# Patient Record
Sex: Female | Born: 1950 | Race: White | Hispanic: No | Marital: Married | Smoking: Former smoker
Health system: Southern US, Community
[De-identification: ages and names within clinical notes are randomized; demographics above are authoritative.]

## PROBLEM LIST (undated history)

## (undated) DIAGNOSIS — R42 Dizziness and giddiness: Secondary | ICD-10-CM

## (undated) DIAGNOSIS — Z8489 Family history of other specified conditions: Secondary | ICD-10-CM

## (undated) DIAGNOSIS — I10 Essential (primary) hypertension: Secondary | ICD-10-CM

## (undated) DIAGNOSIS — E119 Type 2 diabetes mellitus without complications: Secondary | ICD-10-CM

## (undated) DIAGNOSIS — J449 Chronic obstructive pulmonary disease, unspecified: Secondary | ICD-10-CM

## (undated) HISTORY — PX: BLEPHAROPLASTY: SUR158

## (undated) HISTORY — PX: ABDOMINAL HYSTERECTOMY: SHX81

## (undated) HISTORY — PX: BACK SURGERY: SHX140

## (undated) HISTORY — PX: CATARACT EXTRACTION W/ INTRAOCULAR LENS  IMPLANT, BILATERAL: SHX1307

## (undated) HISTORY — PX: BREAST SURGERY: SHX581

## (undated) HISTORY — PX: HIP SURGERY: SHX245

---

## 2004-01-31 ENCOUNTER — Ambulatory Visit: Payer: Self-pay | Admitting: Unknown Physician Specialty

## 2004-11-01 ENCOUNTER — Ambulatory Visit: Payer: Self-pay | Admitting: General Practice

## 2005-03-11 ENCOUNTER — Ambulatory Visit: Payer: Self-pay | Admitting: General Practice

## 2005-11-19 ENCOUNTER — Ambulatory Visit: Payer: Self-pay | Admitting: Internal Medicine

## 2006-03-24 ENCOUNTER — Ambulatory Visit: Payer: Self-pay | Admitting: General Practice

## 2006-03-31 ENCOUNTER — Ambulatory Visit: Payer: Self-pay | Admitting: General Practice

## 2007-03-30 ENCOUNTER — Ambulatory Visit: Payer: Self-pay | Admitting: Internal Medicine

## 2008-03-31 ENCOUNTER — Ambulatory Visit: Payer: Self-pay | Admitting: Internal Medicine

## 2008-10-02 ENCOUNTER — Ambulatory Visit: Payer: Self-pay | Admitting: Unknown Physician Specialty

## 2008-11-02 ENCOUNTER — Ambulatory Visit: Payer: Self-pay | Admitting: Unknown Physician Specialty

## 2009-04-02 ENCOUNTER — Ambulatory Visit: Payer: Self-pay | Admitting: Internal Medicine

## 2010-04-04 ENCOUNTER — Ambulatory Visit: Payer: Self-pay | Admitting: Internal Medicine

## 2011-11-14 ENCOUNTER — Ambulatory Visit: Payer: Self-pay | Admitting: Anesthesiology

## 2011-11-18 ENCOUNTER — Ambulatory Visit: Payer: Self-pay

## 2012-03-18 ENCOUNTER — Ambulatory Visit: Payer: Self-pay | Admitting: Internal Medicine

## 2012-03-24 ENCOUNTER — Ambulatory Visit: Payer: Self-pay | Admitting: Ophthalmology

## 2012-04-21 ENCOUNTER — Ambulatory Visit: Payer: Self-pay | Admitting: Ophthalmology

## 2013-03-29 ENCOUNTER — Ambulatory Visit: Payer: Self-pay | Admitting: Internal Medicine

## 2014-04-04 ENCOUNTER — Ambulatory Visit: Payer: Self-pay | Admitting: Internal Medicine

## 2016-07-01 ENCOUNTER — Telehealth: Payer: Self-pay

## 2016-07-01 ENCOUNTER — Other Ambulatory Visit: Payer: Self-pay

## 2016-07-01 DIAGNOSIS — Z86010 Personal history of colonic polyps: Secondary | ICD-10-CM

## 2016-07-01 NOTE — Telephone Encounter (Signed)
Gastroenterology Pre-Procedure Review  Request Date: 07/14/16 Requesting Physician: Dr. Allen Norris  PATIENT REVIEW QUESTIONS: The patient responded to the following health history questions as indicated:    1. Are you having any GI issues? no 2. Do you have a personal history of Polyps? yes (removed) 3. Do you have a family history of Colon Cancer or Polyps? no 4. Diabetes Mellitus? yes (Type II) 5. Joint replacements in the past 12 months?no 6. Major health problems in the past 3 months?no 7. Any artificial heart valves, MVP, or defibrillator?no    MEDICATIONS & ALLERGIES:    Patient reports the following regarding taking any anticoagulation/antiplatelet therapy:   Plavix, Coumadin, Eliquis, Xarelto, Lovenox, Pradaxa, Brilinta, or Effient? no Aspirin? no  Patient confirms/reports the following medications:  Current Outpatient Prescriptions  Medication Sig Dispense Refill  . alendronate (FOSAMAX) 70 MG tablet TAKE 1 TABLET (70 MG TOTAL) BY MOUTH EVERY 7 (SEVEN) DAYS.    Marland Kitchen ALPRAZolam (XANAX) 0.5 MG tablet Take by mouth.    . fluticasone (FLONASE) 50 MCG/ACT nasal spray Place into the nose.    Marland Kitchen Fluticasone-Salmeterol (ADVAIR DISKUS) 250-50 MCG/DOSE AEPB Inhale into the lungs.    . Fluticasone-Salmeterol 113-14 MCG/ACT AEPB Inhale into the lungs.    . Ipratropium-Albuterol (COMBIVENT RESPIMAT) 20-100 MCG/ACT AERS respimat INHALE 1 INHALATION INTO THE LUNGS 4 (FOUR) TIMES DAILY AS NEEDED FOR WHEEZING.    Marland Kitchen ipratropium-albuterol (DUONEB) 0.5-2.5 (3) MG/3ML SOLN Inhale into the lungs.    . metFORMIN (GLUCOPHAGE) 1000 MG tablet TAKE 1 TABLET BY MOUTH TWICE A DAY    . tiotropium (SPIRIVA) 18 MCG inhalation capsule Place into inhaler and inhale.    . valsartan-hydrochlorothiazide (DIOVAN-HCT) 160-12.5 MG tablet TAKE 1 TABLET BY MOUTH ONCE A DAY     No current facility-administered medications for this visit.     Patient confirms/reports the following allergies:  Allergies  Allergen Reactions   . Sulfamethoxazole-Trimethoprim Other (See Comments)    Sulfa causes tingling  . Morphine     Other reaction(s): Other (See Comments) Morphine causes mind altering.  . Oxycodone-Acetaminophen     Other reaction(s): Other (See Comments) Percocet causes mind altering.  . Sertraline Other (See Comments)    Sertraline causes worsening depression.    No orders of the defined types were placed in this encounter.   AUTHORIZATION INFORMATION Primary Insurance: 1D#: Group #:  Secondary Insurance: 1D#: Group #:  SCHEDULE INFORMATION: Date: 07/14/16 Time: Location: [place]

## 2016-07-08 ENCOUNTER — Encounter: Payer: Self-pay | Admitting: *Deleted

## 2016-07-10 NOTE — Discharge Instructions (Signed)

## 2016-07-14 ENCOUNTER — Ambulatory Visit: Payer: Medicare Other | Admitting: Anesthesiology

## 2016-07-14 ENCOUNTER — Ambulatory Visit
Admission: RE | Admit: 2016-07-14 | Discharge: 2016-07-14 | Disposition: A | Discharge status: Home / Self Care | Payer: Medicare Other | Source: Ambulatory Visit | Attending: Gastroenterology | Admitting: Gastroenterology

## 2016-07-14 ENCOUNTER — Encounter
Admission: RE | Disposition: A | Discharge status: Home / Self Care | Payer: Self-pay | Source: Ambulatory Visit | Attending: Gastroenterology

## 2016-07-14 DIAGNOSIS — Z7984 Long term (current) use of oral hypoglycemic drugs: Secondary | ICD-10-CM | POA: Insufficient documentation

## 2016-07-14 DIAGNOSIS — Z860100 Personal history of colon polyps, unspecified: Secondary | ICD-10-CM

## 2016-07-14 DIAGNOSIS — Z79899 Other long term (current) drug therapy: Secondary | ICD-10-CM | POA: Diagnosis not present

## 2016-07-14 DIAGNOSIS — Z1211 Encounter for screening for malignant neoplasm of colon: Secondary | ICD-10-CM | POA: Diagnosis not present

## 2016-07-14 DIAGNOSIS — D127 Benign neoplasm of rectosigmoid junction: Secondary | ICD-10-CM

## 2016-07-14 DIAGNOSIS — K5730 Diverticulosis of large intestine without perforation or abscess without bleeding: Secondary | ICD-10-CM | POA: Insufficient documentation

## 2016-07-14 DIAGNOSIS — J449 Chronic obstructive pulmonary disease, unspecified: Secondary | ICD-10-CM | POA: Insufficient documentation

## 2016-07-14 DIAGNOSIS — E119 Type 2 diabetes mellitus without complications: Secondary | ICD-10-CM | POA: Diagnosis not present

## 2016-07-14 DIAGNOSIS — Z87891 Personal history of nicotine dependence: Secondary | ICD-10-CM | POA: Diagnosis not present

## 2016-07-14 DIAGNOSIS — I10 Essential (primary) hypertension: Secondary | ICD-10-CM | POA: Insufficient documentation

## 2016-07-14 DIAGNOSIS — Z9981 Dependence on supplemental oxygen: Secondary | ICD-10-CM | POA: Diagnosis not present

## 2016-07-14 HISTORY — DX: Essential (primary) hypertension: I10

## 2016-07-14 HISTORY — PX: POLYPECTOMY: SHX5525

## 2016-07-14 HISTORY — DX: Chronic obstructive pulmonary disease, unspecified: J44.9

## 2016-07-14 HISTORY — DX: Family history of other specified conditions: Z84.89

## 2016-07-14 HISTORY — PX: COLONOSCOPY WITH PROPOFOL: SHX5780

## 2016-07-14 HISTORY — DX: Type 2 diabetes mellitus without complications: E11.9

## 2016-07-14 HISTORY — DX: Dizziness and giddiness: R42

## 2016-07-14 LAB — GLUCOSE, CAPILLARY
Glucose-Capillary: 135 mg/dL — ABNORMAL HIGH (ref 65–99)
Glucose-Capillary: 132 mg/dL — ABNORMAL HIGH (ref 65–99)

## 2016-07-14 SURGERY — COLONOSCOPY WITH PROPOFOL
Anesthesia: Monitor Anesthesia Care | Wound class: Contaminated

## 2016-07-14 MED ORDER — ONDANSETRON HCL 4 MG/2ML IJ SOLN: 4.0000 mg | Freq: Once | INTRAMUSCULAR | Status: DC | PRN

## 2016-07-14 MED ORDER — PROPOFOL 10 MG/ML IV BOLUS
INTRAVENOUS | Status: DC | PRN
  Administered 2016-07-14: 10 mg via INTRAVENOUS
  Administered 2016-07-14 (×5): 20 mg via INTRAVENOUS
  Administered 2016-07-14: 10 mg via INTRAVENOUS
  Administered 2016-07-14: 40 mg via INTRAVENOUS
  Administered 2016-07-14 (×2): 20 mg via INTRAVENOUS

## 2016-07-14 MED ORDER — LACTATED RINGERS IV SOLN
INTRAVENOUS | Status: DC | PRN
  Administered 2016-07-14: 10:00:00 via INTRAVENOUS

## 2016-07-14 MED ORDER — LIDOCAINE HCL (CARDIAC) 20 MG/ML IV SOLN
INTRAVENOUS | Status: DC | PRN
  Administered 2016-07-14: 40 mg via INTRAVENOUS

## 2016-07-14 MED ORDER — STERILE WATER FOR IRRIGATION IR SOLN
Status: DC | PRN
  Administered 2016-07-14: 10:00:00

## 2016-07-14 SURGICAL SUPPLY — 23 items

## 2016-07-14 NOTE — Op Note (Signed)
[facility] Gastroenterology Patient Name: Belinda Jenkins Procedure Date: 07/14/2016 10:00 AM MRN: [id number] Account #: [id number] Date of Birth: 12-Sep-1950 Admit Type: Outpatient Age: 66 Room: [facility] OR ROOM 01 Gender: Female Note Status: Finalized Procedure:            Colonoscopy Indications:          High risk colon cancer surveillance: Personal history                        of colonic polyps Providers:            Lucilla Lame MD, MD Referring MD:         Halina Maidens, MD (Referring MD) Medicines:            Propofol per Anesthesia Complications:        No immediate complications. Procedure:            Pre-Anesthesia Assessment:                       - Prior to the procedure, a History and Physical was                        performed, and patient medications and allergies were                        reviewed. The patient's tolerance of previous                        anesthesia was also reviewed. The risks and benefits of                        the procedure and the sedation options and risks were                        discussed with the patient. All questions were                        answered, and informed consent was obtained. Prior                        Anticoagulants: The patient has taken no previous                        anticoagulant or antiplatelet agents. ASA Grade                        Assessment: II - A patient with mild systemic disease.                        After reviewing the risks and benefits, the patient was                        deemed in satisfactory condition to undergo the                        procedure.                       After obtaining informed consent, the colonoscope was  passed under direct vision. Throughout the procedure,                        the patient's blood pressure, pulse, and oxygen                        saturations were monitored continuously. The Olympus CF   H180AL Colonoscope (S#: [id number]) was introduced through                        the anus and advanced to the the cecum, identified by                        appendiceal orifice and ileocecal valve. The                        colonoscopy was performed without difficulty. The                        patient tolerated the procedure well. The quality of                        the bowel preparation was excellent. Findings:      The perianal and digital rectal examinations were normal.      Four sessile polyps were found in the recto-sigmoid colon. The polyps       were 2 to 3 mm in size. These polyps were removed with a cold biopsy       forceps. Resection and retrieval were complete.      Multiple small-mouthed diverticula were found in the sigmoid colon. Impression:           - Four 2 to 3 mm polyps at the recto-sigmoid colon,                        removed with a cold biopsy forceps. Resected and                        retrieved.                       - Diverticulosis in the sigmoid colon. Recommendation:       - Discharge patient to home.                       - Resume previous diet.                       - Continue present medications.                       - Await pathology results.                       - Repeat colonoscopy in 5 years for surveillance. Procedure Code(s):    --- Professional ---                       [phone], Colonoscopy, flexible; with biopsy, single or                        multiple Diagnosis Code(s):    --- Professional ---  Z86.010, Personal history of colonic polyps                       D12.7, Benign neoplasm of rectosigmoid junction CPT copyright 2016 American Medical Association. All rights reserved. The codes documented in this report are preliminary and upon coder review may  be revised to meet current compliance requirements. Lucilla Lame MD, MD 07/14/2016 10:30:07 AM This report has been signed electronically. Number of Addenda: 0 Note  Initiated On: 07/14/2016 10:00 AM Scope Withdrawal Time: 0 hours 6 minutes 22 seconds  Total Procedure Duration: 0 hours 13 minutes 56 seconds       [facility]

## 2016-07-14 NOTE — H&P (Signed)
Lucilla Lame, MD [address] [place], [place] [address] Phone:[phone] Fax : [phone]  Primary Care Physician:  No PCP Per Patient Primary Gastroenterologist:  Dr. Allen Norris  Pre-Procedure History & Physical: HPI:  Belinda Jenkins is a 66 y.o. female is here for an colonoscopy.   Past Medical History:  Diagnosis Date  . COPD (chronic obstructive pulmonary disease) ([place])   . Diabetes mellitus without complication ([place])   . Family history of adverse reaction to anesthesia    sister - slow to wake after 1 procedure  . Hypertension   . Vertigo     Past Surgical History:  Procedure Laterality Date  . ABDOMINAL HYSTERECTOMY    . BACK SURGERY     L5  . BLEPHAROPLASTY Bilateral   . BREAST SURGERY     lumpectomy(x1), milk duct(x1)  . CATARACT EXTRACTION W/ INTRAOCULAR LENS  IMPLANT, BILATERAL    . HIP SURGERY Left     Prior to Admission medications   Medication Sig Start Date End Date Taking? Authorizing Provider  alendronate (FOSAMAX) 70 MG tablet TAKE 1 TABLET (70 MG TOTAL) BY MOUTH EVERY 7 (SEVEN) DAYS. 05/21/15  Yes Historical Provider, MD  ALPRAZolam Duanne Moron) 0.5 MG tablet Take by mouth. 05/29/16  Yes Historical Provider, MD  fluticasone (FLONASE) 50 MCG/ACT nasal spray Place into the nose. 03/28/16 03/28/17 Yes Historical Provider, MD  Fluticasone-Salmeterol (ADVAIR DISKUS) 250-50 MCG/DOSE AEPB Inhale into the lungs. 12/06/15  Yes Historical Provider, MD  Ipratropium-Albuterol (COMBIVENT RESPIMAT) 20-100 MCG/ACT AERS respimat INHALE 1 INHALATION INTO THE LUNGS 4 (FOUR) TIMES DAILY AS NEEDED FOR WHEEZING. 11/29/15  Yes Historical Provider, MD  ipratropium-albuterol (DUONEB) 0.5-2.5 (3) MG/3ML SOLN Inhale into the lungs. 09/25/15  Yes Historical Provider, MD  metFORMIN (GLUCOPHAGE) 1000 MG tablet TAKE 1 TABLET BY MOUTH TWICE A DAY 09/19/15  Yes Historical Provider, MD  OXYGEN Inhale 2 L into the lungs continuous.   Yes Historical Provider, MD  tiotropium (SPIRIVA)  18 MCG inhalation capsule Place into inhaler and inhale. 12/06/15  Yes Historical Provider, MD  valsartan-hydrochlorothiazide (DIOVAN-HCT) 160-12.5 MG tablet TAKE 1 TABLET BY MOUTH ONCE A DAY 10/17/15  Yes Historical Provider, MD    Allergies as of 07/01/2016 - never reviewed  Allergen Reaction Noted  . Sulfamethoxazole-trimethoprim Other (See Comments)   . Morphine  08/23/2013  . Oxycodone-acetaminophen  08/23/2013  . Sertraline Other (See Comments) 08/23/2013    History reviewed. No pertinent family history.  Social History   Social History  . Marital status: Married    Spouse name: N/A  . Number of children: N/A  . Years of education: N/A   Occupational History  . Not on file.   Social History Main Topics  . Smoking status: Former Smoker    Quit date: 04/15/2003  . Smokeless tobacco: Never Used  . Alcohol use 0.6 oz/week    1 Shots of liquor per week  . Drug use: Unknown  . Sexual activity: Not on file   Other Topics Concern  . Not on file   Social History Narrative  . No narrative on file    Review of Systems: See HPI, otherwise negative ROS  Physical Exam: BP 118/79   Pulse 89   Temp 98.1 F (36.7 C) (Temporal)   Resp 16   Ht 5\' 1"  (1.549 m)   Wt 153 lb (69.4 kg)   SpO2 100%   BMI 28.91 kg/m  General:   Alert,  pleasant and cooperative in NAD Head:  Normocephalic and  atraumatic. Neck:  Supple; no masses or thyromegaly. Lungs:  Diffuse rhonichi    Heart:  Regular rate and rhythm. Abdomen:  Soft, nontender and nondistended. Normal bowel sounds, without guarding, and without rebound.   Neurologic:  Alert and  oriented x4;  grossly normal neurologically.  Impression/Plan: Belinda Jenkins is here for an colonoscopy to be performed for histroy of polyps  Risks, benefits, limitations, and alternatives regarding  endoscopy have been reviewed with the patient.  Questions have been answered.  All parties agreeable.   Lucilla Lame, MD  07/14/2016, 9:34 AM

## 2016-07-14 NOTE — Anesthesia Preprocedure Evaluation (Addendum)
Anesthesia Evaluation  Patient identified by MRN, date of birth, ID band Patient awake    Reviewed: Allergy & Precautions, H&P , NPO status , Patient's Chart, lab work & pertinent test results, reviewed documented beta blocker date and time   Airway Mallampati: II  TM Distance: >3 FB     Dental no notable dental hx.    Pulmonary COPD,  oxygen dependent, former smoker,    breath sounds clear to auscultation       Cardiovascular hypertension, (-) angina Rhythm:regular     Neuro/Psych    GI/Hepatic negative GI ROS,   Endo/Other  diabetes, Type 2, Oral Hypoglycemic Agents  Renal/GU      Musculoskeletal   Abdominal   Peds  Hematology negative hematology ROS (+)   Anesthesia Other Findings Nasal cannula O2 in place  Reproductive/Obstetrics                           Anesthesia Physical Anesthesia Plan  ASA: III  Anesthesia Plan: MAC   Post-op Pain Management:    Induction:   Airway Management Planned:   Additional Equipment:   Intra-op Plan:   Post-operative Plan:   Informed Consent: I have reviewed the patients History and Physical, chart, labs and discussed the procedure including the risks, benefits and alternatives for the proposed anesthesia with the patient or authorized representative who has indicated his/her understanding and acceptance.     Plan Discussed with: CRNA  Anesthesia Plan Comments:       Anesthesia Quick Evaluation

## 2016-07-14 NOTE — Anesthesia Postprocedure Evaluation (Signed)
Anesthesia Post Note  Patient: Belinda Jenkins  Procedure(s) Performed: Procedure(s) (LRB): COLONOSCOPY WITH PROPOFOL (N/A) POLYPECTOMY  Patient location during evaluation: PACU Anesthesia Type: MAC Level of consciousness: awake and alert Pain management: pain level controlled Vital Signs Assessment: post-procedure vital signs reviewed and stable Respiratory status: spontaneous breathing, nonlabored ventilation, respiratory function stable and patient connected to nasal cannula oxygen Cardiovascular status: stable and blood pressure returned to baseline Anesthetic complications: no    Veda Canning

## 2016-07-14 NOTE — Anesthesia Procedure Notes (Signed)
Procedure Name: MAC Date/Time: 07/14/2016 10:09 AM Performed by: Janna Arch Pre-anesthesia Checklist: Patient identified, Emergency Drugs available, Suction available and Patient being monitored Patient Re-evaluated:Patient Re-evaluated prior to inductionOxygen Delivery Method: Nasal cannula

## 2016-07-14 NOTE — Transfer of Care (Signed)
Immediate Anesthesia Transfer of Care Note  Patient: Belinda Jenkins  Procedure(s) Performed: Procedure(s) with comments: COLONOSCOPY WITH PROPOFOL (N/A) - Diabetic - oral meds requests arrival around 8 AM POLYPECTOMY  Patient Location: PACU  Anesthesia Type: MAC  Level of Consciousness: awake, alert  and patient cooperative  Airway and Oxygen Therapy: Patient Spontanous Breathing and Patient connected to supplemental oxygen  Post-op Assessment: Post-op Vital signs reviewed, Patient's Cardiovascular Status Stable, Respiratory Function Stable, Patent Airway and No signs of Nausea or vomiting  Post-op Vital Signs: Reviewed and stable  Complications: No apparent anesthesia complications

## 2016-07-15 ENCOUNTER — Encounter: Payer: Self-pay | Admitting: Gastroenterology

## 2016-07-17 ENCOUNTER — Encounter: Payer: Self-pay | Admitting: Gastroenterology

## 2018-02-08 ENCOUNTER — Inpatient Hospital Stay: Payer: Self-pay

## 2018-02-08 ENCOUNTER — Emergency Department: Payer: Medicare Other

## 2018-02-08 ENCOUNTER — Inpatient Hospital Stay
Admission: EM | Admit: 2018-02-08 | Discharge: 2018-02-12 | DRG: 208 | Disposition: A | Discharge status: Home Health | Payer: Medicare Other | Attending: Internal Medicine | Admitting: Internal Medicine

## 2018-02-08 ENCOUNTER — Inpatient Hospital Stay
Admit: 2018-02-08 | Discharge: 2018-02-08 | Disposition: A | Discharge status: Home / Self Care | Payer: Medicare Other | Attending: Internal Medicine | Admitting: Internal Medicine

## 2018-02-08 ENCOUNTER — Inpatient Hospital Stay: Payer: Medicare Other

## 2018-02-08 ENCOUNTER — Encounter: Payer: Self-pay | Admitting: Radiology

## 2018-02-08 DIAGNOSIS — E875 Hyperkalemia: Secondary | ICD-10-CM | POA: Diagnosis present

## 2018-02-08 DIAGNOSIS — Z9842 Cataract extraction status, left eye: Secondary | ICD-10-CM

## 2018-02-08 DIAGNOSIS — E1165 Type 2 diabetes mellitus with hyperglycemia: Secondary | ICD-10-CM | POA: Diagnosis not present

## 2018-02-08 DIAGNOSIS — R140 Abdominal distension (gaseous): Secondary | ICD-10-CM

## 2018-02-08 DIAGNOSIS — J9690 Respiratory failure, unspecified, unspecified whether with hypoxia or hypercapnia: Secondary | ICD-10-CM

## 2018-02-08 DIAGNOSIS — J9602 Acute respiratory failure with hypercapnia: Secondary | ICD-10-CM

## 2018-02-08 DIAGNOSIS — J441 Chronic obstructive pulmonary disease with (acute) exacerbation: Secondary | ICD-10-CM

## 2018-02-08 DIAGNOSIS — I959 Hypotension, unspecified: Secondary | ICD-10-CM

## 2018-02-08 DIAGNOSIS — Z7984 Long term (current) use of oral hypoglycemic drugs: Secondary | ICD-10-CM

## 2018-02-08 DIAGNOSIS — J440 Chronic obstructive pulmonary disease with acute lower respiratory infection: Secondary | ICD-10-CM | POA: Diagnosis present

## 2018-02-08 DIAGNOSIS — J9601 Acute respiratory failure with hypoxia: Secondary | ICD-10-CM | POA: Diagnosis not present

## 2018-02-08 DIAGNOSIS — Z90710 Acquired absence of both cervix and uterus: Secondary | ICD-10-CM

## 2018-02-08 DIAGNOSIS — Z79899 Other long term (current) drug therapy: Secondary | ICD-10-CM | POA: Diagnosis not present

## 2018-02-08 DIAGNOSIS — Z885 Allergy status to narcotic agent status: Secondary | ICD-10-CM | POA: Diagnosis not present

## 2018-02-08 DIAGNOSIS — Z9981 Dependence on supplemental oxygen: Secondary | ICD-10-CM

## 2018-02-08 DIAGNOSIS — Z9841 Cataract extraction status, right eye: Secondary | ICD-10-CM | POA: Diagnosis not present

## 2018-02-08 DIAGNOSIS — J9811 Atelectasis: Secondary | ICD-10-CM | POA: Diagnosis present

## 2018-02-08 DIAGNOSIS — Z7951 Long term (current) use of inhaled steroids: Secondary | ICD-10-CM

## 2018-02-08 DIAGNOSIS — J9621 Acute and chronic respiratory failure with hypoxia: Principal | ICD-10-CM | POA: Diagnosis present

## 2018-02-08 DIAGNOSIS — I10 Essential (primary) hypertension: Secondary | ICD-10-CM | POA: Diagnosis present

## 2018-02-08 DIAGNOSIS — Z961 Presence of intraocular lens: Secondary | ICD-10-CM | POA: Diagnosis present

## 2018-02-08 DIAGNOSIS — Z87891 Personal history of nicotine dependence: Secondary | ICD-10-CM

## 2018-02-08 DIAGNOSIS — Z882 Allergy status to sulfonamides status: Secondary | ICD-10-CM | POA: Diagnosis not present

## 2018-02-08 DIAGNOSIS — J449 Chronic obstructive pulmonary disease, unspecified: Secondary | ICD-10-CM

## 2018-02-08 DIAGNOSIS — Z23 Encounter for immunization: Secondary | ICD-10-CM

## 2018-02-08 DIAGNOSIS — Z9911 Dependence on respirator [ventilator] status: Secondary | ICD-10-CM

## 2018-02-08 DIAGNOSIS — Z888 Allergy status to other drugs, medicaments and biological substances status: Secondary | ICD-10-CM

## 2018-02-08 DIAGNOSIS — J9622 Acute and chronic respiratory failure with hypercapnia: Secondary | ICD-10-CM | POA: Diagnosis not present

## 2018-02-08 DIAGNOSIS — F419 Anxiety disorder, unspecified: Secondary | ICD-10-CM | POA: Diagnosis present

## 2018-02-08 DIAGNOSIS — D649 Anemia, unspecified: Secondary | ICD-10-CM | POA: Diagnosis present

## 2018-02-08 DIAGNOSIS — R4182 Altered mental status, unspecified: Secondary | ICD-10-CM | POA: Diagnosis present

## 2018-02-08 DIAGNOSIS — J189 Pneumonia, unspecified organism: Secondary | ICD-10-CM

## 2018-02-08 LAB — BASIC METABOLIC PANEL
Anion gap: 7 (ref 5–15)
BUN: 19 mg/dL (ref 8–23)
CHLORIDE: 99 mmol/L (ref 98–111)
CO2: 35 mmol/L — ABNORMAL HIGH (ref 22–32)
CREATININE: 0.65 mg/dL (ref 0.44–1.00)
Calcium: 8.7 mg/dL — ABNORMAL LOW (ref 8.9–10.3)
GFR calc Af Amer: >60 mL/min (ref >60)
GFR calc non Af Amer: >60 mL/min (ref >60)
Glucose, Bld: 218 mg/dL — ABNORMAL HIGH (ref 70–99)
POTASSIUM: 4.8 mmol/L (ref 3.5–5.1)
SODIUM: 141 mmol/L (ref 135–145)

## 2018-02-08 LAB — BLOOD GAS, ARTERIAL
Acid-Base Excess: 1.9 mmol/L (ref 0.0–2.0)
Acid-Base Excess: 8.9 mmol/L — ABNORMAL HIGH (ref 0.0–2.0)
BICARBONATE: 34.5 mmol/L — AB (ref 20.0–28.0)
Bicarbonate: 32.1 mmol/L — ABNORMAL HIGH (ref 20.0–28.0)
FIO2: 0.35
FIO2: 0.4
MECHVT: 450 mL
MECHVT: 500 mL
O2 Saturation: 97 %
O2 Saturation: 99.5 %
PEEP: 5 cmH2O
PEEP: 5 cmH2O
PH ART: 7.43 (ref 7.350–7.450)
PO2 ART: 161 mmHg — AB (ref 83.0–108.0)
Patient temperature: 37
Patient temperature: 37
RATE: 16 {breaths}/min
RATE: 10 resp/min
pCO2 arterial: 86 mmHg (ref 32.0–48.0)
pCO2 arterial: 52 mmHg — ABNORMAL HIGH (ref 32.0–48.0)
pH, Arterial: 7.18 — CL (ref 7.350–7.450)
pO2, Arterial: 110 mmHg — ABNORMAL HIGH (ref 83.0–108.0)

## 2018-02-08 LAB — CBC WITH DIFFERENTIAL/PLATELET
Abs Immature Granulocytes: 0.02 K/uL (ref 0.00–0.07)
Basophils Absolute: 0 K/uL (ref 0.0–0.1)
Basophils Relative: 1 %
Eosinophils Absolute: 0 K/uL (ref 0.0–0.5)
Eosinophils Relative: 0 %
HCT: 42.8 % (ref 36.0–46.0)
Hemoglobin: 12 g/dL (ref 12.0–15.0)
Immature Granulocytes: 0 %
Lymphocytes Relative: 22 %
Lymphs Abs: 1.3 K/uL (ref 0.7–4.0)
MCH: 27.1 pg (ref 26.0–34.0)
MCHC: 28 g/dL — ABNORMAL LOW (ref 30.0–36.0)
MCV: 96.6 fL (ref 80.0–100.0)
Monocytes Absolute: 0.5 K/uL (ref 0.1–1.0)
Monocytes Relative: 9 %
Neutro Abs: 4 K/uL (ref 1.7–7.7)
Neutrophils Relative %: 68 %
Platelets: 328 K/uL (ref 150–400)
RBC: 4.43 MIL/uL (ref 3.87–5.11)
RDW: 13.1 % (ref 11.5–15.5)
WBC: 5.9 K/uL (ref 4.0–10.5)
nRBC: 0 % (ref 0.0–0.2)

## 2018-02-08 LAB — BASIC METABOLIC PANEL WITH GFR
Anion gap: 13 (ref 5–15)
BUN: 20 mg/dL (ref 8–23)
CO2: 36 mmol/L — ABNORMAL HIGH (ref 22–32)
Calcium: 9.8 mg/dL (ref 8.9–10.3)
Chloride: 90 mmol/L — ABNORMAL LOW (ref 98–111)
Creatinine, Ser: 0.69 mg/dL (ref 0.44–1.00)
GFR calc Af Amer: >60 mL/min
GFR calc non Af Amer: >60 mL/min
Glucose, Bld: 373 mg/dL — ABNORMAL HIGH (ref 70–99)
Potassium: 5.4 mmol/L — ABNORMAL HIGH (ref 3.5–5.1)
Sodium: 139 mmol/L (ref 135–145)

## 2018-02-08 LAB — TROPONIN I
Troponin I: 0.03 ng/mL (ref <0.03)
Troponin I: 0.03 ng/mL (ref <0.03)

## 2018-02-08 LAB — BRAIN NATRIURETIC PEPTIDE
B NATRIURETIC PEPTIDE 5: 45 pg/mL (ref 0.0–100.0)
B Natriuretic Peptide: 118 pg/mL — ABNORMAL HIGH (ref 0.0–100.0)

## 2018-02-08 LAB — GLUCOSE, CAPILLARY
GLUCOSE-CAPILLARY: 307 mg/dL — AB (ref 70–99)
GLUCOSE-CAPILLARY: 217 mg/dL — AB (ref 70–99)
Glucose-Capillary: 88 mg/dL (ref 70–99)
Glucose-Capillary: 131 mg/dL — ABNORMAL HIGH (ref 70–99)
Glucose-Capillary: 69 mg/dL — ABNORMAL LOW (ref 70–99)
Glucose-Capillary: 118 mg/dL — ABNORMAL HIGH (ref 70–99)

## 2018-02-08 LAB — MRSA PCR SCREENING: MRSA by PCR: NEGATIVE

## 2018-02-08 LAB — PHOSPHORUS: PHOSPHORUS: 4.5 mg/dL (ref 2.5–4.6)

## 2018-02-08 LAB — PROCALCITONIN: Procalcitonin: <0.1 ng/mL

## 2018-02-08 LAB — FIBRIN DERIVATIVES D-DIMER (ARMC ONLY): Fibrin derivatives D-dimer (ARMC): 515.03 ng{FEU}/mL — ABNORMAL HIGH (ref 0.00–499.00)

## 2018-02-08 LAB — SAMPLE TO BLOOD BANK

## 2018-02-08 LAB — MAGNESIUM: MAGNESIUM: 1.6 mg/dL — AB (ref 1.7–2.4)

## 2018-02-08 LAB — TRIGLYCERIDES: TRIGLYCERIDES: 102 mg/dL (ref <150)

## 2018-02-08 MED ORDER — SODIUM CHLORIDE 0.9 % IV SOLN
1.0000 g | Freq: Once | INTRAVENOUS | Status: AC
  Administered 2018-02-08: 1 g via INTRAVENOUS
  Filled 2018-02-08: qty 1

## 2018-02-08 MED ORDER — IPRATROPIUM-ALBUTEROL 0.5-2.5 (3) MG/3ML IN SOLN: 3.0000 mL | Freq: Four times a day (QID) | RESPIRATORY_TRACT | Status: DC

## 2018-02-08 MED ORDER — DEXTROSE-NACL 5-0.9 % IV SOLN
INTRAVENOUS | Status: DC
  Administered 2018-02-08: 20:00:00 via INTRAVENOUS

## 2018-02-08 MED ORDER — CHLORHEXIDINE GLUCONATE 0.12% ORAL RINSE (MEDLINE KIT)
15.0000 mL | Freq: Two times a day (BID) | OROMUCOSAL | Status: DC
  Administered 2018-02-08 – 2018-02-09 (×2): 15 mL via OROMUCOSAL

## 2018-02-08 MED ORDER — PROPOFOL 1000 MG/100ML IV EMUL
INTRAVENOUS | Status: AC
  Administered 2018-02-08: 5 ug/kg/min via INTRAVENOUS
  Filled 2018-02-08: qty 100

## 2018-02-08 MED ORDER — SODIUM CHLORIDE 0.9 % IV SOLN
1.0000 g | INTRAVENOUS | Status: DC
  Filled 2018-02-08: qty 10

## 2018-02-08 MED ORDER — LORAZEPAM 2 MG/ML IJ SOLN
1.0000 mg | Freq: Once | INTRAMUSCULAR | Status: AC
  Administered 2018-02-08: 1 mg via INTRAVENOUS
  Filled 2018-02-08: qty 1

## 2018-02-08 MED ORDER — ONDANSETRON HCL 4 MG PO TABS: 4.0000 mg | ORAL_TABLET | Freq: Four times a day (QID) | ORAL | Status: DC | PRN

## 2018-02-08 MED ORDER — DEXTROSE 50 % IV SOLN
25.0000 mL | Freq: Once | INTRAVENOUS | Status: AC
  Administered 2018-02-08: 25 mL via INTRAVENOUS

## 2018-02-08 MED ORDER — FENTANYL CITRATE (PF) 100 MCG/2ML IJ SOLN
50.0000 ug | INTRAMUSCULAR | Status: DC | PRN
  Administered 2018-02-08 – 2018-02-09 (×2): 50 ug via INTRAVENOUS

## 2018-02-08 MED ORDER — LEVALBUTEROL HCL 1.25 MG/0.5ML IN NEBU: 1.2500 mg | INHALATION_SOLUTION | Freq: Four times a day (QID) | RESPIRATORY_TRACT | Status: DC

## 2018-02-08 MED ORDER — SODIUM CHLORIDE 0.9 % IV BOLUS: 1000.0000 mL | INTRAVENOUS | Status: AC

## 2018-02-08 MED ORDER — HYDRALAZINE HCL 20 MG/ML IJ SOLN: 10.0000 mg | Freq: Four times a day (QID) | INTRAMUSCULAR | Status: DC | PRN

## 2018-02-08 MED ORDER — SUCCINYLCHOLINE CHLORIDE 20 MG/ML IJ SOLN
INTRAMUSCULAR | Status: AC | PRN
  Administered 2018-02-08: 100 mg via INTRAVENOUS

## 2018-02-08 MED ORDER — LABETALOL HCL 5 MG/ML IV SOLN: 10.0000 mg | Freq: Four times a day (QID) | INTRAVENOUS | Status: DC | PRN

## 2018-02-08 MED ORDER — PROPOFOL 1000 MG/100ML IV EMUL
0.0000 ug/kg/min | INTRAVENOUS | Status: DC
  Administered 2018-02-08: 40 ug/kg/min via INTRAVENOUS
  Administered 2018-02-08: 20 ug/kg/min via INTRAVENOUS
  Administered 2018-02-09: 25 ug/kg/min via INTRAVENOUS
  Filled 2018-02-08 (×2): qty 100

## 2018-02-08 MED ORDER — ACETAMINOPHEN 650 MG RE SUPP: 650.0000 mg | Freq: Four times a day (QID) | RECTAL | Status: DC | PRN

## 2018-02-08 MED ORDER — DEXTROSE 50 % IV SOLN
INTRAVENOUS | Status: AC
  Administered 2018-02-08: 25 mL via INTRAVENOUS
  Filled 2018-02-08: qty 50

## 2018-02-08 MED ORDER — SODIUM CHLORIDE 0.9 % IV SOLN
INTRAVENOUS | Status: DC
  Administered 2018-02-08: 15:00:00 via INTRAVENOUS

## 2018-02-08 MED ORDER — METHYLPREDNISOLONE SODIUM SUCC 40 MG IJ SOLR
40.0000 mg | Freq: Three times a day (TID) | INTRAMUSCULAR | Status: DC
  Administered 2018-02-08 – 2018-02-09 (×3): 40 mg via INTRAVENOUS
  Filled 2018-02-08 (×3): qty 1

## 2018-02-08 MED ORDER — IPRATROPIUM-ALBUTEROL 0.5-2.5 (3) MG/3ML IN SOLN
3.0000 mL | RESPIRATORY_TRACT | Status: DC
  Administered 2018-02-08: 3 mL via RESPIRATORY_TRACT

## 2018-02-08 MED ORDER — SODIUM CHLORIDE 0.9 % IV BOLUS
1000.0000 mL | Freq: Once | INTRAVENOUS | Status: AC
  Administered 2018-02-08: 1000 mL via INTRAVENOUS

## 2018-02-08 MED ORDER — IPRATROPIUM BROMIDE 0.02 % IN SOLN
0.5000 mg | Freq: Four times a day (QID) | RESPIRATORY_TRACT | Status: DC
  Administered 2018-02-08 – 2018-02-10 (×7): 0.5 mg via RESPIRATORY_TRACT
  Filled 2018-02-08 (×8): qty 2.5

## 2018-02-08 MED ORDER — SODIUM POLYSTYRENE SULFONATE 15 GM/60ML PO SUSP
30.0000 g | Freq: Once | ORAL | Status: DC
  Filled 2018-02-08: qty 120

## 2018-02-08 MED ORDER — IOHEXOL 350 MG/ML SOLN
75.0000 mL | Freq: Once | INTRAVENOUS | Status: AC | PRN
  Administered 2018-02-08: 75 mL via INTRAVENOUS

## 2018-02-08 MED ORDER — DEXTROSE 50 % IV SOLN
25.0000 mL | Freq: Once | INTRAVENOUS | Status: DC
  Filled 2018-02-08: qty 50

## 2018-02-08 MED ORDER — SODIUM CHLORIDE 0.9 % IV SOLN
1.0000 g | INTRAVENOUS | Status: DC
  Administered 2018-02-09: 1 g via INTRAVENOUS
  Filled 2018-02-08 (×2): qty 10

## 2018-02-08 MED ORDER — SODIUM CHLORIDE 0.9 % IV SOLN
0.0000 ug/min | INTRAVENOUS | Status: DC
  Filled 2018-02-08: qty 4

## 2018-02-08 MED ORDER — LEVALBUTEROL HCL 1.25 MG/0.5ML IN NEBU
1.2500 mg | INHALATION_SOLUTION | Freq: Four times a day (QID) | RESPIRATORY_TRACT | Status: DC
  Administered 2018-02-08 – 2018-02-10 (×7): 1.25 mg via RESPIRATORY_TRACT
  Filled 2018-02-08 (×8): qty 0.5

## 2018-02-08 MED ORDER — ENOXAPARIN SODIUM 40 MG/0.4ML ~~LOC~~ SOLN
40.0000 mg | SUBCUTANEOUS | Status: DC
  Administered 2018-02-08: 40 mg via SUBCUTANEOUS
  Filled 2018-02-08: qty 0.4

## 2018-02-08 MED ORDER — BUDESONIDE 0.25 MG/2ML IN SUSP
0.2500 mg | Freq: Four times a day (QID) | RESPIRATORY_TRACT | Status: DC
  Administered 2018-02-08: 0.25 mg via RESPIRATORY_TRACT

## 2018-02-08 MED ORDER — LORAZEPAM 2 MG/ML IJ SOLN
1.0000 mg | Freq: Once | INTRAMUSCULAR | Status: AC
  Administered 2018-02-08: 1 mg via INTRAVENOUS

## 2018-02-08 MED ORDER — ACETAMINOPHEN 325 MG PO TABS
650.0000 mg | ORAL_TABLET | Freq: Four times a day (QID) | ORAL | Status: DC | PRN
  Administered 2018-02-08: 650 mg
  Filled 2018-02-08: qty 2

## 2018-02-08 MED ORDER — INSULIN ASPART 100 UNIT/ML IV SOLN
10.0000 [IU] | Freq: Once | INTRAVENOUS | Status: DC
  Filled 2018-02-08: qty 0.1

## 2018-02-08 MED ORDER — BUDESONIDE 0.5 MG/2ML IN SUSP
0.5000 mg | Freq: Once | RESPIRATORY_TRACT | Status: AC
  Administered 2018-02-08: 0.5 mg via RESPIRATORY_TRACT
  Filled 2018-02-08: qty 2

## 2018-02-08 MED ORDER — FENTANYL 2500MCG IN NS 250ML (10MCG/ML) PREMIX INFUSION
0.0000 ug/h | INTRAVENOUS | Status: DC
  Administered 2018-02-08: 13:00:00 via INTRAVENOUS
  Administered 2018-02-08: 150 ug/h via INTRAVENOUS

## 2018-02-08 MED ORDER — BUDESONIDE 0.25 MG/2ML IN SUSP
0.2500 mg | Freq: Four times a day (QID) | RESPIRATORY_TRACT | Status: DC
  Administered 2018-02-08 – 2018-02-10 (×7): 0.25 mg via RESPIRATORY_TRACT
  Filled 2018-02-08 (×8): qty 2

## 2018-02-08 MED ORDER — ORAL CARE MOUTH RINSE
15.0000 mL | OROMUCOSAL | Status: DC
  Administered 2018-02-08 – 2018-02-09 (×10): 15 mL via OROMUCOSAL

## 2018-02-08 MED ORDER — INSULIN GLARGINE 100 UNIT/ML ~~LOC~~ SOLN
10.0000 [IU] | Freq: Every day | SUBCUTANEOUS | Status: DC
  Administered 2018-02-08 – 2018-02-10 (×3): 10 [IU] via SUBCUTANEOUS
  Filled 2018-02-08 (×3): qty 0.1

## 2018-02-08 MED ORDER — PROPOFOL 1000 MG/100ML IV EMUL
5.0000 ug/kg/min | Freq: Once | INTRAVENOUS | Status: AC
  Administered 2018-02-08: 5 ug/kg/min via INTRAVENOUS

## 2018-02-08 MED ORDER — SENNOSIDES-DOCUSATE SODIUM 8.6-50 MG PO TABS: 1.0000 | ORAL_TABLET | Freq: Every evening | ORAL | Status: DC | PRN

## 2018-02-08 MED ORDER — MIDAZOLAM HCL 5 MG/5ML IJ SOLN: 2.0000 mg | Freq: Once | INTRAMUSCULAR | Status: DC

## 2018-02-08 MED ORDER — ACETAMINOPHEN 325 MG PO TABS: 650.0000 mg | ORAL_TABLET | Freq: Four times a day (QID) | ORAL | Status: DC | PRN

## 2018-02-08 MED ORDER — FENTANYL CITRATE (PF) 100 MCG/2ML IJ SOLN: 50.0000 ug | INTRAMUSCULAR | Status: DC | PRN

## 2018-02-08 MED ORDER — ONDANSETRON HCL 4 MG/2ML IJ SOLN: 4.0000 mg | Freq: Four times a day (QID) | INTRAMUSCULAR | Status: DC | PRN

## 2018-02-08 MED ORDER — INSULIN ASPART 100 UNIT/ML ~~LOC~~ SOLN: 0.0000 [IU] | Freq: Every day | SUBCUTANEOUS | Status: DC

## 2018-02-08 MED ORDER — HYDRALAZINE HCL 20 MG/ML IJ SOLN
10.0000 mg | INTRAMUSCULAR | Status: DC | PRN
  Filled 2018-02-08: qty 1

## 2018-02-08 MED ORDER — MIDAZOLAM HCL 5 MG/5ML IJ SOLN
INTRAMUSCULAR | Status: AC
  Filled 2018-02-08: qty 5

## 2018-02-08 MED ORDER — LEVALBUTEROL HCL 0.63 MG/3ML IN NEBU: 0.6300 mg | INHALATION_SOLUTION | RESPIRATORY_TRACT | Status: DC | PRN

## 2018-02-08 MED ORDER — FENTANYL 2500MCG IN NS 250ML (10MCG/ML) PREMIX INFUSION
INTRAVENOUS | Status: AC
  Filled 2018-02-08: qty 250

## 2018-02-08 MED ORDER — ALBUTEROL SULFATE (2.5 MG/3ML) 0.083% IN NEBU
2.5000 mg | INHALATION_SOLUTION | RESPIRATORY_TRACT | Status: DC | PRN
  Administered 2018-02-08: 2.5 mg via RESPIRATORY_TRACT
  Filled 2018-02-08: qty 3

## 2018-02-08 MED ORDER — INSULIN ASPART 100 UNIT/ML ~~LOC~~ SOLN
0.0000 [IU] | SUBCUTANEOUS | Status: DC
  Administered 2018-02-08: 5 [IU] via SUBCUTANEOUS
  Administered 2018-02-09 (×4): 2 [IU] via SUBCUTANEOUS
  Filled 2018-02-08 (×5): qty 1

## 2018-02-08 MED ORDER — INSULIN ASPART 100 UNIT/ML ~~LOC~~ SOLN: 0.0000 [IU] | Freq: Three times a day (TID) | SUBCUTANEOUS | Status: DC

## 2018-02-08 MED ORDER — ETOMIDATE 2 MG/ML IV SOLN
INTRAVENOUS | Status: AC | PRN
  Administered 2018-02-08: 20 mg via INTRAVENOUS

## 2018-02-08 MED FILL — Medication: Qty: 1 | Status: AC

## 2018-02-08 NOTE — Progress Notes (Signed)
Inpatient Diabetes Program Recommendations  AACE/ADA: New Consensus Statement on Inpatient Glycemic Control (2015)  Target Ranges:  Prepandial:   less than 140 mg/dL      Peak postprandial:   less than 180 mg/dL (1-2 hours)      Critically ill patients:  140 - 180 mg/dL   Results for ILA, LANDOWSKI (MRN [id number]) as of 02/08/2018 14:05  Ref. Range 02/08/2018 09:50 02/08/2018 12:41  Glucose-Capillary Latest Ref Range: 70 - 99 mg/dL 307 (H) 217 (H)    Admit with Resp Failure  History: DM, COPD  Home DM Meds: Metformin 1000 mg BID  Current Orders: Lantus 10 units Daily      Novolog Moderate Correction Scale/ SSI (0-15 units) Q4 hours     Intubated and being admitted to the ICU.   Coded around 12pm today.  Now has orders for Lantus and Novolog.    --Will follow patient during hospitalization--  Wyn Quaker RN, MSN, CDE Diabetes Coordinator Inpatient Glycemic Control Team Team Pager: [phone] (8a-5p)

## 2018-02-08 NOTE — ED Provider Notes (Signed)
Patient has been improving to the point where she was requiring sedation with propofol.  Suddenly she lost pulses but continued to have an organized rhythm, appear to be in PEA.  We gave 0.5 mg of IV epinephrine with return of circulation.  She briefly had chest compressions during this time.   Earleen Newport, MD 02/08/18 [phone]

## 2018-02-08 NOTE — ED Notes (Signed)
Patient's husband states patient had not been feeling well lately, about 1 month or more.  Denies cough.  Husband states today, patient could not get out of bed this morning.  Husband states patient passed out once they got in the car.

## 2018-02-08 NOTE — Code Documentation (Signed)
12:01pm: BP 55/31, weak and thready pulses, MD Williams notified and present at bedside, resp paged. Propofol stopped, per MD. Pressure bag with normal saline started.  12:02: No pulse present, 0.5 epi given.  12:03: No femoral pulse present, Compressions started. Pressure 92/70 12:04: Compressions stopped. Femoral pulses present.  12:08: Pt pulling at tube and restless. Mittens placed. MD aware.  12:15: Dr. Alva Garnet present at bedside.

## 2018-02-08 NOTE — Progress Notes (Signed)
Verified with [facility] placement of right arm PICC line. Documented in IV flowsheet ECG confirmation in SVC.   Belinda Jenkins called back from [facility] to verify placement.

## 2018-02-08 NOTE — ED Provider Notes (Signed)
[place]  Department of Emergency Medicine   Code Blue CONSULT NOTE  Chief Complaint: Cardiac arrest/unresponsive   Level V Caveat: Unresponsive  History of present illness: Presented to the patient's bedside after she was taken out of her private vehicle unresponsive and not breathing   ROS: Unable to obtain, Level V caveat.  Husband states she has been dealing with a respiratory illness and he thinks she may have pneumonia.  She has been having shortness of breath the last month.  Scheduled Meds: Continuous Infusions: PRN Meds:.etomidate, succinylcholine Past Medical History:  Diagnosis Date  . COPD (chronic obstructive pulmonary disease) ([place])   . Diabetes mellitus without complication ([place])   . Family history of adverse reaction to anesthesia    sister - slow to wake after 1 procedure  . Hypertension   . Vertigo    Past Surgical History:  Procedure Laterality Date  . ABDOMINAL HYSTERECTOMY    . BACK SURGERY     L5  . BLEPHAROPLASTY Bilateral   . BREAST SURGERY     lumpectomy(x1), milk duct(x1)  . CATARACT EXTRACTION W/ INTRAOCULAR LENS  IMPLANT, BILATERAL    . COLONOSCOPY WITH PROPOFOL N/A 07/14/2016   Procedure: COLONOSCOPY WITH PROPOFOL;  Surgeon: Lucilla Lame, MD;  Location: [place];  Service: Endoscopy;  Laterality: N/A;  Diabetic - oral meds requests arrival around 8 AM  . HIP SURGERY Left   . POLYPECTOMY  07/14/2016   Procedure: POLYPECTOMY;  Surgeon: Lucilla Lame, MD;  Location: [place];  Service: Endoscopy;;   Social History   Socioeconomic History  . Marital status: Married    Spouse name: Not on file  . Number of children: Not on file  . Years of education: Not on file  . Highest education level: Not on file  Occupational History  . Not on file  Social Needs  . Financial resource strain: Not on file  . Food insecurity:    Worry: Not on file    Inability: Not on file  . Transportation needs:    Medical: Not on  file    Non-medical: Not on file  Tobacco Use  . Smoking status: Former Smoker    Last attempt to quit: 04/15/2003    Years since quitting: 14.8  . Smokeless tobacco: Never Used  Substance and Sexual Activity  . Alcohol use: Yes    Alcohol/week: 1.0 standard drinks    Types: 1 Shots of liquor per week  . Drug use: Not on file  . Sexual activity: Not on file  Lifestyle  . Physical activity:    Days per week: Not on file    Minutes per session: Not on file  . Stress: Not on file  Relationships  . Social connections:    Talks on phone: Not on file    Gets together: Not on file    Attends religious service: Not on file    Active member of club or organization: Not on file    Attends meetings of clubs or organizations: Not on file    Relationship status: Not on file  . Intimate partner violence:    Fear of current or ex partner: Not on file    Emotionally abused: Not on file    Physically abused: Not on file    Forced sexual activity: Not on file  Other Topics Concern  . Not on file  Social History Narrative  . Not on file   Allergies  Allergen Reactions  . Sulfamethoxazole-Trimethoprim  Other (See Comments)    Sulfa causes tingling  . Morphine     Other reaction(s): Other (See Comments) Morphine causes mind altering.  . Oxycodone-Acetaminophen     Other reaction(s): Other (See Comments) Percocet causes mind altering.  . Sertraline Other (See Comments)    Sertraline causes worsening depression.  . Adhesive [Tape] Rash    Some bandaids cause rash    Last set of Vital Signs (not current) Vitals:   02/08/18 0944 02/08/18 0945  BP:  119/81  Pulse: 100   Resp: 14 16  SpO2: 100%       Physical Exam Gen: unresponsive Cardiovascular: Adequate peripheral pulses, bradycardia Resp: apneic. Breath sounds diminished but equal with bagging Abd: nondistended  Neuro: GCS 3, unresponsive to pain  HEENT: No blood in posterior pharynx, gag reflex absent  Neck: No crepitus   Musculoskeletal: No deformity  Skin: warm, pallor is noted  Labs Reviewed  CBC WITH DIFFERENTIAL/PLATELET - Abnormal; Notable for the following components:      Result Value   MCHC 28.0 (*)    All other components within normal limits  BASIC METABOLIC PANEL - Abnormal; Notable for the following components:   Potassium 5.4 (*)    Chloride 90 (*)    CO2 36 (*)    Glucose, Bld 373 (*)    All other components within normal limits  BRAIN NATRIURETIC PEPTIDE - Abnormal; Notable for the following components:   B Natriuretic Peptide 118.0 (*)    All other components within normal limits  TROPONIN I - Abnormal; Notable for the following components:   Troponin I 0.03 (*)    All other components within normal limits  FIBRIN DERIVATIVES D-DIMER ([facility] ONLY) - Abnormal; Notable for the following components:   Fibrin derivatives D-dimer ([facility]) 515.03 (*)    All other components within normal limits  BLOOD GAS, ARTERIAL - Abnormal; Notable for the following components:   pH, Arterial 7.18 (*)    pCO2 arterial 86 (*)    pO2, Arterial 110 (*)    Bicarbonate 32.1 (*)    All other components within normal limits  GLUCOSE, CAPILLARY - Abnormal; Notable for the following components:   Glucose-Capillary 307 (*)    All other components within normal limits  SAMPLE TO BLOOD BANK     Procedures  INTUBATION Performed by: Laurence Aly Required items: required blood products, implants, devices, and special equipment available Patient identity confirmed: provided demographic data and hospital-assigned identification number Time out: Immediately prior to procedure a "time out" was called to verify the correct patient, procedure, equipment, support staff and site/side marked as required. Indications: Respiratory arrest Intubation method: Rapid sequence, glide scope Preoxygenation: 100% BVM Sedatives: 20 mg etomidate Paralytic: 100 mg succinylcholine Tube Size: 8.0 cuffed Post-procedure  assessment: chest rise and ETCO2 monitor Breath sounds: equal and absent over the epigastrium Tube secured by Respiratory Therapy Patient tolerated the procedure well with no immediate complications.  Imaging: Chest x-ray and KUB are unremarkable, ET tube and OG tube in good position  CRITICAL CARE Performed by: Laurence Aly Total critical care time: 30 Critical care time was exclusive of separately billable procedures and treating other patients. Critical care was necessary to treat or prevent imminent or life-threatening deterioration. Critical care was time spent personally by me on the following activities: development of treatment plan with patient and/or surrogate as well as nursing, discussions with consultants, evaluation of patient's response to treatment, examination of patient, obtaining history from patient or surrogate,  ordering and performing treatments and interventions, ordering and review of laboratory studies, ordering and review of radiographic studies, pulse oximetry and re-evaluation of patient's condition.   Medical Decision making  Respiratory arrest, COPD, pneumonia, PE, pneumothorax, arrhythmia, MI  Assessment and Plan  Acute hypercarbic respiratory failure  Unclear etiology for her respiratory arrest.  She appears stabilized after intubation.  Tube was passed to the cords without significant difficulty, I placed an OG tube as well.  This is likely acute hypercarbic respiratory failure from COPD.  She did require intubation and nearly arrested on arrival.  She currently appears to be improving, has required some sedation.  I do not think she will need to be intubated for very long, can likely be weaned to BiPAP soon.  I will discuss with the hospitalist for admission.   Earleen Newport, MD 02/08/18 1105

## 2018-02-08 NOTE — ED Notes (Signed)
Patient presents to the ED via POC pale with pulse and agonal respirations.  Patient taken to ED room 17 and nurses began giving assisted respirations with bag mask.

## 2018-02-08 NOTE — Consult Note (Signed)
PULMONARY / CRITICAL CARE MEDICINE   NAME:  Belinda Jenkins, MRN:  [id number], DOB:  22-Mar-1951, LOS: 0 ADMISSION DATE:  02/08/2018, CONSULTATION DATE:  10/28 REFERRING MD:  Juluis Mire, CHIEF COMPLAINT:  Acute resp failure  BRIEF HISTORY:    63 F former smoker with severe COPD @ baseline (FEV1 0.48 L in 2016), O2 dependent for 10 years brought to [facility] ED with altered mental status, hypercarbic on ABG, intubated for agonal respirations. In ED, suffered hypotension and brief pulselessness requiring 2 minutes chest compression.  Received 0.5 mg epinephrine. At baseline, struggles with ADLs but cognition fully intact.  Followed by [facility] pulmonary  HISTORY OF PRESENT ILLNESS   As above.  Level 5 caveat  SIGNIFICANT PAST MEDICAL HISTORY    Past Medical History:  Diagnosis Date  . COPD (chronic obstructive pulmonary disease) ([place])   . Diabetes mellitus without complication ([place])   . Family history of adverse reaction to anesthesia    sister - slow to wake after 1 procedure  . Hypertension   . Vertigo      SIGNIFICANT EVENTS:  10/28 admission as above    STUDIES:   10/28 CT chest: Mild-moderate emphysema, predominantly in upper lobes.  No pulmonary embolism.  Minimal area of atelectasis in RML 10/28 Echocardiogram:   CULTURES:  Respiratory 10/28 >>  Blood 10/28 >>   ANTIBIOTICS:  Ceftriaxone 10/28 >>   LINES/TUBES:  PICC 10/28 (ordered) >>   CONSULTANTS:   SUBJECTIVE:    CONSTITUTIONAL: BP (!) 52/27 (BP Location: Left Arm)   Pulse (!) 101   Temp (!) 96.4 F (35.8 C) (Bladder)   Resp (!) 22   Ht 5\' 1"  (1.549 m)   Wt 64.1 kg   SpO2 97%   BMI 26.70 kg/m   No intake/output data recorded.     Vent Mode: PRVC FiO2 (%):  [35 %-40 %] 40 % Set Rate:  [10 bmp-20 bmp] 10 bmp Vt Set:  [450 mL-500 mL] 500 mL PEEP:  [5 cmH20] 5 cmH20 Plateau Pressure:  [25 cmH20] 25 cmH20  PHYSICAL EXAM: General: Intubated, sedated Neuro: CNs intact, moves all extremities, DTRs  symmetric HEENT: NCAT, sclerae white Cardiovascular: Distant at bedtime, regular, no M noted Lungs: Very distant BS, very prolonged expiratory phase, no wheezes noted Abdomen: Small, firm reducible mass in RUQ, abdomen mildly distended and tympanitic, diminished BS Extremities: Cool, decreased PD pulses, no edema Skin: No lesions noted  PAST MEDICAL HISTORY :   She  has a past medical history of COPD (chronic obstructive pulmonary disease) ([place]), Diabetes mellitus without complication ([place]), Family history of adverse reaction to anesthesia, Hypertension, and Vertigo.  PAST SURGICAL HISTORY:  She  has a past surgical history that includes Abdominal hysterectomy; Hip surgery (Left); Back surgery; Cataract extraction w/ intraocular lens  implant, bilateral; Blepharoplasty (Bilateral); Breast surgery; Colonoscopy with propofol (N/A, 07/14/2016); and polypectomy (07/14/2016).  Allergies  Allergen Reactions  . Sulfamethoxazole-Trimethoprim Other (See Comments)    Sulfa causes tingling  . Morphine     Other reaction(s): Other (See Comments) Morphine causes mind altering.  . Oxycodone-Acetaminophen     Other reaction(s): Other (See Comments) Percocet causes mind altering.  . Sertraline Other (See Comments)    Sertraline causes worsening depression.  . Adhesive [Tape] Rash    Some bandaids cause rash    No current facility-administered medications on file prior to encounter.    Current Outpatient Medications on File Prior to Encounter  Medication Sig  . alendronate (FOSAMAX) 70 MG tablet  Take 70 mg by mouth once a week.   . ALPRAZolam (XANAX) 0.5 MG tablet Take 0.5 mg by mouth 2 (two) times daily.   . Cholecalciferol (VITAMIN D3) 2000 units capsule Take 2,000 Units by mouth daily.  . Fluticasone-Salmeterol (ADVAIR DISKUS) 250-50 MCG/DOSE AEPB Inhale 1 puff into the lungs every 12 (twelve) hours.   . Ipratropium-Albuterol (COMBIVENT RESPIMAT) 20-100 MCG/ACT AERS respimat Inhale 1 puff into  the lungs 4 (four) times daily as needed for wheezing.   Marland Kitchen ipratropium-albuterol (DUONEB) 0.5-2.5 (3) MG/3ML SOLN Inhale 3 mLs into the lungs 4 (four) times daily as needed (for wheezing).   . metFORMIN (GLUCOPHAGE) 1000 MG tablet Take 1,000 mg by mouth 2 (two) times daily.   . valsartan (DIOVAN) 160 MG tablet Take 160 mg by mouth daily.  . vitamin B-12 (CYANOCOBALAMIN) 1000 MCG tablet Take 1,000 mcg by mouth daily.    FAMILY HISTORY:   Her family history is not on file.  SOCIAL HISTORY:  She  reports that she quit smoking about 14 years ago. She has never used smokeless tobacco. She reports that she drinks about 1.0 standard drinks of alcohol per week.  REVIEW OF SYSTEMS:    Level 5 caveat  LABS  Glucose Recent Labs  Lab 02/08/18 0950 02/08/18 1241  GLUCAP 307* 217*    BMET Recent Labs  Lab 02/08/18 0942 02/08/18 1427  NA 139 141  K 5.4* 4.8  CL 90* 99  CO2 36* 35*  BUN 20 19  CREATININE 0.69 0.65  GLUCOSE 373* 218*    Liver Enzymes No results for input(s): AST, ALT, ALKPHOS, BILITOT, ALBUMIN in the last 168 hours.  Electrolytes Recent Labs  Lab 02/08/18 0942 02/08/18 1427  CALCIUM 9.8 8.7*  MG  --  1.6*  PHOS  --  4.5    CBC Recent Labs  Lab 02/08/18 0942  WBC 5.9  HGB 12.0  HCT 42.8  PLT 328    ABG Recent Labs  Lab 02/08/18 0950  PHART 7.18*  PCO2ART 86*  PO2ART 110*    Coag's No results for input(s): APTT, INR in the last 168 hours.  Sepsis Markers Recent Labs  Lab 02/08/18 1349  PROCALCITON <0.10    Cardiac Enzymes Recent Labs  Lab 02/08/18 0942 02/08/18 1427  TROPONINI 0.03* 0.03*     ASSESSMENT AND PLAN   1) very severe oxygen dependent COPD 2) acute/chronic hypercarbic/hypoxemic respiratory failure 3) acute COPD exacerbation with severe airflow obstruction 4) hypotension due to severe gas trapping and increased intrathoracic pressure reducing venous return to RV 5) type 2 diabetes with hyperglycemia 6) doubt  pneumonia 7) limited venous access  Vent settings established  Permissive hypercarbia until airflow obstruction improves Vent bundle implemented Daily SBT as indicated Systemic steroids Nebulized steroids and bronchodilators  Budesonide + levalbuterol + ipratropium Empiric antibiotics for COPD exacerbation Volume resuscitation PICC ordered - might require vasopressors Lantus + SSI ordered DVT px: Enoxaparin Monitor CBC intermittently Transfuse per usual guidelines  Monitor temp, WBC count Micro and abx as above  PAD protocol - RASS goal -2, -3  Propofol, fentanyl infusions  I spoke with the patient's husband, daughter and son in detail.  We discussed the patient's critical illness.  I indicated that she is very difficult to support on mechanical ventilation due to severe obstructive lung disease.  We discussed advanced directives.  Her husband indicates that she would not wish to live a prolonged existence on mechanical ventilator support.  We agreed to provide support for  up to 7 to 10 days with daily reassessment of appropriateness of aggressive support.  We agreed that she is not a candidate for tracheostomy tube placement and prolonged mechanical ventilation.  We agreed that once she is "as good as we can get her", we will extubate with the understanding that reintubation and further mechanical ventilation would not be in her best interest.  If she does survive this hospitalization, I would like to see her in follow-up as an outpatient to help improve her COPD management.  CCM time: 60 mins The above time includes time spent in consultation with patient and/or family members and reviewing care plan on multidisciplinary rounds  Merton Border, MD PCCM service Mobile [phone] Pager [phone]

## 2018-02-08 NOTE — H&P (Signed)
[facility] [place] NAME: Belinda Jenkins    MR#:  [id number]  DATE OF BIRTH:  1950/08/15  DATE OF ADMISSION:  02/08/2018  PRIMARY CARE PHYSICIAN: Adin Hector, MD   REQUESTING/REFERRING PHYSICIAN:  Dr Jimmye Norman  CHIEF COMPLAINT:   Respiratory failure HISTORY OF PRESENT ILLNESS:  Belinda Jenkins  is a 67 y.o. female with a known history of chronic hypoxic respiratory failure on 2 to 3 L of oxygen due to COPD and diabetes who presents to the emergency room with her family due to respiratory distress. According to the ER MD and ER nurse patient's family members found her this morning very confused and nonverbal which is very unlike the patient.  They were concerned so they brought her to the ER.  On the way to the emergency room she was unresponsive and when she arrived to the ER she had agonal breathing.  She was intubated in the emergency room.  Head CT was negative for acute stroke.  CT chest was negative for PE however does show consolidation right middle lobe. PAST MEDICAL HISTORY:   Past Medical History:  Diagnosis Date  . COPD (chronic obstructive pulmonary disease) ([place])   . Diabetes mellitus without complication ([place])   . Family history of adverse reaction to anesthesia    sister - slow to wake after 1 procedure  . Hypertension   . Vertigo     PAST SURGICAL HISTORY:   Past Surgical History:  Procedure Laterality Date  . ABDOMINAL HYSTERECTOMY    . BACK SURGERY     L5  . BLEPHAROPLASTY Bilateral   . BREAST SURGERY     lumpectomy(x1), milk duct(x1)  . CATARACT EXTRACTION W/ INTRAOCULAR LENS  IMPLANT, BILATERAL    . COLONOSCOPY WITH PROPOFOL N/A 07/14/2016   Procedure: COLONOSCOPY WITH PROPOFOL;  Surgeon: Lucilla Lame, MD;  Location: [facility];  Service: Endoscopy;  Laterality: N/A;  Diabetic - oral meds requests arrival around 8 AM  . HIP SURGERY Left   . POLYPECTOMY  07/14/2016   Procedure: POLYPECTOMY;  Surgeon:  Lucilla Lame, MD;  Location: [facility];  Service: Endoscopy;;    SOCIAL HISTORY:   Social History   Tobacco Use  . Smoking status: Former Smoker    Last attempt to quit: 04/15/2003    Years since quitting: 14.8  . Smokeless tobacco: Never Used  Substance Use Topics  . Alcohol use: Yes    Alcohol/week: 1.0 standard drinks    Types: 1 Shots of liquor per week    FAMILY HISTORY:  Unknown as patient is intubated  DRUG ALLERGIES:   Allergies  Allergen Reactions  . Sulfamethoxazole-Trimethoprim Other (See Comments)    Sulfa causes tingling  . Morphine     Other reaction(s): Other (See Comments) Morphine causes mind altering.  . Oxycodone-Acetaminophen     Other reaction(s): Other (See Comments) Percocet causes mind altering.  . Sertraline Other (See Comments)    Sertraline causes worsening depression.  . Adhesive [Tape] Rash    Some bandaids cause rash    REVIEW OF SYSTEMS:   Review of Systems  Unable to perform ROS: Intubated    MEDICATIONS AT HOME:   Prior to Admission medications   Medication Sig Start Date End Date Taking? Authorizing Provider  alendronate (FOSAMAX) 70 MG tablet Take 70 mg by mouth once a week.    Yes [provider]  ALPRAZolam Duanne Moron) 0.5 MG tablet Take 0.5 mg by mouth 2 (  two) times daily.    Yes [provider]  Cholecalciferol (VITAMIN D3) 2000 units capsule Take 2,000 Units by mouth daily.   Yes [provider]  Fluticasone-Salmeterol (ADVAIR DISKUS) 250-50 MCG/DOSE AEPB Inhale 1 puff into the lungs every 12 (twelve) hours.    Yes [provider]  Ipratropium-Albuterol (COMBIVENT RESPIMAT) 20-100 MCG/ACT AERS respimat Inhale 1 puff into the lungs 4 (four) times daily as needed for wheezing.    Yes [provider]  ipratropium-albuterol (DUONEB) 0.5-2.5 (3) MG/3ML SOLN Inhale 3 mLs into the lungs 4 (four) times daily as needed (for wheezing).    Yes [provider]  metFORMIN  (GLUCOPHAGE) 1000 MG tablet Take 1,000 mg by mouth 2 (two) times daily.    Yes [provider]  valsartan (DIOVAN) 160 MG tablet Take 160 mg by mouth daily.   Yes [provider]  vitamin B-12 (CYANOCOBALAMIN) 1000 MCG tablet Take 1,000 mcg by mouth daily.   Yes [provider]      VITAL SIGNS:  Blood pressure 99/70, pulse 96, temperature (!) 95 F (35 C), resp. rate 18, weight 65.3 kg, SpO2 96 %.  PHYSICAL EXAMINATION:   Physical Exam  Constitutional: No distress.  HENT:  Head: Normocephalic.  Eyes: No scleral icterus.  Neck: Neck supple. No JVD present. No tracheal deviation present. No thyromegaly present.  Cardiovascular: Normal rate, regular rhythm and normal heart sounds. Exam reveals no gallop and no friction rub.  No murmur heard. Pulmonary/Chest: Effort normal. No respiratory distress. She has wheezes. She has no rales. She exhibits no tenderness.  Abdominal: Soft. Bowel sounds are normal. She exhibits no distension and no mass. There is no tenderness. There is no rebound and no guarding.  Musculoskeletal: She exhibits no edema, tenderness or deformity.  Neurological: She is alert.  She is alert and awake on the ventilator  Skin: Skin is warm. No rash noted. No erythema.  Psychiatric:  Her eyes are open on the ventilator  Following some commands      LABORATORY PANEL:   CBC Recent Labs  Lab 02/08/18 0942  WBC 5.9  HGB 12.0  HCT 42.8  PLT 328   ------------------------------------------------------------------------------------------------------------------  Chemistries  Recent Labs  Lab 02/08/18 0942  NA 139  K 5.4*  CL 90*  CO2 36*  GLUCOSE 373*  BUN 20  CREATININE 0.69  CALCIUM 9.8   ------------------------------------------------------------------------------------------------------------------  Cardiac Enzymes Recent Labs  Lab 02/08/18 0942  TROPONINI 0.03*    ------------------------------------------------------------------------------------------------------------------  RADIOLOGY:  Dg Abdomen 1 View  Result Date: 02/08/2018 CLINICAL DATA:  Orogastric tube placement EXAM: ABDOMEN - 1 VIEW COMPARISON:  10/02/2008 abdominal CT FINDINGS: Orogastric tube with tip at the distal stomach. Artifact from EKG leads and defibrillator pads. Partial coverage of the abdomen showing moderate stool volume. IMPRESSION: Orogastric tube tip at the distal stomach. Electronically Signed   By: Monte Fantasia M.D.   On: 02/08/2018 10:18   Ct Head Wo Contrast  Result Date: 02/08/2018 CLINICAL DATA:  Altered level of consciousness EXAM: CT HEAD WITHOUT CONTRAST TECHNIQUE: Contiguous axial images were obtained from the base of the skull through the vertex without intravenous contrast. COMPARISON:  None. FINDINGS: Brain: There is mild cerebral atrophy and chronic small vessel disease throughout the deep white matter. Ventriculomegaly appears to be upper portion 2 volume loss raising the possibility of hydrocephalus. No acute infarction or hemorrhage. Vascular: No hyperdense vessel or unexpected calcification. Skull: No acute calvarial abnormality. Sinuses/Orbits: Visualized paranasal sinuses and mastoids clear. Orbital  soft tissues unremarkable. Other: None IMPRESSION: Mild cerebral atrophy and chronic small vessel disease. Ventriculomegaly appears out of proportion raising the possibility of hydrocephalus. Electronically Signed   By: Rolm Baptise M.D.   On: 02/08/2018 10:57   Ct Angio Chest Pe W And/or Wo Contrast  Result Date: 02/08/2018 CLINICAL DATA:  Ill x1 month, syncope, positive D-dimer, COPD EXAM: CT ANGIOGRAPHY CHEST WITH CONTRAST TECHNIQUE: Multidetector CT imaging of the chest was performed using the standard protocol during bolus administration of intravenous contrast. Multiplanar CT image reconstructions and MIPs were obtained to evaluate the vascular anatomy.  CONTRAST:  71mL OMNIPAQUE IOHEXOL 350 MG/ML SOLN COMPARISON:  None. FINDINGS: Cardiovascular: Heart size normal. No pericardial effusion. Borderline dilated central pulmonary arteries. Satisfactory opacification of pulmonary arteries noted, and there is no evidence of pulmonary emboli. Scattered coronary calcifications. Adequate contrast opacification of the thoracic aorta with no evidence of dissection, aneurysm, or stenosis. There is classic 3-vessel brachiocephalic arch anatomy without proximal stenosis. Scattered calcified plaque in the arch and descending thoracic segment as well as in the visualized proximal abdominal aorta. Mediastinum/Nodes: No hilar or mediastinal adenopathy. Endotracheal tube tip 1.6 cm above the carina. Lungs/Pleura: No pleural effusion. No pneumothorax. Emphysematous changes most evident in the lung apices. Subsegmental atelectasis/consolidation medially in the lingula and right middle lobe. Lungs otherwise clear. Upper Abdomen: Nasogastric tube terminates in the decompressed stomach. No acute findings. Musculoskeletal: No chest wall abnormality. No acute or significant osseous findings. Review of the MIP images confirms the above findings. IMPRESSION: 1. Negative for acute PE or thoracic aortic dissection. 2. Coronary and Aortic Atherosclerosis (ICD10-170.0). 3. Emphysema (ICD10-J43.9) with subsegmental atelectasis/consolidation in the lingula and medial right middle lobe. Electronically Signed   By: Lucrezia Europe M.D.   On: 02/08/2018 11:03   Dg Chest Port 1 View  Result Date: 02/08/2018 CLINICAL DATA:  Post intubation EXAM: PORTABLE CHEST 1 VIEW COMPARISON:  11/01/2004 FINDINGS: Endotracheal tube is 5 cm above the carina. NG tube enters the stomach. There is hyperinflation of the lungs compatible with COPD. Heart is normal size. No confluent opacities or effusions. IMPRESSION: Endotracheal tube 5 cm above the carina. COPD.  No active disease. Electronically Signed   By: Rolm Baptise  M.D.   On: 02/08/2018 10:17    EKG:  Sinus tachycardia Biatrial enlargement RBBB and LPFB  IMPRESSION AND PLAN:   67 year old female with chronic hypoxic respiratory failure due to COPD and diabetes who presents to the emergency room with agonal breathing.  1.  Acute on chronic hypoxic/hypercarbic respiratory failure in the setting of COPD exacerbation and pneumonia with consolidation right middle lobe Case discussed with intensivist Patient currently intubated and will continue current vent settings ABG ordered Solu-Medrol 40 IV every 8 hours with duo nebs  Cefepime for pneumonia Further management as per intensivist Continue sedation with propofol  2.  Hyperkalemia: We will treat with insulin and dextrose  3.  Diabetes: Monitor blood sugars closely May need insulin drip per ICU protocol Diabetes nurse consultation 4.  Hypertension: Hydralazine and labetalol as needed      All the records are reviewed and case discussed with ED provider. Management plans discussed with nursing  CODE STATUS: FULL  CRITICAL CARE TOTAL TIME TAKING CARE OF THIS PATIENT: 60 minutes.    Waldon Sheerin M.D on 02/08/2018 at 11:36 AM  Between 7am to 6pm - Pager - [phone]  After 6pm go to [email or web address] - password [email or web address] [facility]  Office  [phone]  CC: Primary  care physician; Adin Hector, MD   \

## 2018-02-08 NOTE — ED Notes (Signed)
Patient's son states he was at house helping patient get in the car to come to the hospital.  States patient was non-verbal but awake and seemed confused.  He stated, "it was like when she has her blood sugar episodes."  Son also states that patient asked for a refill of her inhaler but he had refilled it very recently.  States, "maybe she was overdoing it with that."

## 2018-02-08 NOTE — ED Notes (Signed)
First Nurse Note: Patient arrived via POV, called to outside by buzzer, found patient sitting in WC, unresponsive, carotid pulse present, pale, agonal respirations, skin cool and dry.  Charge Nurse called for bed, patient taken directly to Rm 17.

## 2018-02-08 NOTE — Progress Notes (Signed)
ETT pulled 2-3 cm as per MD order. ETT now secured at 21 cm at the lips.

## 2018-02-09 ENCOUNTER — Inpatient Hospital Stay: Payer: Medicare Other

## 2018-02-09 DIAGNOSIS — J9621 Acute and chronic respiratory failure with hypoxia: Principal | ICD-10-CM

## 2018-02-09 LAB — CBC
HCT: 31.3 % — ABNORMAL LOW (ref 36.0–46.0)
Hemoglobin: 9.4 g/dL — ABNORMAL LOW (ref 12.0–15.0)
MCH: 27.6 pg (ref 26.0–34.0)
MCHC: 30 g/dL (ref 30.0–36.0)
MCV: 91.8 fL (ref 80.0–100.0)
Platelets: 236 10*3/uL (ref 150–400)
RBC: 3.41 MIL/uL — AB (ref 3.87–5.11)
RDW: 13.3 % (ref 11.5–15.5)
WBC: 5.7 10*3/uL (ref 4.0–10.5)
nRBC: 0 % (ref 0.0–0.2)

## 2018-02-09 LAB — GLUCOSE, CAPILLARY
GLUCOSE-CAPILLARY: 143 mg/dL — AB (ref 70–99)
GLUCOSE-CAPILLARY: 101 mg/dL — AB (ref 70–99)
GLUCOSE-CAPILLARY: 137 mg/dL — AB (ref 70–99)
Glucose-Capillary: 121 mg/dL — ABNORMAL HIGH (ref 70–99)
Glucose-Capillary: 138 mg/dL — ABNORMAL HIGH (ref 70–99)
Glucose-Capillary: 139 mg/dL — ABNORMAL HIGH (ref 70–99)

## 2018-02-09 LAB — BASIC METABOLIC PANEL
ANION GAP: 10 (ref 5–15)
BUN: 19 mg/dL (ref 8–23)
CALCIUM: 7.4 mg/dL — AB (ref 8.9–10.3)
CO2: 30 mmol/L (ref 22–32)
Chloride: 103 mmol/L (ref 98–111)
Creatinine, Ser: 0.54 mg/dL (ref 0.44–1.00)
GFR calc Af Amer: >60 mL/min (ref >60)
GFR calc non Af Amer: >60 mL/min (ref >60)
GLUCOSE: 161 mg/dL — AB (ref 70–99)
POTASSIUM: 3.9 mmol/L (ref 3.5–5.1)
Sodium: 143 mmol/L (ref 135–145)

## 2018-02-09 LAB — PROCALCITONIN: Procalcitonin: <0.1 ng/mL

## 2018-02-09 LAB — MAGNESIUM: MAGNESIUM: 1.5 mg/dL — AB (ref 1.7–2.4)

## 2018-02-09 LAB — ECHOCARDIOGRAM COMPLETE
Height: 61 in
Weight: 2261.04 oz

## 2018-02-09 LAB — ALBUMIN: ALBUMIN: 3.3 g/dL — AB (ref 3.5–5.0)

## 2018-02-09 MED ORDER — PREDNISONE 20 MG PO TABS
40.0000 mg | ORAL_TABLET | Freq: Every day | ORAL | Status: AC
  Administered 2018-02-09 – 2018-02-12 (×4): 40 mg via ORAL
  Filled 2018-02-09 (×2): qty 2
  Filled 2018-02-09 (×2): qty 4

## 2018-02-09 MED ORDER — ALPRAZOLAM 0.5 MG PO TABS
0.5000 mg | ORAL_TABLET | Freq: Two times a day (BID) | ORAL | Status: DC
  Administered 2018-02-09 – 2018-02-12 (×7): 0.5 mg via ORAL
  Filled 2018-02-09 (×7): qty 1

## 2018-02-09 MED ORDER — ENOXAPARIN SODIUM 40 MG/0.4ML ~~LOC~~ SOLN
40.0000 mg | SUBCUTANEOUS | Status: DC
  Administered 2018-02-09 – 2018-02-11 (×3): 40 mg via SUBCUTANEOUS
  Filled 2018-02-09 (×3): qty 0.4

## 2018-02-09 MED ORDER — MAGNESIUM SULFATE 2 GM/50ML IV SOLN
2.0000 g | Freq: Once | INTRAVENOUS | Status: AC
  Administered 2018-02-09: 2 g via INTRAVENOUS
  Filled 2018-02-09: qty 50

## 2018-02-09 MED ORDER — SODIUM CHLORIDE 0.9 % IV BOLUS
500.0000 mL | Freq: Once | INTRAVENOUS | Status: AC
  Administered 2018-02-09: 500 mL via INTRAVENOUS

## 2018-02-09 NOTE — Progress Notes (Signed)
Pt extubated at 2091, with no complications. Now on 3 liters [place] o2 sats 95%. Pt tolerating ice chips well. Will continue to monitor.

## 2018-02-09 NOTE — Consult Note (Signed)
Pharmacy Electrolytes CONSULT NOTE  Belinda Jenkins is a 67 YO female who was admitted on 02/08/2018 for respiratory failure. Patient's significant history includes chronic hypoxic respiratory failure on 2 to 3 L of oxygen due to COPD and diabetes. Pharmacy has been consulted for electrolytes replacement.    Assessment/Plan: Electrolyte goals: K+ ~4 and Mg ~2.  Sodium and potassium were WNL this morning. Magnesium was 1.5 and patient received magnesium IV 2 g x 1 dose.   Recheck with AM labs tomorrow.  Pharmacy will continue to follow and adjust per consult.   Ocie Doyne, PharmD Candidate  02/09/2018,1:26 PM

## 2018-02-09 NOTE — Progress Notes (Signed)
[facility] [place] NAME: Belinda Jenkins    MR#:  [id number]  DATE OF BIRTH:  12-Dec-1950  SUBJECTIVE:  CHIEF COMPLAINT:  No chief complaint on file. hungry, wants to eat, all family at bedside REVIEW OF SYSTEMS:  Review of Systems  Constitutional: Negative for chills, fever and weight loss.  HENT: Negative for nosebleeds and sore throat.   Eyes: Negative for blurred vision.  Respiratory: Positive for shortness of breath. Negative for cough and wheezing.   Cardiovascular: Negative for chest pain, orthopnea, leg swelling and PND.  Gastrointestinal: Negative for abdominal pain, constipation, diarrhea, heartburn, nausea and vomiting.  Genitourinary: Negative for dysuria and urgency.  Musculoskeletal: Negative for back pain.  Skin: Negative for rash.  Neurological: Negative for dizziness, speech change, focal weakness and headaches.  Endo/Heme/Allergies: Does not bruise/bleed easily.  Psychiatric/Behavioral: Negative for depression.    DRUG ALLERGIES:   Allergies  Allergen Reactions  . Sulfamethoxazole-Trimethoprim Other (See Comments)    Sulfa causes tingling  . Morphine     Other reaction(s): Other (See Comments) Morphine causes mind altering.  . Oxycodone-Acetaminophen     Other reaction(s): Other (See Comments) Percocet causes mind altering.  . Sertraline Other (See Comments)    Sertraline causes worsening depression.  . Adhesive [Tape] Rash    Some bandaids cause rash   VITALS:  Blood pressure (!) 140/91, pulse (!) 104, temperature 98.8 F (37.1 C), resp. rate 20, height 5\' 1"  (1.549 m), weight 67.7 kg, SpO2 99 %. PHYSICAL EXAMINATION:  Physical Exam  Constitutional: She is oriented to person, place, and time.  HENT:  Head: Normocephalic and atraumatic.  Eyes: Pupils are equal, round, and reactive to light. Conjunctivae and EOM are normal.  Neck: Normal range of motion. Neck supple. No tracheal deviation present. No  thyromegaly present.  Cardiovascular: Normal rate, regular rhythm and normal heart sounds.  Pulmonary/Chest: Effort normal and breath sounds normal. No respiratory distress. She has no wheezes. She exhibits no tenderness.  Abdominal: Soft. Bowel sounds are normal. She exhibits no distension. There is no tenderness.  Musculoskeletal: Normal range of motion.  Neurological: She is alert and oriented to person, place, and time. No cranial nerve deficit.  Skin: Skin is warm and dry. No rash noted.   LABORATORY PANEL:  Female CBC Recent Labs  Lab 02/09/18 0353  WBC 5.7  HGB 9.4*  HCT 31.3*  PLT 236   ------------------------------------------------------------------------------------------------------------------ Chemistries  Recent Labs  Lab 02/09/18 0353 02/09/18 0446  NA 143  --   K 3.9  --   CL 103  --   CO2 30  --   GLUCOSE 161*  --   BUN 19  --   CREATININE 0.54  --   CALCIUM 7.4*  --   MG  --  1.5*   RADIOLOGY:  Dg Chest Port 1 View  Result Date: 02/09/2018 CLINICAL DATA:  68 year old female with respiratory failure. EXAM: PORTABLE CHEST 1 VIEW COMPARISON:  Chest CT dated 02/08/2018 FINDINGS: Right-sided PICC with tip at the cavoatrial junction, endotracheal tube with tip above the carina, and enteric tube with tip beyond the inferior margin of the image noted. The lungs are clear. There is no pleural effusion or pneumothorax. The cardiac silhouette is within normal limits. No acute osseous pathology. IMPRESSION: No acute cardiopulmonary process. Electronically Signed   By: Anner Crete M.D.   On: 02/09/2018 05:49   ASSESSMENT AND PLAN:  67 year old female with chronic hypoxic respiratory failure due  to COPD and diabetes who presents to the emergency room with agonal breathing.  1.  Acute on chronic hypoxic/hypercarbic respiratory failure in the setting of COPD exacerbation and pneumonia with consolidation right middle lobe - extubated this am - continue Solu-Medrol  40 IV every 8 hours with duo nebs  - Cefepime for pneumonia  2.  Hyperkalemia: - Resolved with insulin and dextrose  3.  Diabetes: controlled per ICU protocol  4.  Hypertension: Hydralazine and labetalol as needed     All the records are reviewed and case discussed with Care Management/Social Worker. Management plans discussed with the patient, family and they are in agreement.  CODE STATUS: Full Code  TOTAL TIME TAKING CARE OF THIS PATIENT: 35 minutes.   More than 50% of the time was spent in counseling/coordination of care: YES  POSSIBLE D/C IN 1-2 DAYS, DEPENDING ON CLINICAL CONDITION.   Max Sane M.D on 02/09/2018 at 4:03 PM  Between 7am to 6pm - Pager - [phone]  After 6pm go to [email or web address] - password [email or web address]  [facility] [place] [facility]  Office  [phone]  CC: Primary care physician; Adin Hector, MD  Note: This dictation was prepared with Dragon dictation along with smaller phrase technology. Any transcriptional errors that result from this process are unintentional.

## 2018-02-09 NOTE — Progress Notes (Signed)
Name: Belinda Jenkins MRN: [id number] DOB: 21-Aug-1950     CONSULTATION DATE: 02/08/2018  Subjective & objectives: Remains on vent, awake light sedation and sedated with propofol and fentanyl  PAST MEDICAL HISTORY :   has a past medical history of COPD (chronic obstructive pulmonary disease) ([place]), Diabetes mellitus without complication ([place]), Family history of adverse reaction to anesthesia, Hypertension, and Vertigo.  has a past surgical history that includes Abdominal hysterectomy; Hip surgery (Left); Back surgery; Cataract extraction w/ intraocular lens  implant, bilateral; Blepharoplasty (Bilateral); Breast surgery; Colonoscopy with propofol (N/A, 07/14/2016); and polypectomy (07/14/2016). Prior to Admission medications   Medication Sig Start Date End Date Taking? Authorizing Provider  alendronate (FOSAMAX) 70 MG tablet Take 70 mg by mouth once a week.    Yes [provider]  ALPRAZolam Duanne Moron) 0.5 MG tablet Take 0.5 mg by mouth 2 (two) times daily.    Yes [provider]  Cholecalciferol (VITAMIN D3) 2000 units capsule Take 2,000 Units by mouth daily.   Yes [provider]  Fluticasone-Salmeterol (ADVAIR DISKUS) 250-50 MCG/DOSE AEPB Inhale 1 puff into the lungs every 12 (twelve) hours.    Yes [provider]  Ipratropium-Albuterol (COMBIVENT RESPIMAT) 20-100 MCG/ACT AERS respimat Inhale 1 puff into the lungs 4 (four) times daily as needed for wheezing.    Yes [provider]  ipratropium-albuterol (DUONEB) 0.5-2.5 (3) MG/3ML SOLN Inhale 3 mLs into the lungs 4 (four) times daily as needed (for wheezing).    Yes [provider]  metFORMIN (GLUCOPHAGE) 1000 MG tablet Take 1,000 mg by mouth 2 (two) times daily.    Yes [provider]  valsartan (DIOVAN) 160 MG tablet Take 160 mg by mouth daily.   Yes [provider]  vitamin B-12 (CYANOCOBALAMIN) 1000 MCG tablet Take 1,000 mcg by mouth daily.   Yes [provider]   Allergies  Allergen Reactions  . Sulfamethoxazole-Trimethoprim Other (See Comments)    Sulfa causes tingling  . Morphine     Other reaction(s): Other (See Comments) Morphine causes mind altering.  . Oxycodone-Acetaminophen     Other reaction(s): Other (See Comments) Percocet causes mind altering.  . Sertraline Other (See Comments)    Sertraline causes worsening depression.  . Adhesive [Tape] Rash    Some bandaids cause rash    FAMILY HISTORY:  family history is not on file. SOCIAL HISTORY:  reports that she quit smoking about 14 years ago. She has never used smokeless tobacco. She reports that she drinks about 1.0 standard drinks of alcohol per week.  REVIEW OF SYSTEMS:   Unable to obtain due to critical illness   VITAL SIGNS: Temp:  [94.9 F (34.9 C)-101.3 F (38.5 C)] 98.8 F (37.1 C) (10/29 0900) Pulse Rate:  [66-140] 111 (10/29 0900) Resp:  [9-24] 16 (10/29 0900) BP: (30-241)/(17-199) 158/83 (10/29 0900) SpO2:  [79 %-100 %] 99 % (10/29 0751) FiO2 (%):  [35 %-40 %] 40 % (10/29 0751) Weight:  [64.1 kg-67.7 kg] 67.7 kg (10/29 0400)  Physical Examination:  Light sedation awake GNR following commands On vent, no distress, bilateral equal air entry with no adventitious sounds S1 & S2 are audible with no murmur Benign abdominal exam with normal peristalsis  No edema   ASSESSMENT / PLAN:  Acute on chronic respiratory failure using home O2 2 L/min.  Tolerating CPAP pressure support. -Weaning parameter   Acute exacerbation of COPD -Optimize bronchodilators + inhaled steroids + tapering systemic steroids  Atelectasis and pneumonia.  Bibasilar airspace disease  right greater than left.  Respiratory culture GPC and GNR.  Procalcitonin less than 0.1 no leukocytosis.  4% MRSA PCR negative -Continue with empiric Rocephin and consider de-escalation of antimicrobial was final result of respiratory culture. -Incentive spirometer after extubation and  respiratory care  Diabetes mellitus  -Glycemic control  Hypertension with medication (improved) -Monitor hemodynamics  Anxiety -Optimize Xanax  Anemia with hemoglobin drop more than 2.5 g of the last 24-hour more likely dilutional with no signs of obvious bleeding -Monitor H&H  Full code  DVT & GI prophylaxis.  Continue with supportive care  Patient and the family were updated at the bedside and they agreed to the plan of care  Critical care time 40 minutes

## 2018-02-09 NOTE — Progress Notes (Signed)
Pt was suctioned prior to extubation for a small amount of clear secretions. Per Dr. Artist [place] order, she was extubated to a 3 L nasal cannula. She is voicing and has no stridor.

## 2018-02-10 ENCOUNTER — Inpatient Hospital Stay: Payer: Medicare Other

## 2018-02-10 DIAGNOSIS — J9601 Acute respiratory failure with hypoxia: Secondary | ICD-10-CM

## 2018-02-10 LAB — CBC WITH DIFFERENTIAL/PLATELET
Abs Immature Granulocytes: 0.04 10*3/uL (ref 0.00–0.07)
BASOS PCT: 0 %
Basophils Absolute: 0 10*3/uL (ref 0.0–0.1)
Eosinophils Absolute: 0 10*3/uL (ref 0.0–0.5)
Eosinophils Relative: 0 %
HCT: 31.4 % — ABNORMAL LOW (ref 36.0–46.0)
Hemoglobin: 9.2 g/dL — ABNORMAL LOW (ref 12.0–15.0)
IMMATURE GRANULOCYTES: 1 %
Lymphocytes Relative: 10 %
Lymphs Abs: 0.7 10*3/uL (ref 0.7–4.0)
MCH: 27.1 pg (ref 26.0–34.0)
MCHC: 29.3 g/dL — ABNORMAL LOW (ref 30.0–36.0)
MCV: 92.4 fL (ref 80.0–100.0)
Monocytes Absolute: 0.7 10*3/uL (ref 0.1–1.0)
Monocytes Relative: 9 %
NEUTROS ABS: 5.8 10*3/uL (ref 1.7–7.7)
NEUTROS PCT: 80 %
PLATELETS: 244 10*3/uL (ref 150–400)
RBC: 3.4 MIL/uL — AB (ref 3.87–5.11)
RDW: 13.3 % (ref 11.5–15.5)
WBC: 7.2 10*3/uL (ref 4.0–10.5)
nRBC: 0 % (ref 0.0–0.2)

## 2018-02-10 LAB — PROCALCITONIN

## 2018-02-10 LAB — GLUCOSE, CAPILLARY
GLUCOSE-CAPILLARY: 79 mg/dL (ref 70–99)
Glucose-Capillary: 97 mg/dL (ref 70–99)
Glucose-Capillary: 249 mg/dL — ABNORMAL HIGH (ref 70–99)
Glucose-Capillary: 228 mg/dL — ABNORMAL HIGH (ref 70–99)
Glucose-Capillary: 146 mg/dL — ABNORMAL HIGH (ref 70–99)

## 2018-02-10 LAB — BASIC METABOLIC PANEL
Anion gap: 6 (ref 5–15)
BUN: 16 mg/dL (ref 8–23)
CALCIUM: 7.9 mg/dL — AB (ref 8.9–10.3)
CO2: 35 mmol/L — ABNORMAL HIGH (ref 22–32)
Chloride: 99 mmol/L (ref 98–111)
Creatinine, Ser: 0.53 mg/dL (ref 0.44–1.00)
GFR calc Af Amer: >60 mL/min (ref >60)
Glucose, Bld: 91 mg/dL (ref 70–99)
Potassium: 3.7 mmol/L (ref 3.5–5.1)
SODIUM: 140 mmol/L (ref 135–145)

## 2018-02-10 MED ORDER — ALBUTEROL SULFATE (2.5 MG/3ML) 0.083% IN NEBU: 2.5000 mg | INHALATION_SOLUTION | RESPIRATORY_TRACT | Status: DC | PRN

## 2018-02-10 MED ORDER — ACETAMINOPHEN 325 MG PO TABS: 650.0000 mg | ORAL_TABLET | Freq: Four times a day (QID) | ORAL | Status: DC | PRN

## 2018-02-10 MED ORDER — POTASSIUM CHLORIDE CRYS ER 20 MEQ PO TBCR
40.0000 meq | EXTENDED_RELEASE_TABLET | Freq: Once | ORAL | Status: AC
  Administered 2018-02-10: 40 meq via ORAL
  Filled 2018-02-10: qty 2

## 2018-02-10 MED ORDER — SENNOSIDES-DOCUSATE SODIUM 8.6-50 MG PO TABS
2.0000 | ORAL_TABLET | Freq: Two times a day (BID) | ORAL | Status: DC
  Administered 2018-02-10 – 2018-02-12 (×5): 2 via ORAL
  Filled 2018-02-10 (×5): qty 2

## 2018-02-10 MED ORDER — HYDRALAZINE HCL 20 MG/ML IJ SOLN
10.0000 mg | INTRAMUSCULAR | Status: DC | PRN
  Administered 2018-02-10: 19:00:00 20 mg via INTRAVENOUS
  Filled 2018-02-10: qty 1

## 2018-02-10 MED ORDER — INSULIN ASPART 100 UNIT/ML ~~LOC~~ SOLN
0.0000 [IU] | Freq: Three times a day (TID) | SUBCUTANEOUS | Status: DC
  Administered 2018-02-10 (×2): 5 [IU] via SUBCUTANEOUS
  Administered 2018-02-11: 08:00:00 3 [IU] via SUBCUTANEOUS
  Administered 2018-02-11: 18:00:00 5 [IU] via SUBCUTANEOUS
  Administered 2018-02-11: 13:00:00 3 [IU] via SUBCUTANEOUS
  Filled 2018-02-10 (×5): qty 1

## 2018-02-10 MED ORDER — DOXYCYCLINE HYCLATE 100 MG PO TABS
100.0000 mg | ORAL_TABLET | Freq: Two times a day (BID) | ORAL | Status: DC
  Administered 2018-02-10: 100 mg via ORAL
  Filled 2018-02-10: qty 1

## 2018-02-10 MED ORDER — INSULIN ASPART 100 UNIT/ML ~~LOC~~ SOLN: 0.0000 [IU] | Freq: Every day | SUBCUTANEOUS | Status: DC

## 2018-02-10 MED ORDER — SODIUM CHLORIDE 0.9% FLUSH: 10.0000 mL | INTRAVENOUS | Status: DC | PRN

## 2018-02-10 MED ORDER — ACETAMINOPHEN 650 MG RE SUPP: 650.0000 mg | Freq: Four times a day (QID) | RECTAL | Status: DC | PRN

## 2018-02-10 MED ORDER — BUDESONIDE 0.5 MG/2ML IN SUSP
0.5000 mg | Freq: Four times a day (QID) | RESPIRATORY_TRACT | Status: DC
  Administered 2018-02-10 – 2018-02-11 (×4): 0.5 mg via RESPIRATORY_TRACT
  Filled 2018-02-10 (×5): qty 2

## 2018-02-10 MED ORDER — SODIUM CHLORIDE 0.9 % IV SOLN
1.0000 g | INTRAVENOUS | Status: DC
  Administered 2018-02-10: 1 g via INTRAVENOUS
  Filled 2018-02-10: qty 10
  Filled 2018-02-10: qty 1

## 2018-02-10 MED ORDER — LABETALOL HCL 5 MG/ML IV SOLN
10.0000 mg | INTRAVENOUS | Status: DC | PRN
  Administered 2018-02-10 – 2018-02-11 (×2): 10 mg via INTRAVENOUS
  Filled 2018-02-10 (×3): qty 4

## 2018-02-10 MED ORDER — IPRATROPIUM-ALBUTEROL 0.5-2.5 (3) MG/3ML IN SOLN
3.0000 mL | Freq: Four times a day (QID) | RESPIRATORY_TRACT | Status: DC
  Administered 2018-02-10 – 2018-02-12 (×7): 3 mL via RESPIRATORY_TRACT
  Filled 2018-02-10 (×7): qty 3

## 2018-02-10 MED ORDER — INFLUENZA VAC SPLIT HIGH-DOSE 0.5 ML IM SUSY
0.5000 mL | PREFILLED_SYRINGE | INTRAMUSCULAR | Status: AC
  Administered 2018-02-12: 09:00:00 0.5 mL via INTRAMUSCULAR
  Filled 2018-02-10 (×2): qty 0.5

## 2018-02-10 MED ORDER — SODIUM CHLORIDE 0.9% FLUSH
10.0000 mL | Freq: Two times a day (BID) | INTRAVENOUS | Status: DC
  Administered 2018-02-10 – 2018-02-12 (×4): 10 mL

## 2018-02-10 MED ORDER — IRBESARTAN 150 MG PO TABS
150.0000 mg | ORAL_TABLET | Freq: Every day | ORAL | Status: DC
  Administered 2018-02-10 – 2018-02-12 (×2): 150 mg via ORAL
  Filled 2018-02-10 (×3): qty 1

## 2018-02-10 MED ORDER — SODIUM CHLORIDE 0.9 % IV SOLN
1.0000 g | INTRAVENOUS | Status: DC
  Filled 2018-02-10: qty 10

## 2018-02-10 NOTE — Care Management Note (Signed)
Case Management Note  Patient Details  Name: Belinda Jenkins MRN: [id number] Date of Birth: Aug 10, 1950  Subjective/Objective:      RNCM consult for medication assistance.  Patient will be needing additional nebulizer medications when discharged home and they can be pricy.   Patient is sitting up in bed surrounded by family, 2 sisters and a niece from [place].  Patient is from home, lives with her husband.  She is on chronic O2 3 L, [employer] is her oxygen supplier.  The patient reports she has been on home O2 for 10 years.  The patient has a nebulizer at home she is currently taking Duo Nebs at home.  Patent states she has Medicare Part D prescription drug plan with [employer].  Patient uses [facility] in [place] as her pharmacy.   Patient reports that she has trouble affording her prescription drugs even with the Medicare Part D with [employer].  RNCM will find resources for the patient and family that may be able to assist with the cost of medications.              Action/Plan: RNCM to follow up.    Expected Discharge Date:                  Expected Discharge Plan:     In-House Referral:     Discharge planning Services  CM Consult  Post Acute Care Choice:    Choice offered to:     DME Arranged:    DME Agency:     HH Arranged:    HH Agency:     Status of Service:  In process, will continue to follow  If discussed at Long Length of Stay Meetings, dates discussed:    Additional Comments:  Shelbie Hutching, RN 02/10/2018, 2:01 PM

## 2018-02-10 NOTE — Progress Notes (Signed)
Name: Belinda Jenkins MRN: [id number] DOB: 04-05-1951     CONSULTATION DATE: 02/08/2018  Objective & objectives: Extubated on 02/09/2018 has been tolerating nasal cannula.  PAST MEDICAL HISTORY :   has a past medical history of COPD (chronic obstructive pulmonary disease) ([place]), Diabetes mellitus without complication ([place]), Family history of adverse reaction to anesthesia, Hypertension, and Vertigo.  has a past surgical history that includes Abdominal hysterectomy; Hip surgery (Left); Back surgery; Cataract extraction w/ intraocular lens  implant, bilateral; Blepharoplasty (Bilateral); Breast surgery; Colonoscopy with propofol (N/A, 07/14/2016); and polypectomy (07/14/2016). Prior to Admission medications   Medication Sig Start Date End Date Taking? Authorizing Provider  alendronate (FOSAMAX) 70 MG tablet Take 70 mg by mouth once a week.    Yes [provider]  ALPRAZolam Duanne Moron) 0.5 MG tablet Take 0.5 mg by mouth 2 (two) times daily.    Yes [provider]  Cholecalciferol (VITAMIN D3) 2000 units capsule Take 2,000 Units by mouth daily.   Yes [provider]  Fluticasone-Salmeterol (ADVAIR DISKUS) 250-50 MCG/DOSE AEPB Inhale 1 puff into the lungs every 12 (twelve) hours.    Yes [provider]  Ipratropium-Albuterol (COMBIVENT RESPIMAT) 20-100 MCG/ACT AERS respimat Inhale 1 puff into the lungs 4 (four) times daily as needed for wheezing.    Yes [provider]  ipratropium-albuterol (DUONEB) 0.5-2.5 (3) MG/3ML SOLN Inhale 3 mLs into the lungs 4 (four) times daily as needed (for wheezing).    Yes [provider]  metFORMIN (GLUCOPHAGE) 1000 MG tablet Take 1,000 mg by mouth 2 (two) times daily.    Yes [provider]  valsartan (DIOVAN) 160 MG tablet Take 160 mg by mouth daily.   Yes [provider]  vitamin B-12 (CYANOCOBALAMIN) 1000 MCG tablet Take 1,000 mcg by mouth daily.   Yes [provider]   Allergies    Allergen Reactions  . Sulfamethoxazole-Trimethoprim Other (See Comments)    Sulfa causes tingling  . Morphine     Other reaction(s): Other (See Comments) Morphine causes mind altering.  . Oxycodone-Acetaminophen     Other reaction(s): Other (See Comments) Percocet causes mind altering.  . Sertraline Other (See Comments)    Sertraline causes worsening depression.  . Adhesive [Tape] Rash    Some bandaids cause rash    FAMILY HISTORY:  family history is not on file. SOCIAL HISTORY:  reports that she quit smoking about 14 years ago. She has never used smokeless tobacco. She reports that she drinks about 1.0 standard drinks of alcohol per week.  REVIEW OF SYSTEMS:   Unable to obtain due to critical illness   VITAL SIGNS: Temp:  [98.1 F (36.7 C)-99.1 F (37.3 C)] 98.4 F (36.9 C) (10/30 0800) Pulse Rate:  [62-115] 115 (10/30 1200) Resp:  [15-29] 23 (10/30 1200) BP: (120-178)/(60-92) 178/88 (10/30 1200) SpO2:  [90 %-100 %] 90 % (10/30 1200) Weight:  [68.4 kg] 68.4 kg (10/30 0500)  Physical Examination:  Awake and oriented with no focal neurological deficits On the cannula, able to talk in full sentences, no distress, bilateral equal air entry with no adventitious sounds S1 & S2 are audible with no murmur Benign abdominal exam with normal peristalsis  No edema   ASSESSMENT / PLAN:  Acute on chronic respiratory failure using home O2 2 L/min.   Extubated on 02/09/2018 and tolerating nasal cannula  Acute exacerbation of COPD -Optimize bronchodilators + inhaled steroids + tapering systemic steroids  Atelectasis and pneumonia. Improved bibasilar airspace disease right greater than left.  Respiratory culture GNR.  Procalcitonin less than 0.1 no leukocytosis. MRSA PCR negative -Continue with empiric Rocephin and consider de-escalation of antimicrobial with final result of respiratory culture. -Incentive spirometer after extubation and respiratory care  Diabetes  mellitus  -Glycemic control  Hypertension with medication (improved).  Echocardiogram 02/08/2018 LVEF of 45% with diffuse hypokinesis -Monitor hemodynamics  Anxiety -Optimize Xanax  Anemia -Keep hemoglobin more than 7 g/dL  Full code  DVT & GI prophylaxis.  Continue with supportive care  Patient and the family were updated at the bedside and they agreed to the plan of care  Critical care time 35  minutes

## 2018-02-10 NOTE — Consult Note (Addendum)
Pharmacy Electrolytes CONSULT NOTE  Belinda Jenkins is a 67 YO female who was admitted on 02/08/2018 for respiratory failure. Patient's significant history includes chronic hypoxic respiratory failure on 2 to 3 L of oxygen due to COPD and diabetes. Pharmacy has been consulted for electrolytes replacement.    Assessment/Plan: Electrolyte goals: K+ ~4 and Mg ~2.   Ordered KCl 40 mEq PO x 1.  Recheck with AM labs tomorrow.  Pharmacy will continue to follow and adjust per consult.    [name], PharmD Pharmacy Resident  02/10/2018 2:22 PM

## 2018-02-10 NOTE — Progress Notes (Signed)
[facility] at [place] NAME: Belinda Jenkins    MR#:  [id number]  DATE OF BIRTH:  1950-07-20  SUBJECTIVE:  CHIEF COMPLAINT:  No chief complaint on file. no complaints. Feeling better, tachycardic and BP up REVIEW OF SYSTEMS:  Review of Systems  Constitutional: Negative for chills, fever and weight loss.  HENT: Negative for nosebleeds and sore throat.   Eyes: Negative for blurred vision.  Respiratory: Positive for shortness of breath. Negative for cough and wheezing.   Cardiovascular: Negative for chest pain, orthopnea, leg swelling and PND.  Gastrointestinal: Negative for abdominal pain, constipation, diarrhea, heartburn, nausea and vomiting.  Genitourinary: Negative for dysuria and urgency.  Musculoskeletal: Negative for back pain.  Skin: Negative for rash.  Neurological: Negative for dizziness, speech change, focal weakness and headaches.  Endo/Heme/Allergies: Does not bruise/bleed easily.  Psychiatric/Behavioral: Negative for depression.   DRUG ALLERGIES:   Allergies  Allergen Reactions  . Sulfamethoxazole-Trimethoprim Other (See Comments)    Sulfa causes tingling  . Morphine     Other reaction(s): Other (See Comments) Morphine causes mind altering.  . Oxycodone-Acetaminophen     Other reaction(s): Other (See Comments) Percocet causes mind altering.  . Sertraline Other (See Comments)    Sertraline causes worsening depression.  . Adhesive [Tape] Rash    Some bandaids cause rash   VITALS:  Blood pressure (!) 178/88, pulse (!) 115, temperature 98.4 F (36.9 C), temperature source Bladder, resp. rate (!) 23, height 5\' 1"  (1.549 m), weight 68.4 kg, SpO2 90 %. PHYSICAL EXAMINATION:  Physical Exam  Constitutional: She is oriented to person, place, and time.  HENT:  Head: Normocephalic and atraumatic.  Eyes: Pupils are equal, round, and reactive to light. Conjunctivae and EOM are normal.  Neck: Normal range of motion. Neck  supple. No tracheal deviation present. No thyromegaly present.  Cardiovascular: Normal rate, regular rhythm and normal heart sounds.  Pulmonary/Chest: Effort normal and breath sounds normal. No respiratory distress. She has no wheezes. She exhibits no tenderness.  Abdominal: Soft. Bowel sounds are normal. She exhibits no distension. There is no tenderness.  Musculoskeletal: Normal range of motion.  Neurological: She is alert and oriented to person, place, and time. No cranial nerve deficit.  Skin: Skin is warm and dry. No rash noted.   LABORATORY PANEL:  Female CBC Recent Labs  Lab 02/10/18 0507  WBC 7.2  HGB 9.2*  HCT 31.4*  PLT 244   ------------------------------------------------------------------------------------------------------------------ Chemistries  Recent Labs  Lab 02/09/18 0446 02/10/18 0507  NA  --  140  K  --  3.7  CL  --  99  CO2  --  35*  GLUCOSE  --  91  BUN  --  16  CREATININE  --  0.53  CALCIUM  --  7.9*  MG 1.5*  --    RADIOLOGY:  Dg Chest Port 1 View  Result Date: 02/10/2018 CLINICAL DATA:  Follow-up atelectasis. EXAM: PORTABLE CHEST 1 VIEW COMPARISON:  Chest radiograph performed 02/09/2018 FINDINGS: The lungs are well-aerated and clear. There is no evidence of focal opacification, pleural effusion or pneumothorax. Previously noted lingular atelectasis is not well characterized on radiograph. The cardiomediastinal silhouette is borderline normal in size. No acute osseous abnormalities are seen. A right PICC is noted ending about the mid SVC. IMPRESSION: No acute cardiopulmonary process seen. Electronically Signed   By: Garald Balding M.D.   On: 02/10/2018 09:28   ASSESSMENT AND PLAN:  67 year old female with chronic hypoxic respiratory  failure due to COPD and diabetes who presents to the emergency room with agonal breathing.  1.  Acute on chronic hypoxic/hypercarbic respiratory failure in the setting of COPD exacerbation and pneumonia with  consolidation right middle lobe - extubated Y'day and remains on [place] Since then - continue Solu-Medrol 40 with duo nebs  - Cefepime for pneumonia  2.  Hyperkalemia: - Resolved with insulin and dextrose  3.  Diabetes: controlled per ICU protocol  4.  Hypertension: Hydralazine and labetalol as needed     All the records are reviewed and case discussed with Care Management/Social Worker. Management plans discussed with the patient, nursing and they are in agreement.  CODE STATUS: Full Code  TOTAL TIME TAKING CARE OF THIS PATIENT: 15 minutes.   More than 50% of the time was spent in counseling/coordination of care: YES  POSSIBLE D/C IN 1-2 DAYS, DEPENDING ON CLINICAL CONDITION.   Max Sane M.D on 02/10/2018 at 1:38 PM  Between 7am to 6pm - Pager - [phone]  After 6pm go to [email or web address] - password [email or web address] [facility]  [facility] [place] [facility]  Office  [phone]  CC: Primary care physician; Adin Hector, MD  Note: This dictation was prepared with Dragon dictation along with smaller phrase technology. Any transcriptional errors that result from this process are unintentional.

## 2018-02-10 NOTE — Progress Notes (Addendum)
Pharmacy Antibiotic Note  Belinda Jenkins is a 67 y.o. female admitted on 02/08/2018 with COPD exacerbation. Patient was admitted to ICU on 10/28 after cardiac arrest.  Plan: Continue Ceftriaxone 1 g IV q24h.  Height: 5\' 1"  (154.9 cm) Weight: 150 lb 12.7 oz (68.4 kg) IBW/kg (Calculated) : 47.8  Temp (24hrs), Avg:98.6 F (37 C), Min:98.1 F (36.7 C), Max:99.1 F (37.3 C)  Recent Labs  Lab 02/08/18 0942 02/08/18 1427 02/09/18 0353 02/10/18 0507  WBC 5.9  --  5.7 7.2  CREATININE 0.69 0.65 0.54 0.53    Estimated Creatinine Clearance: 59.5 mL/min (by C-G formula based on SCr of 0.53 mg/dL).    Allergies  Allergen Reactions  . Sulfamethoxazole-Trimethoprim Other (See Comments)    Sulfa causes tingling  . Morphine     Other reaction(s): Other (See Comments) Morphine causes mind altering.  . Oxycodone-Acetaminophen     Other reaction(s): Other (See Comments) Percocet causes mind altering.  . Sertraline Other (See Comments)    Sertraline causes worsening depression.  . Adhesive [Tape] Rash    Some bandaids cause rash    Antimicrobials this admission: 10/28 Ceftriaxone >>   Dose adjustments this admission: N/A  Microbiology results: 10/28 BCx: NG x 2 days 10/28 Resp Cx Abundant WBCs, GPCs, Few GNRs  10/28 MRSA PCR: (-)  Thank you for allowing pharmacy to be a part of this patient's care.  [name], PharmD Pharmacy Resident  02/10/2018 12:13 PM

## 2018-02-11 LAB — BASIC METABOLIC PANEL
Anion gap: 7 (ref 5–15)
BUN: 18 mg/dL (ref 8–23)
CO2: 37 mmol/L — ABNORMAL HIGH (ref 22–32)
CREATININE: 0.5 mg/dL (ref 0.44–1.00)
Calcium: 8.5 mg/dL — ABNORMAL LOW (ref 8.9–10.3)
Chloride: 96 mmol/L — ABNORMAL LOW (ref 98–111)
GFR calc Af Amer: >60 mL/min (ref >60)
Glucose, Bld: 144 mg/dL — ABNORMAL HIGH (ref 70–99)
POTASSIUM: 3.8 mmol/L (ref 3.5–5.1)
SODIUM: 140 mmol/L (ref 135–145)

## 2018-02-11 LAB — CULTURE, RESPIRATORY W GRAM STAIN

## 2018-02-11 LAB — GLUCOSE, CAPILLARY
GLUCOSE-CAPILLARY: 234 mg/dL — AB (ref 70–99)
Glucose-Capillary: 188 mg/dL — ABNORMAL HIGH (ref 70–99)
Glucose-Capillary: 196 mg/dL — ABNORMAL HIGH (ref 70–99)
Glucose-Capillary: 141 mg/dL — ABNORMAL HIGH (ref 70–99)

## 2018-02-11 LAB — MAGNESIUM: MAGNESIUM: 1.8 mg/dL (ref 1.7–2.4)

## 2018-02-11 LAB — CULTURE, RESPIRATORY

## 2018-02-11 MED ORDER — BUDESONIDE 0.5 MG/2ML IN SUSP
0.5000 mg | Freq: Two times a day (BID) | RESPIRATORY_TRACT | Status: DC
  Administered 2018-02-12: 0.5 mg via RESPIRATORY_TRACT
  Filled 2018-02-11: qty 2

## 2018-02-11 MED ORDER — LEVOFLOXACIN 750 MG PO TABS: 750.0000 mg | ORAL_TABLET | Freq: Every day | ORAL | 0 refills | Status: DC

## 2018-02-11 MED ORDER — LEVOFLOXACIN 750 MG PO TABS
750.0000 mg | ORAL_TABLET | Freq: Every day | ORAL | Status: DC
  Administered 2018-02-11 – 2018-02-12 (×2): 750 mg via ORAL
  Filled 2018-02-11 (×2): qty 1

## 2018-02-11 MED ORDER — PREDNISONE 10 MG (21) PO TBPK: ORAL_TABLET | ORAL | 0 refills | Status: DC

## 2018-02-11 NOTE — Clinical Social Work Note (Signed)
CSW consulted for SNF placement. Per Dr. Manuella Ghazi family has decided to take patient home. She will discharge home today with home health. CSW signed off. Please re consult if further needs arise.   [place], [place]

## 2018-02-11 NOTE — Discharge Instructions (Signed)

## 2018-02-11 NOTE — Care Management Important Message (Signed)
Important Message  Patient Details  Name: Belinda Jenkins MRN: [id number] Date of Birth: [birth year]   Medicare Important Message Given:  Yes    [name] 02/11/2018, 10:34 AM

## 2018-02-11 NOTE — Care Management (Signed)
Discharge cancelled for today. Working on obtaining a trilogy per [place]. Shelbie Ammons RN MSN CCM Care Management [phone]

## 2018-02-11 NOTE — Progress Notes (Signed)
[facility]  Address: [address], [place], [place] [address]  If patient discharges after hours, please call [phone].   Florene Glen 02/11/2018, 12:38 PM

## 2018-02-11 NOTE — Progress Notes (Signed)
Appears fatigued, weak.  No overt respiratory distress Reports profound weakness and dyspnea with minimal exertion  Vitals:   02/11/18 0211 02/11/18 0406 02/11/18 1124 02/11/18 1306  BP:  136/81 (!) 180/94 (!) 159/89  Pulse:  91 97 (!) 38  Resp:  17  20  Temp:  98.3 F (36.8 C)  98.3 F (36.8 C)  TempSrc:  Oral  Oral  SpO2: 96% 97%  97%  Weight:  69.3 kg    Height:      3 LPM  No apparent distress at rest HEENT WNL No JVD, no LAN Hyperresonant to percussion Breath sounds markedly diminished throughout No wheezes or other adventitious sounds Heart sounds distant, no murmur, regular Abdomen soft, NABS Extremities without clubbing/cyanosis/edema No focal neurologic deficits.  Diffusely weak  BMP Latest Ref Rng & Units 02/11/2018 02/10/2018 02/09/2018  Glucose 70 - 99 mg/dL 144(H) 91 161(H)  BUN 8 - 23 mg/dL 18 16 19   Creatinine 0.44 - 1.00 mg/dL 0.50 0.53 0.54  Sodium 135 - 145 mmol/L 140 140 143  Potassium 3.5 - 5.1 mmol/L 3.8 3.7 3.9  Chloride 98 - 111 mmol/L 96(L) 99 103  CO2 22 - 32 mmol/L 37(H) 35(H) 30  Calcium 8.9 - 10.3 mg/dL 8.5(L) 7.9(L) 7.4(L)   CBC Latest Ref Rng & Units 02/10/2018 02/09/2018 02/08/2018  WBC 4.0 - 10.5 K/uL 7.2 5.7 5.9  Hemoglobin 12.0 - 15.0 g/dL 9.2(L) 9.4(L) 12.0  Hematocrit 36.0 - 46.0 % 31.4(L) 31.3(L) 42.8  Platelets 150 - 400 K/uL 244 236 328   CXR: 10/30 no acute findings  IMP:  Very severe COPD Chronic hypoxemic respiratory failure Resolving acute/chronic hypoxemic/hypercarbic respiratory failure Resolving COPD exacerbation Post extubation weakness/deconditioning Evidence of pulmonary hypertension on echocardiogram, not quantified   DISCUSSION: Given the rapid improvement in airflow obstruction from the time of admission to the following day, I suspect that she has a significant asthmatic component and, if so, she is likely to benefit from inhaled corticosteroids.  When generated laboratory flow is too low, dry powder inhaler  medications are not adequately delivered to distal airways and become ineffective.  Therefore, I believe that she would be benefited from nebulized steroids (budesonide)   PLAN/REC: Continue supplemental oxygen.  Maintain SPO2 >90%  Continue nebulized budesonide.  I have changed dose schedule to 0.5 mg twice daily  This should be continued after discharge  Continue nebulized albuterol/ipratropium scheduled 4 times per day and every 3 hours as needed  This is a change from her current prescription of as needed only  At discharge, would not resume Advair (since we are changing to nebulized steroids)  I will arrange for follow-up with me in the office in 2 to 3 weeks  From my perspective, she may be discharged when she feels that she has sufficient strength to go home.  After discharge, she will need outpatient physical therapy in some form for another  PCCM will sign off. Please call if we can be of further assistance  Merton Border, MD PCCM service Mobile [phone] Pager [phone] 02/11/2018 2:10 PM

## 2018-02-11 NOTE — Progress Notes (Signed)
[facility] [place] NAME: Belinda Jenkins    MR#:  [id number]  DATE OF BIRTH:  1950-07-17  SUBJECTIVE:  CHIEF COMPLAINT:  No chief complaint on file. at baseline O2, SOB improving, weak REVIEW OF SYSTEMS:  Review of Systems  Constitutional: Negative for chills, fever and weight loss.  HENT: Negative for nosebleeds and sore throat.   Eyes: Negative for blurred vision.  Respiratory: Positive for shortness of breath. Negative for cough and wheezing.   Cardiovascular: Negative for chest pain, orthopnea, leg swelling and PND.  Gastrointestinal: Negative for abdominal pain, constipation, diarrhea, heartburn, nausea and vomiting.  Genitourinary: Negative for dysuria and urgency.  Musculoskeletal: Negative for back pain.  Skin: Negative for rash.  Neurological: Negative for dizziness, speech change, focal weakness and headaches.  Endo/Heme/Allergies: Does not bruise/bleed easily.  Psychiatric/Behavioral: Negative for depression.    DRUG ALLERGIES:   Allergies  Allergen Reactions  . Sulfamethoxazole-Trimethoprim Other (See Comments)    Sulfa causes tingling  . Morphine     Other reaction(s): Other (See Comments) Morphine causes mind altering.  . Oxycodone-Acetaminophen     Other reaction(s): Other (See Comments) Percocet causes mind altering.  . Sertraline Other (See Comments)    Sertraline causes worsening depression.  . Adhesive [Tape] Rash    Some bandaids cause rash   VITALS:  Blood pressure (!) 159/89, pulse (!) 38, temperature 98.3 F (36.8 C), temperature source Oral, resp. rate 20, height 5' 1.5" (1.562 m), weight 69.3 kg, SpO2 97 %. PHYSICAL EXAMINATION:  Physical Exam  Constitutional: She is oriented to person, place, and time.  HENT:  Head: Normocephalic and atraumatic.  Eyes: Pupils are equal, round, and reactive to light. Conjunctivae and EOM are normal.  Neck: Normal range of motion. Neck supple. No tracheal  deviation present. No thyromegaly present.  Cardiovascular: Normal rate, regular rhythm and normal heart sounds.  Pulmonary/Chest: Effort normal and breath sounds normal. No respiratory distress. She has no wheezes. She exhibits no tenderness.  Abdominal: Soft. Bowel sounds are normal. She exhibits no distension. There is no tenderness.  Musculoskeletal: Normal range of motion.  Neurological: She is alert and oriented to person, place, and time. No cranial nerve deficit.  Skin: Skin is warm and dry. No rash noted.   LABORATORY PANEL:  Female CBC Recent Labs  Lab 02/10/18 0507  WBC 7.2  HGB 9.2*  HCT 31.4*  PLT 244   ------------------------------------------------------------------------------------------------------------------ Chemistries  Recent Labs  Lab 02/11/18 0543  NA 140  K 3.8  CL 96*  CO2 37*  GLUCOSE 144*  BUN 18  CREATININE 0.50  CALCIUM 8.5*  MG 1.8   RADIOLOGY:  No results found. ASSESSMENT AND PLAN:  67 year old female with chronic hypoxic respiratory failure due to COPD and diabetes who presents to the emergency room with agonal breathing.  1.  Acute on chronic hypoxic/hypercarbic respiratory failure in the setting of COPD exacerbation and pneumonia with consolidation right middle lobe - continue her 3 liters O2 which she uses at home chronically - continue budesonide per PCCM - Continue nebulized albuterol/ipratropium scheduled 4 times per day and every 3 hours as needed. This is a change from her current prescription of as needed only - PCCM recommends not to resume Advair (since we are changing to nebulized steroids) - outpt Pulmo f/up  2.  Hyperkalemia: - Resolved with insulin and dextrose  3.  Diabetes: controlled per ICU protocol  4.  Hypertension: Hydralazine and labetalol as needed  Patient very clearly expresses her wish that she would like to go HOME and not STR/SNF. I've requested HH,PT, RN, Trilogy & COPD protocol.  I had long  talk with nursing, family and PCCM - I support patient's decision to be at home with support. Family and patient in agreement    All the records are reviewed and case discussed with Care Management/Social Worker. Management plans discussed with the patient, family and they are in agreement.  CODE STATUS: Full Code  TOTAL TIME TAKING CARE OF THIS PATIENT: 35 minutes.   More than 50% of the time was spent in counseling/coordination of care: YES  POSSIBLE D/C IN 1 DAYS, DEPENDING ON CLINICAL CONDITION.   Max Sane M.D on 02/11/2018 at 4:33 PM  Between 7am to 6pm - Pager - [phone]  After 6pm go to [email or web address] - password [email or web address] [facility]  [facility] [place] [facility]  Office  [phone]  CC: Primary care physician; Adin Hector, MD  Note: This dictation was prepared with Dragon dictation along with smaller phrase technology. Any transcriptional errors that result from this process are unintentional.

## 2018-02-11 NOTE — Evaluation (Signed)
Physical Therapy Evaluation Patient Details Name: Belinda Jenkins MRN: [id number] DOB: 11-26-1950 Today's Date: 02/11/2018   History of Present Illness  Pt is a 67 y.o. female presenting to hospital 02/08/18 with respiratory distress and unresponsive upon arrival to ED; pt with brief pulsenessness with about 2 minutes chest compressions in ED; pt intubated 10/28 and extubated 10/29.  Pt admitted with acute on chronic respiratory failure, acute exacerbation COPD, and PNA.  PMH includes COPD (2-3 L home O2), DM, htn, vertigo, L5 back surgery, and L hip surgery.  Clinical Impression  Prior to hospital admission, pt was modified independent with functional mobility (occasional use of SPC; 2-3 L home O2 chronic).  Pt lives with her husband in 1 level home with 3 steps to enter (no railing).  Currently pt is SBA semi-supine to/from sit; CGA to min assist with transfers; and CGA walking a few feet bed to/from [facility] with youth sized RW.  L lean noted in standing initially with RW use requiring vc's and tactile cues to correct.  Increased effort from pt required to perform above activities.  Pt reporting being fatigued and appearing visibly fatigued after session's activities.  Pt reporting mild to moderate dizziness during session's activities (BP 177/85 sitting edge of bed end of session--nurse notified).  SOB noted with activities (O2 sats 89% or greater on 3 L O2 via nasal cannula with session's activities and 94% end of session resting in bed).  Overall pt demonstrating generalized weakness and decreased activity tolerance from baseline.  Pt would benefit from skilled PT to address noted impairments and functional limitations (see below for any additional details).  Upon hospital discharge, recommend pt discharge to STR (CM and SW notified).    Follow Up Recommendations SNF    Equipment Recommendations  Rolling walker with 5" wheels;Wheelchair (measurements PT);Wheelchair cushion (measurements  PT)(youth sized RW)    Recommendations for Other Services OT consult     Precautions / Restrictions Precautions Precautions: Fall Restrictions Weight Bearing Restrictions: No      Mobility  Bed Mobility Overal bed mobility: Needs Assistance Bed Mobility: Supine to Sit;Sit to Supine     Supine to sit: Supervision;HOB elevated Sit to supine: Supervision;HOB elevated   General bed mobility comments: Increased effort and time for pt to perform on own; 2 assist to boost pt up in bed end of session  Transfers Overall transfer level: Needs assistance Equipment used: Rolling walker (2 wheeled) Transfers: Sit to/from Stand Sit to Stand: Min guard;Min assist         General transfer comment: min assist 1st trial standing from bed; CGA 2nd trial standing from bed; CGA standing from [facility]; vc's for UE placement and technique required  Ambulation/Gait Ambulation/Gait assistance: Min guard Gait Distance (Feet): (3 feet x2 (bed to/from [facility])) Assistive device: Rolling walker (2 wheeled)   Gait velocity: decreased   General Gait Details: decreased B step length/foot clearance/heelstrike; increased effort to perform  Stairs            Wheelchair Mobility    Modified Rankin (Stroke Patients Only)       Balance Overall balance assessment: Needs assistance Sitting-balance support: Feet supported;Single extremity supported Sitting balance-Leahy Scale: Poor Sitting balance - Comments: pt requiring single UE support for static sitting balance   Standing balance support: Bilateral upper extremity supported Standing balance-Leahy Scale: Poor Standing balance comment: pt requiring B UE support on RW for static standing balance (L lean noted initially upon standing requiring vc's and tactile cues to correct)  Pertinent Vitals/Pain Pain Assessment: No/denies pain  HR 100-110 bpm during session.    Home Living Family/patient expects to  be discharged to:: Private residence Living Arrangements: Spouse/significant other Available Help at Discharge: Family Type of Home: House Home Access: Stairs to enter Entrance Stairs-Rails: None Entrance Stairs-Number of Steps: 3 Home Layout: One level Home Equipment: Environmental consultant - 2 wheels;Cane - single point      Prior Function Level of Independence: Independent with assistive device(s)         Comments: Occasional use of SPC when not feeling well.     Hand Dominance        Extremity/Trunk Assessment   Upper Extremity Assessment Upper Extremity Assessment: Generalized weakness    Lower Extremity Assessment Lower Extremity Assessment: Generalized weakness    Cervical / Trunk Assessment Cervical / Trunk Assessment: Normal  Communication   Communication: No difficulties  Cognition Arousal/Alertness: Awake/alert Behavior During Therapy: WFL for tasks assessed/performed Overall Cognitive Status: (A&O x4)                                 General Comments: Increased time to respond and problem solve intermittently      General Comments   Nursing cleared pt for participation in physical therapy.  Pt agreeable to PT session.    Exercises  Transfer training with RW use   Assessment/Plan    PT Assessment Patient needs continued PT services  PT Problem List Decreased strength;Decreased activity tolerance;Decreased balance;Decreased mobility;Decreased knowledge of use of DME;Decreased knowledge of precautions;Cardiopulmonary status limiting activity       PT Treatment Interventions DME instruction;Gait training;Stair training;Functional mobility training;Therapeutic activities;Therapeutic exercise;Balance training;Patient/family education    PT Goals (Current goals can be found in the Care Plan section)  Acute Rehab PT Goals Patient Stated Goal: to improve strength and mobility PT Goal Formulation: With patient Time For Goal Achievement:  02/25/18 Potential to Achieve Goals: Good    Frequency Min 2X/week   Barriers to discharge Decreased caregiver support      Co-evaluation               AM-PAC PT "6 Clicks" Daily Activity  Outcome Measure Difficulty turning over in bed (including adjusting bedclothes, sheets and blankets)?: A Little Difficulty moving from lying on back to sitting on the side of the bed? : A Lot Difficulty sitting down on and standing up from a chair with arms (e.g., wheelchair, bedside commode, etc,.)?: Unable Help needed moving to and from a bed to chair (including a wheelchair)?: A Little Help needed walking in hospital room?: A Little Help needed climbing 3-5 steps with a railing? : A Lot 6 Click Score: 14    End of Session Equipment Utilized During Treatment: Gait belt;Oxygen(3 L O2 via nasal cannula) Activity Tolerance: Patient limited by fatigue Patient left: in bed;with call bell/phone within reach;with bed alarm set;with family/visitor present Nurse Communication: Mobility status;Precautions PT Visit Diagnosis: Other abnormalities of gait and mobility (R26.89);Muscle weakness (generalized) (M62.81);Difficulty in walking, not elsewhere classified (R26.2)    Time: 0973-5329 PT Time Calculation (min) (ACUTE ONLY): 27 min   Charges:   PT Evaluation $PT Eval Low Complexity: 1 Low PT Treatments $Therapeutic Activity: 8-22 mins      Leitha Bleak, PT 02/11/18, 10:26 AM [phone]

## 2018-02-11 NOTE — Care Management Note (Signed)
Case Management Note  Patient Details  Name: Belinda Jenkins MRN: [id number] Date of Birth: 09-Apr-1951  Subjective/Objective:      RNCM following up with patient about medication assistance.  List of resources given to patient, she can investigate to see if she qualifies for additional assistance.  She has Medicare and a prescription drug plan.     Resources given:    - Partnership for Prescription Assistance [phone]  -[employer] 579-704-8884 www.needymeds.org  -Social Security Administration  [employer].tn   With the [employer] can apply online for extra help with [employer] drug Costs  -RX Assist 803-800-1575 www.http://www.robinson.net/             Action/Plan: RNCM consult completed.  Signed off Doran Clay RN BSN [phone]  Expected Discharge Date:  02/11/18               Expected Discharge Plan:     In-House Referral:     Discharge planning Services  CM Consult  Post Acute Care Choice:    Choice offered to:     DME Arranged:    DME Agency:     HH Arranged:    HH Agency:     Status of Service:  In process, will continue to follow  If discussed at Long Length of Stay Meetings, dates discussed:    Additional Comments:  Shelbie Hutching, RN 02/11/2018, 9:27 AM

## 2018-02-11 NOTE — Care Management (Signed)
Discharge to home today per Dr. Manuella Ghazi. Will need home health services for COPD protocol and home nebulizer.  Discussed home health agencies, [place]. Floydene Flock, [facility] representative updated,  Shelbie Ammons RN MSN CCM Care Management [phone]

## 2018-02-12 LAB — GLUCOSE, CAPILLARY: Glucose-Capillary: 103 mg/dL — ABNORMAL HIGH (ref 70–99)

## 2018-02-12 MED ORDER — IPRATROPIUM-ALBUTEROL 0.5-2.5 (3) MG/3ML IN SOLN
3.0000 mL | Freq: Three times a day (TID) | RESPIRATORY_TRACT | Status: DC
  Administered 2018-02-12: 3 mL via RESPIRATORY_TRACT
  Filled 2018-02-12: qty 3

## 2018-02-12 MED ORDER — IPRATROPIUM-ALBUTEROL 0.5-2.5 (3) MG/3ML IN SOLN: 3.0000 mL | Freq: Four times a day (QID) | RESPIRATORY_TRACT | 1 refills | Status: DC

## 2018-02-12 MED ORDER — LEVOFLOXACIN 750 MG PO TABS: 750.0000 mg | ORAL_TABLET | Freq: Every day | ORAL | 0 refills | Status: AC

## 2018-02-12 MED ORDER — BUDESONIDE 0.5 MG/2ML IN SUSP: 0.5000 mg | Freq: Two times a day (BID) | RESPIRATORY_TRACT | 12 refills | Status: DC

## 2018-02-12 MED ORDER — PREDNISONE 10 MG (21) PO TBPK: ORAL_TABLET | ORAL | 0 refills | Status: DC

## 2018-02-12 NOTE — Progress Notes (Signed)
PT Cancellation Note  Patient Details Name: Belinda Jenkins MRN: [id number] DOB: 06-20-1950   Cancelled Treatment:    Reason Eval/Treat Not Completed: Other (comment).  Pt currently not in room.  Pt appears to have been discharged.  Leitha Bleak, PT 02/12/18, 11:15 AM [phone]

## 2018-02-12 NOTE — Progress Notes (Signed)
Received Md order to discharge patient to home, reviewed home meds discharge instructions  follow up appoints and prescriptions with patient and she verbalized understanding.  Patient cleared by care management to leave now

## 2018-02-12 NOTE — Plan of Care (Signed)
  Problem: Education: Goal: Knowledge of General Education information will improve Description: Including pain rating scale, medication(s)/side effects and non-pharmacologic comfort measures Outcome: Progressing   Problem: Elimination: Goal: Will not experience complications related to bowel motility Outcome: Progressing   

## 2018-02-12 NOTE — Care Management Note (Addendum)
Case Management Note  Patient Details  Name: Marquesa Rath MRN: [id number] Date of Birth: 1951-02-01  Subjective/Objective:  Patient to be discharged per MD order. Orders in place for home health services. Previous RNCM had began workup with [facility]. Family and patient still in agreement to this plan. COPD protocol and Trilogy form signed by MD. Referral confirmed with Corene Cornea from [facility]. DME nebulizer and hopeful trilogy. No further RNCM needs.Family to transport.    Update 10:50- Patient and family now completely decline all services. They will not allow anyone in the home. Family states they have "assembled a care team to be there with her all day". Patient in agreement to this plan and if she ever needs services she will only do outpatient. Completely denies that she would use NIV. MD and [facility] aware of update plan.  Family to provide discharge transport.                    Action/Plan:   Expected Discharge Date:  02/12/18               Expected Discharge Plan:  [place]  In-House Referral:     Discharge planning Services  CM Consult  Post Acute Care Choice:  Home Health, Durable Medical Equipment Choice offered to:  Patient, Adult Children  DME Arranged:  [facility] DME Agency:  [place] Arranged:  RN, PT, Nurse's Aide, NIV, Social Work, Research scientist (medical) Agency:  [place]  Status of Service:  Completed, signed off  If discussed at [employer], dates discussed:    Additional Comments:  Latanya Maudlin, RN 02/12/2018, 9:50 AM

## 2018-02-13 LAB — CULTURE, BLOOD (ROUTINE X 2)
CULTURE: NO GROWTH
Culture: NO GROWTH

## 2018-02-15 ENCOUNTER — Telehealth: Payer: Self-pay

## 2018-02-15 NOTE — Telephone Encounter (Signed)
LM for patient to schedule Hospital F/U apt.

## 2018-02-16 ENCOUNTER — Telehealth: Payer: Self-pay

## 2018-02-16 NOTE — Discharge Summary (Addendum)
[facility] NAME: Belinda Jenkins    MR#:  [id number]  DATE OF BIRTH:  09-May-1950  DATE OF ADMISSION:  02/08/2018   ADMITTING PHYSICIAN: Bettey Costa, MD  DATE OF DISCHARGE: 02/12/2018  1:10 PM  PRIMARY CARE PHYSICIAN: Tama High III, MD   ADMISSION DIAGNOSIS:  Respiratory failure ([place]) [J96.90] PNA (pneumonia) [J18.9] Acute respiratory failure with hypoxia and hypercapnia ([place]) [J96.01, J96.02] DISCHARGE DIAGNOSIS:  Active Problems:   Respiratory failure ([place])  SECONDARY DIAGNOSIS:   Past Medical History:  Diagnosis Date  . COPD (chronic obstructive pulmonary disease) ([place])   . Diabetes mellitus without complication ([place])   . Family history of adverse reaction to anesthesia    sister - slow to wake after 1 procedure  . Hypertension   . Vertigo    HOSPITAL COURSE:  67 year old female with chronic hypoxic respiratory failure due to COPD and diabetes admitted for SOB  1. Acute on chronic hypoxic/hypercarbic respiratory failure in the setting of COPD exacerbation and pneumonia with consolidation right middle lobe - At her baseline O2 and improved with nebs. Steroids.  2. Hyperkalemia: - Resolved with insulin and dextrose  3. Diabetes: controlled   4. Hypertension: stable  Patient very clearly expresses her wish that she would like to go HOME and not STR/SNF. I've requested HH,PT, RN, Trilogy & COPD protocol.  I had long talk with nursing, family and PCCM - I support patient's decision to be at home with support as she has decision making capacity. Family and patient in agreement although there seem to be some family dynamics and they may not allow HH to visit. DISCHARGE CONDITIONS:  fair CONSULTS OBTAINED:   DRUG ALLERGIES:   Allergies  Allergen Reactions  . Sulfamethoxazole-Trimethoprim Other (See Comments)    Sulfa causes tingling  . Morphine     Other reaction(s): Other (See  Comments) Morphine causes mind altering.  . Oxycodone-Acetaminophen     Other reaction(s): Other (See Comments) Percocet causes mind altering.  . Sertraline Other (See Comments)    Sertraline causes worsening depression.  . Adhesive [Tape] Rash    Some bandaids cause rash   DISCHARGE MEDICATIONS:   Allergies as of 02/12/2018      Reactions   Sulfamethoxazole-trimethoprim Other (See Comments)   Sulfa causes tingling   Morphine    Other reaction(s): Other (See Comments) Morphine causes mind altering.   Oxycodone-acetaminophen    Other reaction(s): Other (See Comments) Percocet causes mind altering.   Sertraline Other (See Comments)   Sertraline causes worsening depression.   Adhesive [tape] Rash   Some bandaids cause rash      Medication List    STOP taking these medications   ADVAIR DISKUS 250-50 MCG/DOSE Aepb Generic drug:  Fluticasone-Salmeterol     TAKE these medications   alendronate 70 MG tablet Commonly known as:  FOSAMAX Take 70 mg by mouth once a week.   ALPRAZolam 0.5 MG tablet Commonly known as:  XANAX Take 0.5 mg by mouth 2 (two) times daily.   budesonide 0.5 MG/2ML nebulizer solution Commonly known as:  PULMICORT Take 2 mLs (0.5 mg total) by nebulization 2 (two) times daily.   COMBIVENT RESPIMAT 20-100 MCG/ACT Aers respimat Generic drug:  Ipratropium-Albuterol Inhale 1 puff into the lungs 4 (four) times daily as needed for wheezing. What changed:  Another medication with the same name was changed. Make sure you understand how and when to take each.   ipratropium-albuterol 0.5-2.5 (3)  MG/3ML Soln Commonly known as:  DUONEB Inhale 3 mLs into the lungs 4 (four) times daily. AND Q 3 hrs prn (wheezing/sob) What changed:    when to take this  reasons to take this  additional instructions   levofloxacin 750 MG tablet Commonly known as:  LEVAQUIN Take 1 tablet (750 mg total) by mouth daily for 4 days.   metFORMIN 1000 MG tablet Commonly known as:   GLUCOPHAGE Take 1,000 mg by mouth 2 (two) times daily.   predniSONE 10 MG (21) Tbpk tablet Commonly known as:  STERAPRED UNI-PAK 21 TAB Start 60 mg once daily and taper 10 mg daily until done   valsartan 160 MG tablet Commonly known as:  DIOVAN Take 160 mg by mouth daily.   vitamin B-12 1000 MCG tablet Commonly known as:  CYANOCOBALAMIN Take 1,000 mcg by mouth daily.   Vitamin D3 2000 units capsule Take 2,000 Units by mouth daily.        DISCHARGE INSTRUCTIONS:   DIET:  Regular diet DISCHARGE CONDITION:  Good ACTIVITY:  Activity as tolerated OXYGEN:  Home Oxygen: Yes.    Oxygen Delivery: 3 liters via [place] DISCHARGE LOCATION:  home with HH, PT, RN - COPD protocol & [facility]  If you experience worsening of your admission symptoms, develop shortness of breath, life threatening emergency, suicidal or homicidal thoughts you must seek medical attention immediately by calling 911 or calling your MD immediately  if symptoms less severe.  You Must read complete instructions/literature along with all the possible adverse reactions/side effects for all the Medicines you take and that have been prescribed to you. Take any new Medicines after you have completely understood and accpet all the possible adverse reactions/side effects.   Please note  You were cared for by a hospitalist during your hospital stay. If you have any questions about your discharge medications or the care you received while you were in the hospital after you are discharged, you can call the unit and asked to speak with the hospitalist on call if the hospitalist that took care of you is not available. Once you are discharged, your primary care physician will handle any further medical issues. Please note that NO REFILLS for any discharge medications will be authorized once you are discharged, as it is imperative that you return to your primary care physician (or establish a relationship with a primary care physician  if you do not have one) for your aftercare needs so that they can reassess your need for medications and monitor your lab values.    On the day of Discharge:  VITAL SIGNS:  Blood pressure (!) 143/87, pulse (!) 101, temperature 98.4 F (36.9 C), temperature source Oral, resp. rate 18, height 5' 1.5" (1.562 m), weight 66.3 kg, SpO2 97 %. PHYSICAL EXAMINATION:  GENERAL:  67 y.o.-year-old patient lying in the bed with no acute distress.  EYES: Pupils equal, round, reactive to light and accommodation. No scleral icterus. Extraocular muscles intact.  HEENT: Head atraumatic, normocephalic. Oropharynx and nasopharynx clear.  NECK:  Supple, no jugular venous distention. No thyroid enlargement, no tenderness.  LUNGS: Normal breath sounds bilaterally, no wheezing, rales,rhonchi or crepitation. No use of accessory muscles of respiration.  CARDIOVASCULAR: S1, S2 normal. No murmurs, rubs, or gallops.  ABDOMEN: Soft, non-tender, non-distended. Bowel sounds present. No organomegaly or mass.  EXTREMITIES: No pedal edema, cyanosis, or clubbing.  NEUROLOGIC: Cranial nerves II through XII are intact. Muscle strength 5/5 in all extremities. Sensation intact. Gait not checked.  PSYCHIATRIC:  The patient is alert and oriented x 3.  SKIN: No obvious rash, lesion, or ulcer.  DATA REVIEW:   CBC Recent Labs  Lab 02/10/18 0507  WBC 7.2  HGB 9.2*  HCT 31.4*  PLT 244    Chemistries  Recent Labs  Lab 02/11/18 0543  NA 140  K 3.8  CL 96*  CO2 37*  GLUCOSE 144*  BUN 18  CREATININE 0.50  CALCIUM 8.5*  MG 1.8    Follow-up Information    Go to Adin Hector, MD.   Specialty:  Internal Medicine Why:  Office will call patient with appointment Contact information: [place] [address] [phone]           High risk for Readmissions  Management plans discussed with the patient, family and they are in agreement.  CODE STATUS: Prior   TOTAL  TIME TAKING CARE OF THIS PATIENT: 45 minutes.    Max Sane M.D on 02/16/2018 at 7:23 AM  Between 7am to 6pm - Pager - [phone]  After 6pm go to [email or web address] - password [email or web address] [facility]  [employer] [facility]  Office  [phone]  CC: Primary care physician; Adin Hector, MD   Note: This dictation was prepared with Dragon dictation along with smaller phrase technology. Any transcriptional errors that result from this process are unintentional.

## 2018-02-16 NOTE — Telephone Encounter (Signed)
Spoke to patient's daughter. They have f/u with Dr. Caryl Comes tomorrow and then will call us back to sch hosp f/u depending on what he says.

## 2018-02-25 ENCOUNTER — Emergency Department: Payer: Medicare Other

## 2018-02-25 ENCOUNTER — Encounter: Payer: Self-pay | Admitting: Emergency Medicine

## 2018-02-25 ENCOUNTER — Other Ambulatory Visit: Payer: Self-pay

## 2018-02-25 ENCOUNTER — Inpatient Hospital Stay
Admission: EM | Admit: 2018-02-25 | Discharge: 2018-03-03 | DRG: 190 | Disposition: A | Discharge status: Skilled Nursing Facility | Payer: Medicare Other | Attending: Internal Medicine | Admitting: Internal Medicine

## 2018-02-25 DIAGNOSIS — R0602 Shortness of breath: Secondary | ICD-10-CM | POA: Diagnosis present

## 2018-02-25 DIAGNOSIS — Z91048 Other nonmedicinal substance allergy status: Secondary | ICD-10-CM | POA: Diagnosis not present

## 2018-02-25 DIAGNOSIS — E785 Hyperlipidemia, unspecified: Secondary | ICD-10-CM | POA: Diagnosis present

## 2018-02-25 DIAGNOSIS — Z885 Allergy status to narcotic agent status: Secondary | ICD-10-CM | POA: Diagnosis not present

## 2018-02-25 DIAGNOSIS — J441 Chronic obstructive pulmonary disease with (acute) exacerbation: Secondary | ICD-10-CM | POA: Diagnosis present

## 2018-02-25 DIAGNOSIS — Z87891 Personal history of nicotine dependence: Secondary | ICD-10-CM

## 2018-02-25 DIAGNOSIS — D649 Anemia, unspecified: Secondary | ICD-10-CM | POA: Diagnosis not present

## 2018-02-25 DIAGNOSIS — E1165 Type 2 diabetes mellitus with hyperglycemia: Secondary | ICD-10-CM | POA: Diagnosis not present

## 2018-02-25 DIAGNOSIS — Z7951 Long term (current) use of inhaled steroids: Secondary | ICD-10-CM

## 2018-02-25 DIAGNOSIS — G9340 Encephalopathy, unspecified: Secondary | ICD-10-CM | POA: Diagnosis present

## 2018-02-25 DIAGNOSIS — Z79899 Other long term (current) drug therapy: Secondary | ICD-10-CM

## 2018-02-25 DIAGNOSIS — Z7983 Long term (current) use of bisphosphonates: Secondary | ICD-10-CM

## 2018-02-25 DIAGNOSIS — I10 Essential (primary) hypertension: Secondary | ICD-10-CM | POA: Diagnosis present

## 2018-02-25 DIAGNOSIS — G4733 Obstructive sleep apnea (adult) (pediatric): Secondary | ICD-10-CM | POA: Diagnosis present

## 2018-02-25 DIAGNOSIS — I471 Supraventricular tachycardia: Secondary | ICD-10-CM | POA: Diagnosis not present

## 2018-02-25 DIAGNOSIS — I4510 Unspecified right bundle-branch block: Secondary | ICD-10-CM | POA: Diagnosis present

## 2018-02-25 DIAGNOSIS — T380X5A Adverse effect of glucocorticoids and synthetic analogues, initial encounter: Secondary | ICD-10-CM | POA: Diagnosis not present

## 2018-02-25 DIAGNOSIS — J9622 Acute and chronic respiratory failure with hypercapnia: Secondary | ICD-10-CM | POA: Diagnosis present

## 2018-02-25 DIAGNOSIS — Z882 Allergy status to sulfonamides status: Secondary | ICD-10-CM

## 2018-02-25 DIAGNOSIS — J9602 Acute respiratory failure with hypercapnia: Secondary | ICD-10-CM

## 2018-02-25 DIAGNOSIS — R531 Weakness: Secondary | ICD-10-CM | POA: Diagnosis present

## 2018-02-25 DIAGNOSIS — Z7984 Long term (current) use of oral hypoglycemic drugs: Secondary | ICD-10-CM | POA: Diagnosis not present

## 2018-02-25 DIAGNOSIS — Z9981 Dependence on supplemental oxygen: Secondary | ICD-10-CM

## 2018-02-25 DIAGNOSIS — Z888 Allergy status to other drugs, medicaments and biological substances status: Secondary | ICD-10-CM

## 2018-02-25 DIAGNOSIS — J9621 Acute and chronic respiratory failure with hypoxia: Secondary | ICD-10-CM | POA: Diagnosis present

## 2018-02-25 DIAGNOSIS — Z66 Do not resuscitate: Secondary | ICD-10-CM | POA: Diagnosis present

## 2018-02-25 LAB — URINE DRUG SCREEN, QUALITATIVE (ARMC ONLY)
AMPHETAMINES, UR SCREEN: NOT DETECTED
Barbiturates, Ur Screen: NOT DETECTED
Benzodiazepine, Ur Scrn: POSITIVE — AB
Cannabinoid 50 Ng, Ur ~~LOC~~: NOT DETECTED
Cocaine Metabolite,Ur ~~LOC~~: NOT DETECTED
MDMA (ECSTASY) UR SCREEN: NOT DETECTED
METHADONE SCREEN, URINE: NOT DETECTED
OPIATE, UR SCREEN: NOT DETECTED
PHENCYCLIDINE (PCP) UR S: NOT DETECTED
Tricyclic, Ur Screen: NOT DETECTED

## 2018-02-25 LAB — BLOOD GAS, ARTERIAL
Acid-Base Excess: 16.9 mmol/L — ABNORMAL HIGH (ref 0.0–2.0)
Bicarbonate: 42.7 mmol/L — ABNORMAL HIGH (ref 20.0–28.0)
DELIVERY SYSTEMS: POSITIVE
EXPIRATORY PAP: 5
FIO2: 0.35
Inspiratory PAP: 12
O2 Saturation: 98.9 %
PCO2 ART: 56 mmHg — AB (ref 32.0–48.0)
PH ART: 7.49 — AB (ref 7.350–7.450)
Patient temperature: 37
pO2, Arterial: 120 mmHg — ABNORMAL HIGH (ref 83.0–108.0)

## 2018-02-25 LAB — TROPONIN I: Troponin I: <0.03 ng/mL (ref <0.03)

## 2018-02-25 LAB — TYPE AND SCREEN
ABO/RH(D): A POS
Antibody Screen: NEGATIVE

## 2018-02-25 LAB — URINALYSIS, COMPLETE (UACMP) WITH MICROSCOPIC
Bacteria, UA: NONE SEEN
Bilirubin Urine: NEGATIVE
Hgb urine dipstick: NEGATIVE
Ketones, ur: 5 mg/dL — AB
Leukocytes, UA: NEGATIVE
NITRITE: NEGATIVE
PH: 5 (ref 5.0–8.0)
Protein, ur: NEGATIVE mg/dL
Specific Gravity, Urine: 1.028 (ref 1.005–1.030)

## 2018-02-25 LAB — COMPREHENSIVE METABOLIC PANEL
ALK PHOS: 52 U/L (ref 38–126)
ALT: 14 U/L (ref 0–44)
ANION GAP: 10 (ref 5–15)
AST: 17 U/L (ref 15–41)
Albumin: 4.3 g/dL (ref 3.5–5.0)
BUN: 20 mg/dL (ref 8–23)
CALCIUM: 9.1 mg/dL (ref 8.9–10.3)
CHLORIDE: 89 mmol/L — AB (ref 98–111)
CO2: 41 mmol/L — ABNORMAL HIGH (ref 22–32)
CREATININE: 0.49 mg/dL (ref 0.44–1.00)
Glucose, Bld: 229 mg/dL — ABNORMAL HIGH (ref 70–99)
Potassium: 4.6 mmol/L (ref 3.5–5.1)
Sodium: 140 mmol/L (ref 135–145)
Total Bilirubin: 0.7 mg/dL (ref 0.3–1.2)
Total Protein: 7.5 g/dL (ref 6.5–8.1)

## 2018-02-25 LAB — PROTIME-INR
INR: 0.84
Prothrombin Time: 11.4 seconds (ref 11.4–15.2)

## 2018-02-25 LAB — CBC WITH DIFFERENTIAL/PLATELET
Abs Immature Granulocytes: 0.12 10*3/uL — ABNORMAL HIGH (ref 0.00–0.07)
BASOS ABS: 0 10*3/uL (ref 0.0–0.1)
Basophils Relative: 0 %
EOS ABS: 0 10*3/uL (ref 0.0–0.5)
Eosinophils Relative: 0 %
HEMATOCRIT: 39.3 % (ref 36.0–46.0)
Hemoglobin: 11.3 g/dL — ABNORMAL LOW (ref 12.0–15.0)
Immature Granulocytes: 1 %
LYMPHS ABS: 1 10*3/uL (ref 0.7–4.0)
Lymphocytes Relative: 9 %
MCH: 27.6 pg (ref 26.0–34.0)
MCHC: 28.8 g/dL — ABNORMAL LOW (ref 30.0–36.0)
MCV: 95.9 fL (ref 80.0–100.0)
MONOS PCT: 9 %
Monocytes Absolute: 0.9 10*3/uL (ref 0.1–1.0)
NEUTROS PCT: 81 %
NRBC: 0 % (ref 0.0–0.2)
Neutro Abs: 8.9 10*3/uL — ABNORMAL HIGH (ref 1.7–7.7)
Platelets: 316 10*3/uL (ref 150–400)
RBC: 4.1 MIL/uL (ref 3.87–5.11)
RDW: 13.3 % (ref 11.5–15.5)
WBC: 11 10*3/uL — ABNORMAL HIGH (ref 4.0–10.5)

## 2018-02-25 LAB — BLOOD GAS, VENOUS
ACID-BASE EXCESS: 17.1 mmol/L — AB (ref 0.0–2.0)
Bicarbonate: 48.9 mmol/L — ABNORMAL HIGH (ref 20.0–28.0)
FIO2: 1
O2 SAT: 99.4 %
Patient temperature: 37
pCO2, Ven: 109 mmHg (ref 44.0–60.0)
pH, Ven: 7.26 (ref 7.250–7.430)
pO2, Ven: 170 mmHg — ABNORMAL HIGH (ref 32.0–45.0)

## 2018-02-25 LAB — INFLUENZA PANEL BY PCR (TYPE A & B)
INFLBPCR: NEGATIVE
Influenza A By PCR: NEGATIVE

## 2018-02-25 LAB — PROCALCITONIN

## 2018-02-25 LAB — MRSA PCR SCREENING: MRSA by PCR: NEGATIVE

## 2018-02-25 LAB — GLUCOSE, CAPILLARY
GLUCOSE-CAPILLARY: 176 mg/dL — AB (ref 70–99)
GLUCOSE-CAPILLARY: 266 mg/dL — AB (ref 70–99)
Glucose-Capillary: 179 mg/dL — ABNORMAL HIGH (ref 70–99)
Glucose-Capillary: 196 mg/dL — ABNORMAL HIGH (ref 70–99)

## 2018-02-25 LAB — CG4 I-STAT (LACTIC ACID): Lactic Acid, Venous: 1.2 mmol/L (ref 0.5–1.9)

## 2018-02-25 LAB — MAGNESIUM: MAGNESIUM: 1.7 mg/dL (ref 1.7–2.4)

## 2018-02-25 MED ORDER — METOPROLOL TARTRATE 25 MG PO TABS
12.5000 mg | ORAL_TABLET | Freq: Two times a day (BID) | ORAL | Status: DC
  Administered 2018-02-25 – 2018-03-03 (×12): 12.5 mg via ORAL
  Filled 2018-02-25 (×12): qty 1

## 2018-02-25 MED ORDER — ALBUTEROL SULFATE (2.5 MG/3ML) 0.083% IN NEBU: 2.5000 mg | INHALATION_SOLUTION | RESPIRATORY_TRACT | Status: DC | PRN

## 2018-02-25 MED ORDER — ONDANSETRON HCL 4 MG/2ML IJ SOLN: 4.0000 mg | Freq: Four times a day (QID) | INTRAMUSCULAR | Status: DC | PRN

## 2018-02-25 MED ORDER — IRBESARTAN 150 MG PO TABS
150.0000 mg | ORAL_TABLET | Freq: Every day | ORAL | Status: DC
  Administered 2018-02-26 – 2018-03-03 (×6): 150 mg via ORAL
  Filled 2018-02-25 (×6): qty 1

## 2018-02-25 MED ORDER — BUDESONIDE 0.5 MG/2ML IN SUSP
0.5000 mg | Freq: Two times a day (BID) | RESPIRATORY_TRACT | Status: DC
  Administered 2018-02-25 – 2018-03-03 (×12): 0.5 mg via RESPIRATORY_TRACT
  Filled 2018-02-25 (×12): qty 2

## 2018-02-25 MED ORDER — IPRATROPIUM-ALBUTEROL 0.5-2.5 (3) MG/3ML IN SOLN
RESPIRATORY_TRACT | Status: AC
  Administered 2018-02-25: 3 mL via RESPIRATORY_TRACT
  Filled 2018-02-25: qty 9

## 2018-02-25 MED ORDER — HYDRALAZINE HCL 20 MG/ML IJ SOLN: 10.0000 mg | Freq: Four times a day (QID) | INTRAMUSCULAR | Status: DC | PRN

## 2018-02-25 MED ORDER — FUROSEMIDE 10 MG/ML IJ SOLN
20.0000 mg | Freq: Once | INTRAMUSCULAR | Status: AC
  Administered 2018-02-25: 20 mg via INTRAVENOUS
  Filled 2018-02-25: qty 2

## 2018-02-25 MED ORDER — SENNOSIDES-DOCUSATE SODIUM 8.6-50 MG PO TABS
1.0000 | ORAL_TABLET | Freq: Every evening | ORAL | Status: DC | PRN
  Administered 2018-03-03: 1 via ORAL
  Filled 2018-02-25: qty 1

## 2018-02-25 MED ORDER — INSULIN ASPART 100 UNIT/ML ~~LOC~~ SOLN
0.0000 [IU] | Freq: Three times a day (TID) | SUBCUTANEOUS | Status: DC
  Administered 2018-02-25 – 2018-02-26 (×2): 2 [IU] via SUBCUTANEOUS
  Administered 2018-02-26: 3 [IU] via SUBCUTANEOUS
  Filled 2018-02-25 (×2): qty 1

## 2018-02-25 MED ORDER — ALPRAZOLAM 0.5 MG PO TABS
0.5000 mg | ORAL_TABLET | Freq: Two times a day (BID) | ORAL | Status: DC | PRN
  Administered 2018-02-25 – 2018-03-02 (×6): 0.5 mg via ORAL
  Filled 2018-02-25 (×6): qty 1

## 2018-02-25 MED ORDER — IPRATROPIUM-ALBUTEROL 0.5-2.5 (3) MG/3ML IN SOLN
3.0000 mL | Freq: Four times a day (QID) | RESPIRATORY_TRACT | Status: DC
  Administered 2018-02-25: 3 mL via RESPIRATORY_TRACT
  Filled 2018-02-25: qty 3

## 2018-02-25 MED ORDER — METHYLPREDNISOLONE SODIUM SUCC 40 MG IJ SOLR
40.0000 mg | Freq: Four times a day (QID) | INTRAMUSCULAR | Status: DC
  Administered 2018-02-25 – 2018-02-26 (×4): 40 mg via INTRAVENOUS
  Filled 2018-02-25 (×4): qty 1

## 2018-02-25 MED ORDER — IPRATROPIUM-ALBUTEROL 0.5-2.5 (3) MG/3ML IN SOLN
3.0000 mL | Freq: Four times a day (QID) | RESPIRATORY_TRACT | Status: DC
  Administered 2018-02-25 – 2018-02-27 (×6): 3 mL via RESPIRATORY_TRACT
  Filled 2018-02-25 (×7): qty 3

## 2018-02-25 MED ORDER — METHYLPREDNISOLONE SODIUM SUCC 125 MG IJ SOLR
INTRAMUSCULAR | Status: AC
  Administered 2018-02-25: 125 mg via INTRAVENOUS
  Filled 2018-02-25: qty 2

## 2018-02-25 MED ORDER — INSULIN ASPART 100 UNIT/ML ~~LOC~~ SOLN
0.0000 [IU] | Freq: Every day | SUBCUTANEOUS | Status: DC
  Administered 2018-02-25: 3 [IU] via SUBCUTANEOUS
  Administered 2018-02-26: 2 [IU] via SUBCUTANEOUS
  Administered 2018-02-27 – 2018-03-01 (×3): 3 [IU] via SUBCUTANEOUS
  Filled 2018-02-25 (×5): qty 1

## 2018-02-25 MED ORDER — METHYLPREDNISOLONE SODIUM SUCC 125 MG IJ SOLR
125.0000 mg | Freq: Once | INTRAMUSCULAR | Status: AC
  Administered 2018-02-25: 125 mg via INTRAVENOUS

## 2018-02-25 MED ORDER — GUAIFENESIN-DM 100-10 MG/5ML PO SYRP
5.0000 mL | ORAL_SOLUTION | ORAL | Status: DC | PRN
  Filled 2018-02-25: qty 5

## 2018-02-25 MED ORDER — IPRATROPIUM-ALBUTEROL 0.5-2.5 (3) MG/3ML IN SOLN
3.0000 mL | Freq: Once | RESPIRATORY_TRACT | Status: AC
  Administered 2018-02-25: 3 mL via RESPIRATORY_TRACT

## 2018-02-25 MED ORDER — ENOXAPARIN SODIUM 40 MG/0.4ML ~~LOC~~ SOLN
40.0000 mg | SUBCUTANEOUS | Status: DC
  Administered 2018-02-25 – 2018-03-02 (×6): 40 mg via SUBCUTANEOUS
  Filled 2018-02-25 (×6): qty 0.4

## 2018-02-25 MED ORDER — BISACODYL 5 MG PO TBEC: 5.0000 mg | DELAYED_RELEASE_TABLET | Freq: Every day | ORAL | Status: DC | PRN

## 2018-02-25 MED ORDER — ONDANSETRON HCL 4 MG PO TABS: 4.0000 mg | ORAL_TABLET | Freq: Four times a day (QID) | ORAL | Status: DC | PRN

## 2018-02-25 NOTE — Progress Notes (Signed)
Advanced Care Plan.  Purpose of Encounter: CODE STATUS Parties in Attendance: The patient, her husband and daughter, me. Patient's Decisional Capacity: Yes. Medical Story: Belinda Jenkins  is a 67 y.o. female with a known history of COPD, chronic respiratory failure on home oxygen 3 L, hypertension, diabetes, recent pneumonia.  The patient is being admitted for acute on chronic respiratory failure with hypercapnia due to COPD exacerbation.  She is on BiPAP and will be admitted to stepdown unit.  I discussed with the patient about her current condition, prognosis and CODE STATUS.  The patient stated that she does not want to be resuscitated or intubated if she has a cardiopulmonary arrest. Plan:  Code Status: DNR. Time spent discussing advance care planning: 18 minutes.

## 2018-02-25 NOTE — ED Provider Notes (Signed)
[facility] Emergency Department Provider Note  ____________________________________________   I have reviewed the triage vital signs and the nursing notes. Where available I have reviewed prior notes and, if possible and indicated, outside hospital notes.    HISTORY  Chief Complaint Weakness and Shortness of Breath    HPI Belinda Jenkins is a 67 y.o. female  With a history of COPD on home oxygen, history is very limited, she is with family members friend, family members are not yet here.  Patient does have a history of pneumonia in October for which it appears she was intubated, patient herself cannot give much of a history.  Apparently, she became weak and short of breath this morning.  Oxygen saturations were low in triage.  Patient states that she is not having any chest pain or leg swelling.  Ported fevers, states "I just do not feel good" no known history of rectal bleeding. Level 5 chart caveat; no further history available due to patient status. He did require intubation for hypercapnia and hypoxia in October.  Past Medical History:  Diagnosis Date  . COPD (chronic obstructive pulmonary disease) ([place])   . Diabetes mellitus without complication ([place])   . Family history of adverse reaction to anesthesia    sister - slow to wake after 1 procedure  . Hypertension   . Vertigo     Patient Active Problem List   Diagnosis Date Noted  . Respiratory failure ([place]) 02/08/2018  . Hx of colonic polyps   . Benign neoplasm of rectosigmoid junction     Past Surgical History:  Procedure Laterality Date  . ABDOMINAL HYSTERECTOMY    . BACK SURGERY     L5  . BLEPHAROPLASTY Bilateral   . BREAST SURGERY     lumpectomy(x1), milk duct(x1)  . CATARACT EXTRACTION W/ INTRAOCULAR LENS  IMPLANT, BILATERAL    . COLONOSCOPY WITH PROPOFOL N/A 07/14/2016   Procedure: COLONOSCOPY WITH PROPOFOL;  Surgeon: Lucilla Lame, MD;  Location: [facility];  Service:  Endoscopy;  Laterality: N/A;  Diabetic - oral meds requests arrival around 8 AM  . HIP SURGERY Left   . POLYPECTOMY  07/14/2016   Procedure: POLYPECTOMY;  Surgeon: Lucilla Lame, MD;  Location: [facility];  Service: Endoscopy;;    Prior to Admission medications   Medication Sig Start Date End Date Taking? Authorizing Provider  alendronate (FOSAMAX) 70 MG tablet Take 70 mg by mouth once a week.     [provider]  ALPRAZolam Duanne Moron) 0.5 MG tablet Take 0.5 mg by mouth 2 (two) times daily.     [provider]  budesonide (PULMICORT) 0.5 MG/2ML nebulizer solution Take 2 mLs (0.5 mg total) by nebulization 2 (two) times daily. 02/12/18   Max Sane, MD  Cholecalciferol (VITAMIN D3) 2000 units capsule Take 2,000 Units by mouth daily.    [provider]  Ipratropium-Albuterol (COMBIVENT RESPIMAT) 20-100 MCG/ACT AERS respimat Inhale 1 puff into the lungs 4 (four) times daily as needed for wheezing.     [provider]  ipratropium-albuterol (DUONEB) 0.5-2.5 (3) MG/3ML SOLN Inhale 3 mLs into the lungs 4 (four) times daily. AND Q 3 hrs prn (wheezing/sob) 02/12/18   Max Sane, MD  metFORMIN (GLUCOPHAGE) 1000 MG tablet Take 1,000 mg by mouth 2 (two) times daily.     [provider]  predniSONE (STERAPRED UNI-PAK 21 TAB) 10 MG (21) TBPK tablet Start 60 mg once daily and taper 10 mg daily until done 02/12/18   Manuella Ghazi,  Vipul, MD  valsartan (DIOVAN) 160 MG tablet Take 160 mg by mouth daily.    [provider]  vitamin B-12 (CYANOCOBALAMIN) 1000 MCG tablet Take 1,000 mcg by mouth daily.    [provider]    Allergies Sulfamethoxazole-trimethoprim; Morphine; Oxycodone-acetaminophen; Sertraline; and Adhesive [tape]  History reviewed. No pertinent family history.  Social History Social History   Tobacco Use  . Smoking status: Former Smoker    Last attempt to quit: 04/15/2003    Years since quitting: 14.8  . Smokeless tobacco: Never Used   Substance Use Topics  . Alcohol use: Yes    Alcohol/week: 1.0 standard drinks    Types: 1 Shots of liquor per week  . Drug use: Never    Review of Systems Constitutional: No fever/chills Eyes: No visual changes. ENT: No sore throat. No stiff neck no neck pain Cardiovascular: Denies chest pain. Respiratory: Denies shortness of breath. Gastrointestinal:   no vomiting.  No diarrhea.  No constipation. Genitourinary: Negative for dysuria. Musculoskeletal: Negative lower extremity swelling Skin: Negative for rash. Neurological: Negative for severe headaches, focal weakness or numbness.   ____________________________________________   PHYSICAL EXAM:  VITAL SIGNS: ED Triage Vitals  Enc Vitals Group     BP 02/25/18 1258 (!) 179/85     Pulse Rate 02/25/18 1250 (!) 105     Resp 02/25/18 1250 (!) 35     Temp --      Temp src --      SpO2 02/25/18 1250 92 %     Weight 02/25/18 1251 145 lb 8.1 oz (66 kg)     Height 02/25/18 1251 5\' 1"  (1.549 m)     Head Circumference --      Peak Flow --      Pain Score 02/25/18 1251 0     Pain Loc --      Pain Edu? --      Excl. in [place]? --     Constitutional: Patient is awake but has to be prompted her answer questions.  Oriented to name and place unsure of the date.  Quite ill in appearance Eyes: Conjunctivae are normal Head: Atraumatic HEENT: No congestion/rhinnorhea. Mucous membranes are moist.  Oropharynx non-erythematous Neck:   Nontender with no meningismus, no masses, no stridor Cardiovascular: Normal rate, regular rhythm. Grossly normal heart sounds.  Good peripheral circulation. Respiratory: Diminished in the bases, occasional wheeze Abdominal: Soft and nontender. No distention. No guarding no rebound Back:  There is no focal tenderness or step off.  there is no midline tenderness there are no lesions noted. there is no CVA tenderness Musculoskeletal: No lower extremity tenderness, no upper extremity tenderness. No joint effusions,  no DVT signs strong distal pulses no edema Neurologic:  Normal speech and language. No gross focal neurologic deficits are appreciated.  Skin:  Skin is warm, dry and intact. No rash noted. Psychiatric: Mood and affect are normal. Speech and behavior are normal.  ____________________________________________   LABS (all labs ordered are listed, but only abnormal results are displayed)  Labs Reviewed  BLOOD GAS, VENOUS - Abnormal; Notable for the following components:      Result Value   pCO2, Ven 109 (*)    pO2, Ven 170.0 (*)    Bicarbonate 48.9 (*)    Acid-Base Excess 17.1 (*)    All other components within normal limits  CBC WITH DIFFERENTIAL/PLATELET  PROTIME-INR  TROPONIN I  LACTIC ACID, PLASMA  LACTIC ACID, PLASMA  COMPREHENSIVE METABOLIC PANEL  URINALYSIS, COMPLETE (UACMP)  WITH MICROSCOPIC  CG4 I-STAT (LACTIC ACID)  CBG MONITORING, ED  TYPE AND SCREEN    Pertinent labs  results that were available during my care of the patient were reviewed by me and considered in my medical decision making (see chart for details). ____________________________________________  EKG  I personally interpreted any EKGs ordered by me or triage Tachycardia rate 104 RBB no acute ST elevation or depression, normal axis, ____________________________________________  RADIOLOGY  Pertinent labs & imaging results that were available during my care of the patient were reviewed by me and considered in my medical decision making (see chart for details). If possible, patient and/or family made aware of any abnormal findings.  No results found. ____________________________________________    PROCEDURES  Procedure(s) performed: None  Procedures  Critical Care performed: CRITICAL CARE Performed by: Schuyler Amor   Total critical care time: 45 minutes  Critical care time was exclusive of separately billable procedures and treating other patients.  Critical care was necessary to treat or  prevent imminent or life-threatening deterioration.  Critical care was time spent personally by me on the following activities: development of treatment plan with patient and/or surrogate as well as nursing, discussions with consultants, evaluation of patient's response to treatment, examination of patient, obtaining history from patient or surrogate, ordering and performing treatments and interventions, ordering and review of laboratory studies, ordering and review of radiographic studies, pulse oximetry and re-evaluation of patient's condition.   ____________________________________________   INITIAL IMPRESSION / ASSESSMENT AND PLAN / ED COURSE  Pertinent labs & imaging results that were available during my care of the patient were reviewed by me and considered in my medical decision making (see chart for details).  I did a rectal exam on this patient, she is guaiac negative, patient is pale however.  Her initial sats were quite low, given oxygen we were able to bring it up very rapidly to 100% we will try to wean that down as possible.  Patient has increased respiratory rate and appears acutely ill.  I did a stat VBG which shows retained CO2.  We did immediately start her on BiPAP.  Her color is already improving, we are going to admit her to the hospital.  This does appear to be a hypercapnic issue I merrily.  There is no evidence of sepsis, or acute decomp stated heart failure, those blood tests are pending, chest x-ray to my read does not show any evidence of a pneumothorax or acute infiltrate of any size.  Signed out to the hospitalist at this time    ____________________________________________   FINAL CLINICAL IMPRESSION(S) / ED DIAGNOSES  Final diagnoses:  SOB (shortness of breath)      This chart was dictated using voice recognition software.  Despite best efforts to proofread,  errors can occur which can change meaning.      Schuyler Amor, MD 02/25/18 1322

## 2018-02-25 NOTE — ED Triage Notes (Addendum)
Pt to ED from home c/o weakness that became severe today and SOB.  States difficult to arouse this morning by husband.  Pt presents to ED very lethargic but alert, pale, clammy, oxygen saturation 60% 2L [place].  Pt placed on 15L NRB and brought back to room 1, Dr. Burlene Arnt at bedside.

## 2018-02-25 NOTE — ED Notes (Addendum)
Attempt to call report x1, unsuccessful, unit not ready to receive pt per unit secretary.

## 2018-02-25 NOTE — Consult Note (Addendum)
Name: Belinda Jenkins MRN: [id number] DOB: Oct 09, 1950    ADMISSION DATE:  02/25/2018 CONSULTATION DATE: 02/25/2018  REFERRING MD : Dr. Bridgett Larsson  CHIEF COMPLAINT: Weakness   BRIEF PATIENT DESCRIPTION:  67 yo female admitted with acute encephalopathy and acute on chronic hypercapnic hypoxic respiratory failure secondary to AECOPD requiring Bipap   SIGNIFICANT EVENTS  11/14-Pt admitted to the stepdown unit on Bipap   HISTORY OF PRESENT ILLNESS:   This is a 67 yo female with a PMH of Vertigo, HTN, Type II Diabetes Mellitus, and COPD.  She presented to [facility] ER on 11/14 with worsening weakness and shortness of breath. She was recently discharge from [facility] on 02/12/18 following treatment of pneumonia.  Per ER notes the pts husband stated the morning of 11/14 the pt was difficult to arouse.  Pts daughter states she does not think she has been receiving her scheduled duoneb treatments.  Upon arrival to the ER the pt was lethargic and hypoxic with O2 sats 60% on 2L via nasal canula, therefore she was placed on NRB. She remained tachypneic vbg revealed pH 7.26/pCO106, therefore she was transitioned to Bipap.  She received 125 mg iv solumedrol and duoneb x1. CXR negative, troponin <0.03, lactic acid 1.20, wbc 11.0, and UA negative for UTI.  EKG revealed sinus tachycardia, hr 104, RBBB, and mild ST elevation similar to previous EKG's. She was subsequently admitted to the stepdown unit for additional workup and treatment.  PAST MEDICAL HISTORY :   has a past medical history of COPD (chronic obstructive pulmonary disease) ([place]), Diabetes mellitus without complication ([place]), Family history of adverse reaction to anesthesia, Hypertension, and Vertigo.  has a past surgical history that includes Abdominal hysterectomy; Hip surgery (Left); Back surgery; Cataract extraction w/ intraocular lens  implant, bilateral; Blepharoplasty (Bilateral); Breast surgery; Colonoscopy with propofol (N/A, 07/14/2016); and  polypectomy (07/14/2016). Prior to Admission medications   Medication Sig Start Date End Date Taking? Authorizing Provider  alendronate (FOSAMAX) 70 MG tablet Take 70 mg by mouth once a week.    Yes [provider]  ALPRAZolam (XANAX) 0.25 MG tablet Take 0.5 mg by mouth 2 (two) times daily.    Yes [provider]  budesonide (PULMICORT) 0.5 MG/2ML nebulizer solution Take 2 mLs (0.5 mg total) by nebulization 2 (two) times daily. 02/12/18  Yes Max Sane, MD  Cholecalciferol (VITAMIN D3) 2000 units capsule Take 2,000 Units by mouth daily.   Yes [provider]  ipratropium-albuterol (DUONEB) 0.5-2.5 (3) MG/3ML SOLN Inhale 3 mLs into the lungs 4 (four) times daily. AND Q 3 hrs prn (wheezing/sob) 02/12/18  Yes Max Sane, MD  metFORMIN (GLUCOPHAGE) 1000 MG tablet Take 1,000 mg by mouth 2 (two) times daily.    Yes [provider]  metoprolol tartrate (LOPRESSOR) 25 MG tablet Take 12.5 mg by mouth 2 (two) times daily. 02/17/18  Yes [provider]  valsartan (DIOVAN) 160 MG tablet Take 160 mg by mouth daily.   Yes [provider]  vitamin B-12 (CYANOCOBALAMIN) 1000 MCG tablet Take 1,000 mcg by mouth daily.   Yes [provider]  predniSONE (STERAPRED UNI-PAK 21 TAB) 10 MG (21) TBPK tablet Start 60 mg once daily and taper 10 mg daily until done Patient not taking: Reported on 02/25/2018 02/12/18   Max Sane, MD   Allergies  Allergen Reactions  . Sulfamethoxazole-Trimethoprim Other (See Comments)    Sulfa causes tingling  . Morphine     Other reaction(s): Other (See Comments) Morphine causes mind altering.  Marland Kitchen  Oxycodone-Acetaminophen     Other reaction(s): Other (See Comments) Percocet causes mind altering.  . Sertraline Other (See Comments)    Sertraline causes worsening depression.  . Adhesive [Tape] Rash    Some bandaids cause rash    FAMILY HISTORY:  family history is not on file. SOCIAL HISTORY:  reports that she quit smoking  about 14 years ago. She has never used smokeless tobacco. She reports that she drinks about 1.0 standard drinks of alcohol per week. She reports that she does not use drugs.  REVIEW OF SYSTEMS: Positives in BOLD  Constitutional: Negative for fever, chills, weight loss, malaise/fatigue and diaphoresis.  HENT: Negative for hearing loss, ear pain, nosebleeds, congestion, sore throat, neck pain, tinnitus and ear discharge.   Eyes: Negative for blurred vision, double vision, photophobia, pain, discharge and redness.  Respiratory: Negative for cough, hemoptysis, sputum production, shortness of breath, wheezing and stridor.   Cardiovascular: Negative for chest pain, palpitations, orthopnea, claudication, leg swelling and PND.  Gastrointestinal: Negative for heartburn, nausea, vomiting, abdominal pain, diarrhea, constipation, blood in stool and melena.  Genitourinary: Negative for dysuria, urgency, frequency, hematuria and flank pain.  Musculoskeletal: Negative for myalgias, back pain, joint pain and falls.  Skin: Negative for itching and rash.  Neurological: Negative for dizziness, tingling, tremors, sensory change, speech change, focal weakness, seizures, loss of consciousness, weakness and headaches.  Endo/Heme/Allergies: Negative for environmental allergies and polydipsia. Does not bruise/bleed easily.  SUBJECTIVE:  No complaints at this time  VITAL SIGNS: Pulse Rate:  [94-105] 94 (11/14 1400) Resp:  [26-35] 29 (11/14 1400) BP: (138-182)/(64-89) 138/75 (11/14 1400) SpO2:  [92 %-100 %] 92 % (11/14 1400) FiO2 (%):  [30 %] 30 % (11/14 1337) Weight:  [66 kg] 66 kg (11/14 1251)  PHYSICAL EXAMINATION: General: well developed, well nourished female, NAD on Bipap  Neuro: alert and oriented, follows commands, PERRL  HEENT: supple, no JVD  Cardiovascular: nsr, rrr, no R/G Lungs: diminished throughout, even, non labored  Abdomen: +BS x4, obese, soft, non tender, non distended  Musculoskeletal:  normal bulk and tone, no edema  Skin: intact no rashes or lesions present   Recent Labs  Lab 02/25/18 1259  NA 140  K 4.6  CL 89*  CO2 41*  BUN 20  CREATININE 0.49  GLUCOSE 229*   Recent Labs  Lab 02/25/18 1259  HGB 11.3*  HCT 39.3  WBC 11.0*  PLT 316   Dg Chest Port 1 View  Result Date: 02/25/2018 CLINICAL DATA:  Shortness of breath EXAM: PORTABLE CHEST 1 VIEW COMPARISON:  02/10/2018 FINDINGS: Mild hyperinflation. Heart is borderline in size. No confluent airspace opacities, effusions or edema. No acute bony abnormality. IMPRESSION: Mild hyperinflation.  No active disease. Electronically Signed   By: Rolm Baptise M.D.   On: 02/25/2018 13:20    ASSESSMENT / PLAN:  Acute on chronic hypoxic hypercapnic respiratory failure secondary to AECOPD Prn Bipap for dyspnea and/or hypoxia  Scheduled and prn bronchodilator therapy IV and nebulized steroids  Repeat ABG CXR in am  Will likely need nocturnal Bipap due to chronic hypercapnia which will reduce frequent hospitalization  Hypertension Continuous telemetry monitoring  Continue outpatient avapro and metoprolol Prn hydralazine for bp management  Type II Diabetes Mellitus CBG's ac/hs SSI   VTE px: subq lovenox   Marda Stalker, [place] Pager [phone] (please enter 7 digits) PCCM Consult Pager [phone] (please enter 7 digits)

## 2018-02-25 NOTE — H&P (Addendum)
[place] [facility] [place] NAME: Belinda Jenkins    MR#:  [id number]  DATE OF BIRTH:  06/03/1950  DATE OF ADMISSION:  02/25/2018  PRIMARY CARE PHYSICIAN: Adin Hector, MD   REQUESTING/REFERRING PHYSICIAN: Dr. Burlene Arnt  CHIEF COMPLAINT:   Chief Complaint  Patient presents with  . Weakness  . Shortness of Breath   Shortness of breath and generalized weakness for 2 days. HISTORY OF PRESENT ILLNESS:  Belinda Jenkins  is a 67 y.o. female with a known history of COPD, chronic respiratory failure on home oxygen 3 L, hypertension, diabetes, recent pneumonia.  The patient was discharged after treatment of pneumonia 10 days ago.  She is supposed to get trilogy and home health.  But according to her husband, family refused home health.  The patient has had a shortness of breath and generalized weakness for the past 2 days.  She denies any fever or chills, no orthopnea, nocturnal dyspnea or leg edema.  She was found in respiratory distress and hypercapnia, put on BiPAP in the ED.  Chest x-ray did not show any infiltrate or pulmonary edema.  ED physician request admission. PAST MEDICAL HISTORY:   Past Medical History:  Diagnosis Date  . COPD (chronic obstructive pulmonary disease) ([place])   . Diabetes mellitus without complication ([place])   . Family history of adverse reaction to anesthesia    sister - slow to wake after 1 procedure  . Hypertension   . Vertigo     PAST SURGICAL HISTORY:   Past Surgical History:  Procedure Laterality Date  . ABDOMINAL HYSTERECTOMY    . BACK SURGERY     L5  . BLEPHAROPLASTY Bilateral   . BREAST SURGERY     lumpectomy(x1), milk duct(x1)  . CATARACT EXTRACTION W/ INTRAOCULAR LENS  IMPLANT, BILATERAL    . COLONOSCOPY WITH PROPOFOL N/A 07/14/2016   Procedure: COLONOSCOPY WITH PROPOFOL;  Surgeon: Lucilla Lame, MD;  Location: [place];  Service: Endoscopy;  Laterality: N/A;  Diabetic - oral meds requests arrival  around 8 AM  . HIP SURGERY Left   . POLYPECTOMY  07/14/2016   Procedure: POLYPECTOMY;  Surgeon: Lucilla Lame, MD;  Location: [place];  Service: Endoscopy;;    SOCIAL HISTORY:   Social History   Tobacco Use  . Smoking status: Former Smoker    Last attempt to quit: 04/15/2003    Years since quitting: 14.8  . Smokeless tobacco: Never Used  Substance Use Topics  . Alcohol use: Yes    Alcohol/week: 1.0 standard drinks    Types: 1 Shots of liquor per week    FAMILY HISTORY:  History reviewed. No pertinent family history.  DRUG ALLERGIES:   Allergies  Allergen Reactions  . Sulfamethoxazole-Trimethoprim Other (See Comments)    Sulfa causes tingling  . Morphine     Other reaction(s): Other (See Comments) Morphine causes mind altering.  . Oxycodone-Acetaminophen     Other reaction(s): Other (See Comments) Percocet causes mind altering.  . Sertraline Other (See Comments)    Sertraline causes worsening depression.  . Adhesive [Tape] Rash    Some bandaids cause rash    REVIEW OF SYSTEMS:   Review of Systems  Constitutional: Positive for malaise/fatigue. Negative for chills and fever.  HENT: Negative for sore throat.   Eyes: Negative for blurred vision and double vision.  Respiratory: Positive for cough and shortness of breath. Negative for hemoptysis, sputum production, wheezing and stridor.   Cardiovascular: Negative for  chest pain, palpitations, orthopnea and leg swelling.  Gastrointestinal: Negative for abdominal pain, blood in stool, diarrhea, melena, nausea and vomiting.  Genitourinary: Negative for dysuria, flank pain and hematuria.  Musculoskeletal: Negative for back pain and joint pain.  Neurological: Negative for dizziness, sensory change, focal weakness, seizures, loss of consciousness, weakness and headaches.  Endo/Heme/Allergies: Negative for polydipsia.  Psychiatric/Behavioral: Negative for depression. The patient is not nervous/anxious.      MEDICATIONS AT HOME:   Prior to Admission medications   Medication Sig Start Date End Date Taking? Authorizing Provider  alendronate (FOSAMAX) 70 MG tablet Take 70 mg by mouth once a week.    Yes [provider]  ALPRAZolam (XANAX) 0.25 MG tablet Take 0.5 mg by mouth 2 (two) times daily.    Yes [provider]  budesonide (PULMICORT) 0.5 MG/2ML nebulizer solution Take 2 mLs (0.5 mg total) by nebulization 2 (two) times daily. 02/12/18  Yes Max Sane, MD  Cholecalciferol (VITAMIN D3) 2000 units capsule Take 2,000 Units by mouth daily.   Yes [provider]  ipratropium-albuterol (DUONEB) 0.5-2.5 (3) MG/3ML SOLN Inhale 3 mLs into the lungs 4 (four) times daily. AND Q 3 hrs prn (wheezing/sob) 02/12/18  Yes Max Sane, MD  metFORMIN (GLUCOPHAGE) 1000 MG tablet Take 1,000 mg by mouth 2 (two) times daily.    Yes [provider]  metoprolol tartrate (LOPRESSOR) 25 MG tablet Take 12.5 mg by mouth 2 (two) times daily. 02/17/18  Yes [provider]  valsartan (DIOVAN) 160 MG tablet Take 160 mg by mouth daily.   Yes [provider]  vitamin B-12 (CYANOCOBALAMIN) 1000 MCG tablet Take 1,000 mcg by mouth daily.   Yes [provider]  predniSONE (STERAPRED UNI-PAK 21 TAB) 10 MG (21) TBPK tablet Start 60 mg once daily and taper 10 mg daily until done Patient not taking: Reported on 02/25/2018 02/12/18   Max Sane, MD      VITAL SIGNS:  Blood pressure (!) 141/64, pulse 100, resp. rate (!) 26, height 5\' 1"  (1.549 m), weight 66 kg, SpO2 98 %.  PHYSICAL EXAMINATION:  Physical Exam  GENERAL:  67 y.o.-year-old patient lying in the bed with no acute distress.  EYES: Pupils equal, round, reactive to light and accommodation. No scleral icterus. Extraocular muscles intact.  HEENT: Head atraumatic, normocephalic. Oropharynx and nasopharynx clear.  NECK:  Supple, no jugular venous distention. No thyroid enlargement, no tenderness.  LUNGS: Very  diminished breath sounds bilaterally, no wheezing, rales,rhonchi or crepitation. No use of accessory muscles of respiration.  CARDIOVASCULAR: S1, S2 normal. No murmurs, rubs, or gallops.  ABDOMEN: Soft, nontender, nondistended. Bowel sounds present. No organomegaly or mass.  EXTREMITIES: No pedal edema, cyanosis, or clubbing.  NEUROLOGIC: Cranial nerves II through XII are intact. Muscle strength 4/5 in all extremities. Sensation intact. Gait not checked.  PSYCHIATRIC: The patient is alert and oriented x 3.  SKIN: No obvious rash, lesion, or ulcer.   LABORATORY PANEL:   CBC Recent Labs  Lab 02/25/18 1259  WBC 11.0*  HGB 11.3*  HCT 39.3  PLT 316   ------------------------------------------------------------------------------------------------------------------  Chemistries  Recent Labs  Lab 02/25/18 1259  NA 140  K 4.6  CL 89*  CO2 41*  GLUCOSE 229*  BUN 20  CREATININE 0.49  CALCIUM 9.1  AST 17  ALT 14  ALKPHOS 52  BILITOT 0.7   ------------------------------------------------------------------------------------------------------------------  Cardiac Enzymes Recent Labs  Lab 02/25/18 1259  TROPONINI <0.03   ------------------------------------------------------------------------------------------------------------------  RADIOLOGY:  Dg Chest Port 1  View  Result Date: 02/25/2018 CLINICAL DATA:  Shortness of breath EXAM: PORTABLE CHEST 1 VIEW COMPARISON:  02/10/2018 FINDINGS: Mild hyperinflation. Heart is borderline in size. No confluent airspace opacities, effusions or edema. No acute bony abnormality. IMPRESSION: Mild hyperinflation.  No active disease. Electronically Signed   By: Rolm Baptise M.D.   On: 02/25/2018 13:20      IMPRESSION AND PLAN:   Acute on chronic respiratory failure with hypoxia and hypercapnia due to COPD exacerbation. Patient will be admitted to stepdown unit. Continue BiPAP, IV Solu-Medrol every 6 hours, DuoNeb every 6 hours, continue  Pulmicort and intensivist consult.  Accelerated hypertension.  Continue home medication, IV hydralazine PRN. Diabetes.  Hold metformin, start sliding scale.  History of sleep apnea and hyperlipidemia.  I discussed with ICU nurse practitioner Hinton Dyer. All the records are reviewed and case discussed with ED provider. Management plans discussed with the patient, her husband and daughter and they are in agreement.  CODE STATUS: DNR.  TOTAL TIME TAKING CARE OF THIS PATIENT: 40 minutes.    Demetrios Loll M.D on 02/25/2018 at 2:01 PM  Between 7am to 6pm - Pager - [phone]  After 6pm go to [email or web address] - password [email or web address] [facility]  [employer] [facility]  Office  [phone]  CC: Primary care physician; Adin Hector, MD   Note: This dictation was prepared with Dragon dictation along with smaller phrase technology. Any transcriptional errors that result from this process are unin

## 2018-02-26 LAB — CBC
HCT: 35.7 % — ABNORMAL LOW (ref 36.0–46.0)
Hemoglobin: 10.4 g/dL — ABNORMAL LOW (ref 12.0–15.0)
MCH: 26.9 pg (ref 26.0–34.0)
MCHC: 29.1 g/dL — ABNORMAL LOW (ref 30.0–36.0)
MCV: 92.5 fL (ref 80.0–100.0)
NRBC: 0 % (ref 0.0–0.2)
PLATELETS: 301 10*3/uL (ref 150–400)
RBC: 3.86 MIL/uL — AB (ref 3.87–5.11)
RDW: 13.1 % (ref 11.5–15.5)
WBC: 4.9 10*3/uL (ref 4.0–10.5)

## 2018-02-26 LAB — TROPONIN I: Troponin I: <0.03 ng/mL (ref <0.03)

## 2018-02-26 LAB — BLOOD GAS, ARTERIAL
Acid-Base Excess: 19.1 mmol/L — ABNORMAL HIGH (ref 0.0–2.0)
Acid-Base Excess: 22.8 mmol/L — ABNORMAL HIGH (ref 0.0–2.0)
BICARBONATE: 51.1 mmol/L — AB (ref 20.0–28.0)
Bicarbonate: 49.7 mmol/L — ABNORMAL HIGH (ref 20.0–28.0)
FIO2: 0.36
FIO2: 28
O2 SAT: 98.6 %
O2 Saturation: 99.5 %
PATIENT TEMPERATURE: 37
PCO2 ART: 90 mmHg — AB (ref 32.0–48.0)
Patient temperature: 37
pCO2 arterial: 77 mmHg (ref 32.0–48.0)
pH, Arterial: 7.35 (ref 7.350–7.450)
pH, Arterial: 7.43 (ref 7.350–7.450)
pO2, Arterial: 124 mmHg — ABNORMAL HIGH (ref 83.0–108.0)
pO2, Arterial: 159 mmHg — ABNORMAL HIGH (ref 83.0–108.0)

## 2018-02-26 LAB — BASIC METABOLIC PANEL
ANION GAP: 13 (ref 5–15)
BUN: 20 mg/dL (ref 8–23)
CALCIUM: 8.9 mg/dL (ref 8.9–10.3)
CO2: 41 mmol/L — ABNORMAL HIGH (ref 22–32)
CREATININE: 0.43 mg/dL — AB (ref 0.44–1.00)
Chloride: 86 mmol/L — ABNORMAL LOW (ref 98–111)
Glucose, Bld: 174 mg/dL — ABNORMAL HIGH (ref 70–99)
Potassium: 4 mmol/L (ref 3.5–5.1)
SODIUM: 140 mmol/L (ref 135–145)

## 2018-02-26 LAB — GLUCOSE, CAPILLARY
GLUCOSE-CAPILLARY: 225 mg/dL — AB (ref 70–99)
Glucose-Capillary: 179 mg/dL — ABNORMAL HIGH (ref 70–99)
Glucose-Capillary: 222 mg/dL — ABNORMAL HIGH (ref 70–99)
Glucose-Capillary: 243 mg/dL — ABNORMAL HIGH (ref 70–99)

## 2018-02-26 MED ORDER — INSULIN GLARGINE 100 UNIT/ML ~~LOC~~ SOLN
6.0000 [IU] | Freq: Every day | SUBCUTANEOUS | Status: DC
  Administered 2018-02-26 – 2018-03-03 (×6): 6 [IU] via SUBCUTANEOUS
  Filled 2018-02-26 (×6): qty 0.06

## 2018-02-26 MED ORDER — MEDROXYPROGESTERONE ACETATE 10 MG PO TABS
10.0000 mg | ORAL_TABLET | Freq: Every day | ORAL | Status: AC
  Administered 2018-02-27 – 2018-02-28 (×2): 10 mg via ORAL
  Filled 2018-02-26 (×4): qty 1

## 2018-02-26 MED ORDER — METHYLPREDNISOLONE SODIUM SUCC 40 MG IJ SOLR
40.0000 mg | Freq: Two times a day (BID) | INTRAMUSCULAR | Status: DC
  Administered 2018-02-27 – 2018-02-28 (×4): 40 mg via INTRAVENOUS
  Filled 2018-02-26 (×4): qty 1

## 2018-02-26 MED ORDER — INSULIN ASPART 100 UNIT/ML ~~LOC~~ SOLN: 0.0000 [IU] | Freq: Every day | SUBCUTANEOUS | Status: DC

## 2018-02-26 MED ORDER — INSULIN ASPART 100 UNIT/ML ~~LOC~~ SOLN
0.0000 [IU] | Freq: Three times a day (TID) | SUBCUTANEOUS | Status: DC
  Administered 2018-02-26 – 2018-02-27 (×2): 5 [IU] via SUBCUTANEOUS
  Administered 2018-02-27: 3 [IU] via SUBCUTANEOUS
  Administered 2018-02-27: 5 [IU] via SUBCUTANEOUS
  Administered 2018-02-28 (×2): 3 [IU] via SUBCUTANEOUS
  Administered 2018-02-28 – 2018-03-01 (×2): 5 [IU] via SUBCUTANEOUS
  Administered 2018-03-01: 2 [IU] via SUBCUTANEOUS
  Administered 2018-03-01: 3 [IU] via SUBCUTANEOUS
  Administered 2018-03-02: 2 [IU] via SUBCUTANEOUS
  Administered 2018-03-02: 8 [IU] via SUBCUTANEOUS
  Administered 2018-03-02: 3 [IU] via SUBCUTANEOUS
  Administered 2018-03-03: 8 [IU] via SUBCUTANEOUS
  Filled 2018-02-26 (×13): qty 1

## 2018-02-26 NOTE — Plan of Care (Signed)

## 2018-02-26 NOTE — Progress Notes (Addendum)
Name: Belinda Jenkins MRN: [id number] DOB: 05/30/1950    ADMISSION DATE:  02/25/2018 CONSULTATION DATE: 02/25/2018  REFERRING MD : Dr. Bridgett Larsson  CHIEF COMPLAINT: Weakness   BRIEF PATIENT DESCRIPTION:  67 yo female admitted with acute encephalopathy and acute on chronic hypercapnic hypoxic respiratory failure secondary to AECOPD requiring Bipap   SIGNIFICANT EVENTS  11/14-Pt admitted to the stepdown unit on Bipap  02/26/2018: Off BiPAP doing well PCO2 still in the 90 range  HISTORY OF PRESENT ILLNESS:   This is a 67 yo female with a PMH of Vertigo, HTN, Type II Diabetes Mellitus, and COPD.  She presented to [facility] ER on 11/14 with worsening weakness and shortness of breath. She was recently discharge from [facility] on 02/12/18 following treatment of pneumonia.  Per ER notes the pts husband stated the morning of 11/14 the pt was difficult to arouse.  Pts daughter states she does not think she has been receiving her scheduled duoneb treatments.  Upon arrival to the ER the pt was lethargic and hypoxic with O2 sats 60% on 2L via nasal canula, therefore she was placed on NRB. She remained tachypneic vbg revealed pH 7.26/pCO106, therefore she was transitioned to Bipap.  She received 125 mg iv solumedrol and duoneb x1. CXR negative, troponin <0.03, lactic acid 1.20, wbc 11.0, and UA negative for UTI.  EKG revealed sinus tachycardia, hr 104, RBBB, and mild ST elevation similar to previous EKG's. She was subsequently admitted to the stepdown unit for additional workup and treatment.  02/26/2018: Patient is feeling fine, denies any significant complaint no coughing wheezing or chest pain blood sugar is mildly elevated to 222 - Potassium is 4.0 creatinine improved 0.43 - Patient is about 900 cc negative -Apparently patient was refusing to use BiPAP at night   VITAL SIGNS: Temp:  [97.5 F (36.4 C)-98 F (36.7 C)] 97.9 F (36.6 C) (11/15 0800) Pulse Rate:  [82-107] 90 (11/15 1200) Resp:  [9-35] 17  (11/15 1200) BP: (83-182)/(64-89) 140/80 (11/15 1200) SpO2:  [92 %-100 %] 97 % (11/15 1200) FiO2 (%):  [30 %-35 %] 35 % (11/14 2108) Weight:  [60.8 kg-66 kg] 60.8 kg (11/14 1500)  PHYSICAL EXAMINATION: General: well developed, well nourished female, NAD on Bipap  Neuro: alert and oriented, follows commands, PERRL  HEENT: supple, no JVD  Cardiovascular: nsr, rrr, no R/G Lungs: diminished throughout, even, non labored  Abdomen: +BS x4, obese, soft, non tender, non distended  Musculoskeletal: normal bulk and tone, no edema  Skin: intact no rashes or lesions present   Recent Labs  Lab 02/25/18 1259 02/26/18 0154  NA 140 140  K 4.6 4.0  CL 89* 86*  CO2 41* 41*  BUN 20 20  CREATININE 0.49 0.43*  GLUCOSE 229* 174*   Recent Labs  Lab 02/25/18 1259 02/26/18 0154  HGB 11.3* 10.4*  HCT 39.3 35.7*  WBC 11.0* 4.9  PLT 316 301   Dg Chest Port 1 View  Result Date: 02/25/2018 CLINICAL DATA:  Shortness of breath EXAM: PORTABLE CHEST 1 VIEW COMPARISON:  02/10/2018 FINDINGS: Mild hyperinflation. Heart is borderline in size. No confluent airspace opacities, effusions or edema. No acute bony abnormality. IMPRESSION: Mild hyperinflation.  No active disease. Electronically Signed   By: Rolm Baptise M.D.   On: 02/25/2018 13:20    ASSESSMENT / PLAN:  Acute on chronic hypoxic hypercapnic respiratory failure secondary to AECOPD Doing well on 2 to 3 L oxygen -Will benefit from nocturnal BiPAP Scheduled and prn bronchodilator therapy -Decrease steroids  to 40 every 12 Repeat ABG Periodic chest x-ray improvement with Lasix and steroids noted -We will give a dose of Diamox - We will start the patient on Provera for 3 days to adjust the pH and PCO2 -Plan of care discussed with the patient and family Will likely need nocturnal Bipap due to chronic hypercapnia which will reduce frequent hospitalization - Discussed with patient at length that BiPAP is important for her otherwise she will get  intubated, patient is already DNR and does not wish to be intubated so BiPAP is only way to keep her PCO2 down and support her ventilation especially at nighttime patient is agreeable to use it at nighttime.  Hypertension Continuous telemetry monitoring  Continue outpatient avapro and metoprolol Prn hydralazine for bp management  Type II Diabetes Mellitus CBG's ac/hs-as blood sugars are remaining elevated and we are not starting the Glucophage packable start the patient on Lantus 5 units and can be resumed on Glucophage before discharge SSI   VTE px: subq lovenox  And PT consultation called -No central line no Foley -We will repeat ABG to make sure she is doing well, as long as pH is normalized PCO2 of 70 or higher can be her baseline, patient can be transferred if ABG is okay out of ICU with close follow-up with pulmonary  Jaeli Gr[facility] Pulmonary Critical Care & Sleep Medicine

## 2018-02-26 NOTE — Progress Notes (Signed)
Report called to 2A RN and patient left unit at this time. All questions answered and instructed to call if they had any further questions.

## 2018-02-26 NOTE — Progress Notes (Signed)
-  Patient's ABG was repeated in the morning -Showed PCO2 improved to 77 -pH is 7.43 on currently 3 L oxygen - PO2 still elevated to 126 advised nursing to cut back on FiO2 to 1 L oxygen -Overall patient seems like back to baseline - Patient can be transferred out of ICU with BiPAP at nighttime -Plan of care discussed with the nursing

## 2018-02-26 NOTE — Progress Notes (Addendum)
[place] at [place] NAME: Camiyah Friberg    MR#:  [id number]  DATE OF BIRTH:  1951/03/25  SUBJECTIVE:   Shortness of breath has greatly improved.  She denies any fevers or chills.  Off BiPAP and now stable on 2L nasal cannula  REVIEW OF SYSTEMS:  Review of Systems  Constitutional: Negative for chills and fever.  HENT: Negative for congestion and sore throat.   Eyes: Negative for blurred vision and double vision.  Respiratory: Positive for shortness of breath. Negative for cough.   Cardiovascular: Negative for chest pain, palpitations and leg swelling.  Gastrointestinal: Negative for abdominal pain, nausea and vomiting.  Genitourinary: Negative for dysuria, frequency and urgency.  Musculoskeletal: Negative for back pain and neck pain.  Neurological: Negative for dizziness and headaches.  Psychiatric/Behavioral: Negative for depression. The patient is not nervous/anxious.     DRUG ALLERGIES:   Allergies  Allergen Reactions  . Sulfamethoxazole-Trimethoprim Other (See Comments)    Sulfa causes tingling  . Morphine     Other reaction(s): Other (See Comments) Morphine causes mind altering.  . Oxycodone-Acetaminophen     Other reaction(s): Other (See Comments) Percocet causes mind altering.  . Sertraline Other (See Comments)    Sertraline causes worsening depression.  . Adhesive [Tape] Rash    Some bandaids cause rash   VITALS:  Blood pressure 136/75, pulse 96, temperature 98.8 F (37.1 C), temperature source Oral, resp. rate (!) 26, height 5\' 2"  (1.575 m), weight 59.7 kg, SpO2 93 %. PHYSICAL EXAMINATION:  Physical Exam  GENERAL:  67 y.o.-year-old patient lying in the bed with no acute distress.  EYES: Pupils equal, round, reactive to light and accommodation. No scleral icterus. Extraocular muscles intact.  HEENT: Head atraumatic, normocephalic. Oropharynx and nasopharynx clear. Moist mucous membranes. NECK:  Supple, no jugular  venous distention. No thyroid enlargement, no tenderness.  LUNGS: +Diminished breath sounds bilaterally, no wheezing, rales,rhonchi or crepitation. No use of accessory muscles of respiration. [place] in place. CARDIOVASCULAR: RRR, S1, S2 normal. No murmurs, rubs, or gallops.  ABDOMEN: Soft, nontender, nondistended. Bowel sounds present. No organomegaly or mass.  EXTREMITIES: No pedal edema, cyanosis, or clubbing.  NEUROLOGIC: Cranial nerves II through XII are intact. +global weakness. Sensation intact. Gait not checked.  PSYCHIATRIC: The patient is alert and oriented x 3.  SKIN: No obvious rash, lesion, or ulcer.  LABORATORY PANEL:  Female CBC Recent Labs  Lab 02/26/18 0154  WBC 4.9  HGB 10.4*  HCT 35.7*  PLT 301   ------------------------------------------------------------------------------------------------------------------ Chemistries  Recent Labs  Lab 02/25/18 1258  02/25/18 1259 02/26/18 0154  NA  --    < > 140 140  K  --    < > 4.6 4.0  CL  --    < > 89* 86*  CO2  --    < > 41* 41*  GLUCOSE  --    < > 229* 174*  BUN  --    < > 20 20  CREATININE  --    < > 0.49 0.43*  CALCIUM  --    < > 9.1 8.9  MG 1.7  --   --   --   AST  --   --  17  --   ALT  --   --  14  --   ALKPHOS  --   --  52  --   BILITOT  --   --  0.7  --    < > =  values in this interval not displayed.   RADIOLOGY:  No results found. ASSESSMENT AND PLAN:   Acute on chronic respiratory failure with hypoxia and hypercapnia due to COPD exacerbation- improved, now off BiPAP -Continue IV solumedrol for today -Duonebs as needed -Continue pulmicort -Wean O2 as able -Procalcitonin negative, so will not start antibiotics  Hypertension- BPs well-controlled -Continue home meds -IV hydralazine prn  Type 2 diabetes- blood sugars elevated with steroids -Change SSI to from sensitive to moderate  OSA- stable -BiPAP qhs per pulm recs  Normocytic anemia- Hgb at baseline -Check anemia panel with morning  labs  All the records are reviewed and case discussed with Care Management/Social Worker. Management plans discussed with the patient, family and they are in agreement.  CODE STATUS: DNR  TOTAL TIME TAKING CARE OF THIS PATIENT: 40 minutes.   More than 50% of the time was spent in counseling/coordination of care: YES  POSSIBLE D/C IN 1-2 DAYS, DEPENDING ON CLINICAL CONDITION.   Berna Spare Shamyia Grandpre M.D on 02/26/2018 at 3:27 PM  Between 7am to 6pm - Pager - [phone]  After 6pm go to [email or web address] - password [email or web address] [facility]  [facility] [place] [facility]  Office  [phone]  CC: Primary care physician; Adin Hector, MD  Note: This dictation was prepared with Dragon dictation along with smaller phrase technology. Any transcriptional errors that result from this process are unintentional.

## 2018-02-27 LAB — GLUCOSE, CAPILLARY
GLUCOSE-CAPILLARY: 206 mg/dL — AB (ref 70–99)
GLUCOSE-CAPILLARY: 246 mg/dL — AB (ref 70–99)
Glucose-Capillary: 186 mg/dL — ABNORMAL HIGH (ref 70–99)
Glucose-Capillary: 271 mg/dL — ABNORMAL HIGH (ref 70–99)

## 2018-02-27 LAB — CBC
HEMATOCRIT: 36.9 % (ref 36.0–46.0)
HEMOGLOBIN: 11.1 g/dL — AB (ref 12.0–15.0)
MCH: 27.1 pg (ref 26.0–34.0)
MCHC: 30.1 g/dL (ref 30.0–36.0)
MCV: 90.2 fL (ref 80.0–100.0)
Platelets: 318 10*3/uL (ref 150–400)
RBC: 4.09 MIL/uL (ref 3.87–5.11)
RDW: 13.3 % (ref 11.5–15.5)
WBC: 8.8 10*3/uL (ref 4.0–10.5)
nRBC: 0 % (ref 0.0–0.2)

## 2018-02-27 LAB — IRON AND TIBC
Iron: 54 ug/dL (ref 28–170)
SATURATION RATIOS: 14 % (ref 10.4–31.8)
TIBC: 394 ug/dL (ref 250–450)
UIBC: 340 ug/dL

## 2018-02-27 LAB — BASIC METABOLIC PANEL
ANION GAP: 9 (ref 5–15)
BUN: 29 mg/dL — ABNORMAL HIGH (ref 8–23)
CHLORIDE: 89 mmol/L — AB (ref 98–111)
CO2: 39 mmol/L — ABNORMAL HIGH (ref 22–32)
Calcium: 9.5 mg/dL (ref 8.9–10.3)
Creatinine, Ser: 0.48 mg/dL (ref 0.44–1.00)
GFR calc Af Amer: >60 mL/min (ref >60)
Glucose, Bld: 209 mg/dL — ABNORMAL HIGH (ref 70–99)
POTASSIUM: 4.1 mmol/L (ref 3.5–5.1)
Sodium: 137 mmol/L (ref 135–145)

## 2018-02-27 LAB — FOLATE: Folate: 5.8 ng/mL — ABNORMAL LOW (ref >5.9)

## 2018-02-27 LAB — FERRITIN: FERRITIN: 15 ng/mL (ref 11–307)

## 2018-02-27 LAB — VITAMIN B12: Vitamin B-12: 310 pg/mL (ref 180–914)

## 2018-02-27 LAB — HIV ANTIBODY (ROUTINE TESTING W REFLEX): HIV SCREEN 4TH GENERATION: NONREACTIVE

## 2018-02-27 MED ORDER — IPRATROPIUM-ALBUTEROL 0.5-2.5 (3) MG/3ML IN SOLN
3.0000 mL | Freq: Three times a day (TID) | RESPIRATORY_TRACT | Status: DC
  Administered 2018-02-27 – 2018-03-03 (×12): 3 mL via RESPIRATORY_TRACT
  Filled 2018-02-27 (×13): qty 3

## 2018-02-27 NOTE — Evaluation (Signed)
Occupational Therapy Evaluation Patient Details Name: Belinda Jenkins MRN: [id number] DOB: 1950/04/23 Today's Date: 02/27/2018    History of Present Illness Pt presented to ER secondary to progressive SOB, generalized weakness; admitted with acute on chronic respiratory failure with hypoxia, hypercapnea due to COPD exacerbation.  initiall requiring BiPAP; now weaned to 2L O2.   Clinical Impression   Pt in bed visiting with several family members , willing to work with OT - on 2L of O2 , and not in distress any anytime during session - do admit that she can anxious at times and increase SOB - discuss enegy conservation principles and pt and family very interested - and open for recommendations. Did discuss LB dressing and using AE - pt familiar with with it when she had knee surgery but did not use it , Patient alert and oriented; follows commands, fair insight into deficits and safety needs.  Generally weak and deconditioned and slow in movements but did not show any LOB and able to use AE and scoot up in bed without railing.  Strength grossly same bilateral and WFL for basic ADL's and functional mobility to bathroom.   Pt could benefit from [facility] - appear with last discharge from hospital they declined [facility]. Recommend transition to [place] and OT  upon discharge from acute hospitalization    Follow Up Recommendations  Home health OT    Equipment Recommendations    RW - she borrowed hers to sister    Recommendations for Other Services PT consult     Precautions / Restrictions Precautions Precautions: Fall Restrictions Weight Bearing Restrictions: No      Mobility Bed Mobility Overal bed mobility: Needs Assistance Bed Mobility: Supine to Sit(MIN )     Supine to sit: Supervision;Independent Sit to supine: Supervision;HOB elevated;Independent   General bed mobility comments: incrased time, use of bedrails required  Transfers Overall transfer level: (supervision) Equipment used:  Rolling walker (2 wheeled) Transfers: Sit to/from Stand Sit to Stand: Supervision         General transfer comment: NO LOB to bathroom     Balance Overall balance assessment: Needs assistance Sitting-balance support: Feet supported Sitting balance-Leahy Scale: Good(participate in LB dressing - no support)     Standing balance support: Bilateral upper extremity supported Standing balance-Leahy Scale: Good(able to hold clothing during toiletting)                             ADL either performed or assessed with clinical judgement   ADL Overall ADL's : Independent                                       General ADL Comments: But get SOB with LB ADL's - was ed on using AE - and was interested; and like to get bathroom very humid pt daughters      Vision Baseline Vision/History: Wears glasses       Perception     Praxis      Pertinent Vitals/Pain Pain Assessment: No/denies pain     Hand Dominance     Extremity/Trunk Assessment Upper Extremity Assessment Upper Extremity Assessment: Generalized weakness       Cervical / Trunk Assessment Cervical / Trunk Assessment: Normal   Communication Communication Communication: No difficulties   Cognition Arousal/Alertness: Awake/alert Behavior During Therapy: WFL for tasks assessed/performed Overall Cognitive Status: Within Functional  Limits for tasks assessed                                     General Comments       Exercises Other Exercises Other Exercises: Bilateral UE WFL for ROM and strength , Other Exercises: functional mobility to bathroom S using RW - able to open door -and clothing management was S - holding clothing and perfrorming hygiene   Shoulder Instructions      Home Living Family/patient expects to be discharged to:: Private residence Living Arrangements: Spouse/significant other;Children Available Help at Discharge: Family;Available 24 hours/day Type of  Home: House Home Access: Stairs to enter [employer] of Steps: 4-5 Entrance Stairs-Rails: None Home Layout: One level     Bathroom Shower/Tub: Walk-in shower(hand held shower with bench)   Bathroom Toilet: Handicapped height     Home Equipment: Environmental consultant - 2 wheels;Cane - single point          Prior Functioning/Environment Level of Independence: Independent with assistive device(s)        Comments: Occasional use of SPC when not feeling well.        OT Problem List: Decreased knowledge of use of DME or AE;Cardiopulmonary status limiting activity      OT Treatment/Interventions:      OT Goals(Current goals can be found in the care plan section) Acute Rehab OT Goals Patient Stated Goal: to go home  OT Goal Formulation: With patient Time For Goal Achievement: 03/06/18 Potential to Achieve Goals: Good  OT Frequency:     Barriers to D/C:            Co-evaluation              AM-PAC PT "6 Clicks" Daily Activity     Outcome Measure Help from another person eating meals?: A Little Help from another person taking care of personal grooming?: None Help from another person toileting, which includes using toliet, bedpan, or urinal?: A Little Help from another person bathing (including washing, rinsing, drying)?: A Little Help from another person to put on and taking off regular upper body clothing?: A Little Help from another person to put on and taking off regular lower body clothing?: A Lot 6 Click Score: 18   End of Session Equipment Utilized During Treatment: Gait belt Nurse Communication: Mobility status  Activity Tolerance: Patient tolerated treatment well Patient left: in bed;with family/visitor present  OT Visit Diagnosis: Muscle weakness (generalized) (M62.81)                Time: 0932-6712 OT Time Calculation (min): 45 min Charges:  OT Evaluation $OT Eval Low Complexity: 1 Low    Chelcea Zahn OTR/L,CLT 02/27/2018, 4:25 PM

## 2018-02-27 NOTE — Progress Notes (Signed)
[place] at [place] NAME: Eiman Maret    MR#:  [id number]  DATE OF BIRTH:  05/10/1950  SUBJECTIVE:   Shortness of breath improved. feeling very weak and reporting exertional dyspnea but clinically she is improving she denies any fevers or chills.  Off BiPAP and now stable on 2L home oxygen nasal cannula  REVIEW OF SYSTEMS:  Review of Systems  Constitutional: Negative for chills and fever.  HENT: Negative for congestion and sore throat.   Eyes: Negative for blurred vision and double vision.  Respiratory: Positive for shortness of breath. Negative for cough.   Cardiovascular: Negative for chest pain, palpitations and leg swelling.  Gastrointestinal: Negative for abdominal pain, nausea and vomiting.  Genitourinary: Negative for dysuria, frequency and urgency.  Musculoskeletal: Negative for back pain and neck pain.  Neurological: Negative for dizziness and headaches.  Psychiatric/Behavioral: Negative for depression. The patient is not nervous/anxious.     DRUG ALLERGIES:   Allergies  Allergen Reactions  . Sulfamethoxazole-Trimethoprim Other (See Comments)    Sulfa causes tingling  . Morphine     Other reaction(s): Other (See Comments) Morphine causes mind altering.  . Oxycodone-Acetaminophen     Other reaction(s): Other (See Comments) Percocet causes mind altering.  . Sertraline Other (See Comments)    Sertraline causes worsening depression.  . Adhesive [Tape] Rash    Some bandaids cause rash   VITALS:  Blood pressure (!) 144/78, pulse 98, temperature 98.2 F (36.8 C), temperature source Oral, resp. rate 18, height 5\' 2"  (1.575 m), weight 59.7 kg, SpO2 97 %. PHYSICAL EXAMINATION:  Physical Exam  GENERAL:  67 y.o.-year-old patient lying in the bed with no acute distress.  EYES: Pupils equal, round, reactive to light and accommodation. No scleral icterus. Extraocular muscles intact.  HEENT: Head atraumatic, normocephalic.  Oropharynx and nasopharynx clear. Moist mucous membranes. NECK:  Supple, no jugular venous distention. No thyroid enlargement, no tenderness.  LUNGS: +Diminished breath sounds bilaterally, no wheezing, rales,rhonchi or crepitation. No use of accessory muscles of respiration. [place] in place. CARDIOVASCULAR: RRR, S1, S2 normal. No murmurs, rubs, or gallops.  ABDOMEN: Soft, nontender, nondistended. Bowel sounds present.  EXTREMITIES: No pedal edema, cyanosis, or clubbing.  NEUROLOGIC: Cranial nerves II through XII are intact. +global weakness. Sensation intact. Gait not checked.  PSYCHIATRIC: The patient is alert and oriented x 3.  SKIN: No obvious rash, lesion, or ulcer.  LABORATORY PANEL:  Female CBC Recent Labs  Lab 02/27/18 0438  WBC 8.8  HGB 11.1*  HCT 36.9  PLT 318   ------------------------------------------------------------------------------------------------------------------ Chemistries  Recent Labs  Lab 02/25/18 1258 02/25/18 1259  02/27/18 0438  NA  --  140   < > 137  K  --  4.6   < > 4.1  CL  --  89*   < > 89*  CO2  --  41*   < > 39*  GLUCOSE  --  229*   < > 209*  BUN  --  20   < > 29*  CREATININE  --  0.49   < > 0.48  CALCIUM  --  9.1   < > 9.5  MG 1.7  --   --   --   AST  --  17  --   --   ALT  --  14  --   --   ALKPHOS  --  52  --   --   BILITOT  --  0.7  --   --    < > =  values in this interval not displayed.   RADIOLOGY:  No results found. ASSESSMENT AND PLAN:   Acute on chronic respiratory failure with hypoxia and hypercapnia due to COPD exacerbation- improved, now off BiPAP -Continue IV solumedrol as patient is reporting exertional dyspnea -Respiratory therapy for chest physical therapy -Duonebs as needed -Continue pulmicort -Patient chronically lives on 2 L of oxygen at home will continue the same -Procalcitonin negative, so will not start antibiotics -Patient admits that she quit smoking 14 years ago  Hypertension- BPs well-controlled -Continue  home meds -IV hydralazine prn  Type 2 diabetes- blood sugars elevated with steroids -Change SSI to from sensitive to moderate  OSA- stable -BiPAP qhs per pulm recs  Normocytic anemia- Hgb at baseline -Check anemia panel with morning labs  -Generalized weakness PT consult placed  All the records are reviewed and case discussed with Care Management/Social Worker. Management plans discussed with the patient, family and they are in agreement.  CODE STATUS: Full Code  TOTAL TIME TAKING CARE OF THIS PATIENT: 35  minutes.   More than 50% of the time was spent in counseling/coordination of care: YES  POSSIBLE D/C IN 1-2 DAYS, DEPENDING ON CLINICAL CONDITION.   Nicholes Mango M.D on 02/27/2018 at 12:01 PM  Between 7am to 6pm - Pager - [phone]  After 6pm go to [email or web address] - password [email or web address] [facility]  [facility] [place] [facility]  Office  [phone]  CC: Primary care physician; Adin Hector, MD  Note: This dictation was prepared with Dragon dictation along with smaller phrase technology. Any transcriptional errors that result from this process are unintentional.

## 2018-02-27 NOTE — Evaluation (Signed)
Physical Therapy Evaluation Patient Details Name: Belinda Jenkins MRN: [id number] DOB: June 23, 1950 Today's Date: 02/27/2018   History of Present Illness  presented to ER secondary to progressive SOB, generalized weakness; admitted with acute on chronic respiratory failure with hypoxia, hypercapnea due to COPD exacerbation.  initiall requiring BiPAP; now weaned to 2L O2.  Clinical Impression  Upon evaluation, patient alert and oriented; follows commands, fair insight into deficits and safety needs.  Generally weak and deconditioned, but strength and ROM grossly symmetrical and WFL for basic transfers and gait.  Very slow and guarded in all movement patterns; limited balance reactions evident. Able to complete bed mobility with sup; sit/stand, basic transfers and gait (39') with RW, cga/min assist.  Mod SOB/fatigue; sats >92% on 2L.  Additional distance/activity limited by fatigue. Would benefit from skilled PT to address above deficits and promote optimal return to PLOF; Recommend transition to [place] upon discharge from acute hospitalization.     Follow Up Recommendations Home health PT;Supervision/Assistance - 24 hour    Equipment Recommendations  Rolling walker with 5" wheels    Recommendations for Other Services OT consult     Precautions / Restrictions Precautions Precautions: Fall Restrictions Weight Bearing Restrictions: No      Mobility  Bed Mobility Overal bed mobility: Needs Assistance Bed Mobility: Supine to Sit     Supine to sit: Supervision     General bed mobility comments: incrased time, use of bedrails required  Transfers Overall transfer level: Needs assistance Equipment used: Rolling walker (2 wheeled) Transfers: Sit to/from Stand Sit to Stand: Min guard;Min assist         General transfer comment: cuing for hand placement, still pulls on RW  Ambulation/Gait Ambulation/Gait assistance: Min guard Gait Distance (Feet): 40 Feet Assistive device:  Rolling walker (2 wheeled)       General Gait Details: broad BOS with short, choppy stepping performance; poor balance reactions; stepping very labored and effortful  Stairs            Wheelchair Mobility    Modified Rankin (Stroke Patients Only)       Balance Overall balance assessment: Needs assistance Sitting-balance support: Feet supported Sitting balance-Leahy Scale: Good     Standing balance support: Bilateral upper extremity supported Standing balance-Leahy Scale: Fair                               Pertinent Vitals/Pain Pain Assessment: No/denies pain    Home Living Family/patient expects to be discharged to:: Private residence Living Arrangements: Spouse/significant other;Children Available Help at Discharge: Family;Available 24 hours/day Type of Home: House Home Access: Stairs to enter Entrance Stairs-Rails: None Entrance Stairs-Number of Steps: 4-5 Home Layout: One level        Prior Function Level of Independence: Independent with assistive device(s)         Comments: Occasional use of SPC when not feeling well.     Hand Dominance        Extremity/Trunk Assessment                Communication   Communication: No difficulties  Cognition Arousal/Alertness: Awake/alert Behavior During Therapy: WFL for tasks assessed/performed Overall Cognitive Status: Within Functional Limits for tasks assessed                                        General  Comments      Exercises Other Exercises Other Exercises: Toilet transfer, ambulatory with RW, cga/min assist-very slow and deliberate, limited balance reacitons; sit/stand from standard toilet, min assist; standing balance for hygiene, clothing management, min assist.   Assessment/Plan    PT Assessment    PT Problem List         PT Treatment Interventions      PT Goals (Current goals can be found in the Care Plan section)  Acute Rehab PT Goals Patient  Stated Goal: to try to get up Time For Goal Achievement: 03/13/18 Potential to Achieve Goals: Fair    Frequency Min 2X/week   Barriers to discharge        Co-evaluation               AM-PAC PT "6 Clicks" Daily Activity  Outcome Measure Difficulty turning over in bed (including adjusting bedclothes, sheets and blankets)?: A Little Difficulty moving from lying on back to sitting on the side of the bed? : A Little Difficulty sitting down on and standing up from a chair with arms (e.g., wheelchair, bedside commode, etc,.)?: Unable Help needed moving to and from a bed to chair (including a wheelchair)?: A Little Help needed walking in hospital room?: A Little Help needed climbing 3-5 steps with a railing? : A Lot 6 Click Score: 15    End of Session Equipment Utilized During Treatment: Gait belt;Oxygen Activity Tolerance: Patient tolerated treatment well Patient left: in chair;with call bell/phone within reach;with chair alarm set;with family/visitor present Nurse Communication: Mobility status PT Visit Diagnosis: Other abnormalities of gait and mobility (R26.89);Muscle weakness (generalized) (M62.81);Difficulty in walking, not elsewhere classified (R26.2)    Time: 9480-1655 PT Time Calculation (min) (ACUTE ONLY): 28 min   Charges:   PT Evaluation $PT Eval Low Complexity: 1 Low PT Treatments $Therapeutic Activity: 8-22 mins        Tlaloc Taddei H. Owens Shark, PT, DPT, NCS 02/27/18, 1:38 PM [phone]

## 2018-02-28 ENCOUNTER — Encounter: Payer: Self-pay | Admitting: *Deleted

## 2018-02-28 LAB — GLUCOSE, CAPILLARY
GLUCOSE-CAPILLARY: 182 mg/dL — AB (ref 70–99)
GLUCOSE-CAPILLARY: 281 mg/dL — AB (ref 70–99)
Glucose-Capillary: 243 mg/dL — ABNORMAL HIGH (ref 70–99)
Glucose-Capillary: 191 mg/dL — ABNORMAL HIGH (ref 70–99)

## 2018-02-28 MED ORDER — PREDNISONE 50 MG PO TABS
50.0000 mg | ORAL_TABLET | Freq: Every day | ORAL | Status: DC
  Administered 2018-03-01 – 2018-03-03 (×3): 50 mg via ORAL
  Filled 2018-02-28 (×3): qty 1

## 2018-02-28 NOTE — Care Management (Addendum)
Patient continue to exhibit signs of hypercapnia associated with chronic respiratory failure secondary to severe COPD. Patient requires the use of NIV both QHS and daytime to help with exacerbation periods. The use of the NIV will treat patients high CO2 levels and can reduce the risks of exacerbations and future hospitalizations when used at night and during the day. Patient will need these advanced settings in conjunction with current medication regimen; Bipap is not an option due to its functional limitations and the severity of the patient condition. Failure to have an NIV available for use over a 24 hour period could lead to death

## 2018-02-28 NOTE — Progress Notes (Addendum)
[facility] at [place] NAME: Belinda Jenkins    MR#:  [id number]  DATE OF BIRTH:  09-30-1950  SUBJECTIVE:   Shortness of breath improved, out of bed to chair now stable on 2L home oxygen nasal cannula, patient needs noninvasive ventilator at the time of discharge as recommended by intensivist Family at bedside REVIEW OF SYSTEMS:  Review of Systems  Constitutional: Negative for chills and fever.  HENT: Negative for congestion and sore throat.   Eyes: Negative for blurred vision and double vision.  Respiratory: Negative for cough and shortness of breath.   Cardiovascular: Negative for chest pain, palpitations and leg swelling.  Gastrointestinal: Negative for abdominal pain, nausea and vomiting.  Genitourinary: Negative for dysuria, frequency and urgency.  Musculoskeletal: Negative for back pain and neck pain.  Neurological: Negative for dizziness and headaches.  Psychiatric/Behavioral: Negative for depression. The patient is not nervous/anxious.     DRUG ALLERGIES:   Allergies  Allergen Reactions  . Sulfamethoxazole-Trimethoprim Other (See Comments)    Sulfa causes tingling  . Morphine     Other reaction(s): Other (See Comments) Morphine causes mind altering.  . Oxycodone-Acetaminophen     Other reaction(s): Other (See Comments) Percocet causes mind altering.  . Sertraline Other (See Comments)    Sertraline causes worsening depression.  . Adhesive [Tape] Rash    Some bandaids cause rash   VITALS:  Blood pressure (!) 146/83, pulse (!) 103, temperature 98.2 F (36.8 C), temperature source Oral, resp. rate 19, height 5\' 2"  (1.575 m), weight 59.7 kg, SpO2 98 %. PHYSICAL EXAMINATION:  Physical Exam  GENERAL:  67 y.o.-year-old patient lying in the bed with no acute distress.  EYES: Pupils equal, round, reactive to light and accommodation. No scleral icterus. Extraocular muscles intact.  HEENT: Head atraumatic, normocephalic.  Oropharynx and nasopharynx clear. Moist mucous membranes. NECK:  Supple, no jugular venous distention. No thyroid enlargement, no tenderness.  LUNGS: +Diminished breath sounds bilaterally, no wheezing, rales,rhonchi or crepitation. No use of accessory muscles of respiration. [place] in place. CARDIOVASCULAR: RRR, S1, S2 normal. No murmurs, rubs, or gallops.  ABDOMEN: Soft, nontender, nondistended. Bowel sounds present.  EXTREMITIES: No pedal edema, cyanosis, or clubbing.  NEUROLOGIC: Cranial nerves II through XII are intact. +global weakness. Sensation intact. Gait not checked.  PSYCHIATRIC: The patient is alert and oriented x 3.  SKIN: No obvious rash, lesion, or ulcer.  LABORATORY PANEL:  Female CBC Recent Labs  Lab 02/27/18 0438  WBC 8.8  HGB 11.1*  HCT 36.9  PLT 318   ------------------------------------------------------------------------------------------------------------------ Chemistries  Recent Labs  Lab 02/25/18 1258 02/25/18 1259  02/27/18 0438  NA  --  140   < > 137  K  --  4.6   < > 4.1  CL  --  89*   < > 89*  CO2  --  41*   < > 39*  GLUCOSE  --  229*   < > 209*  BUN  --  20   < > 29*  CREATININE  --  0.49   < > 0.48  CALCIUM  --  9.1   < > 9.5  MG 1.7  --   --   --   AST  --  17  --   --   ALT  --  14  --   --   ALKPHOS  --  52  --   --   BILITOT  --  0.7  --   --    < > =  values in this interval not displayed.   RADIOLOGY:  No results found. ASSESSMENT AND PLAN:   Acute on chronic respiratory failure with hypoxia and hypercapnia due to COPD exacerbation- improved, now off BiPAP -Clinically feeling much better -Taper IV Solu-Medrol to p.o. prednisone  -Respiratory therapy for chest physical therapy -Duonebs as needed -Continue pulmicort -Patient chronically lives on 2 L of oxygen at home will continue the same -Procalcitonin negative, so will not start antibiotics -Patient admits that she quit smoking 14 years ago -Needs noninvasive ventilator at the  time of discharge to use nightly as recommended by the intensivist  Hypertension- BPs well-controlled -Continue home meds -IV hydralazine prn  Type 2 diabetes- blood sugars elevated with steroids -Change SSI to from sensitive to moderate  OSA- stable -BiPAP qhs per pulm recs  Normocytic anemia- Hgb at baseline -Stable  -Generalized weakness PT consult -recommending home health PT  All the records are reviewed and case discussed with Care Management/Social Worker regarding noninvasive ventilator   Management plans discussed with the patient, family and they are in agreement.  CODE STATUS: Full Code  TOTAL TIME TAKING CARE OF THIS PATIENT: 28   minutes.   More than 50% of the time was spent in counseling/coordination of care: YES  POSSIBLE D/C IN 1-2 DAYS, DEPENDING ON CLINICAL CONDITION.   Nicholes Mango M.D on 02/28/2018 at 3:57 PM  Between 7am to 6pm - Pager - [phone]   After 6pm go to [email or web address] - password [email or web address] [facility]  [employer]  Hospitalists  Office  [phone]  CC: Primary care physician; Adin Hector, MD  Note: This dictation was prepared with Dragon dictation along with smaller phrase technology. Any transcriptional errors that result from this process are unintentional.

## 2018-03-01 LAB — GLUCOSE, CAPILLARY
GLUCOSE-CAPILLARY: 150 mg/dL — AB (ref 70–99)
Glucose-Capillary: 174 mg/dL — ABNORMAL HIGH (ref 70–99)
Glucose-Capillary: 233 mg/dL — ABNORMAL HIGH (ref 70–99)
Glucose-Capillary: 260 mg/dL — ABNORMAL HIGH (ref 70–99)

## 2018-03-01 MED ORDER — SODIUM CHLORIDE 0.9% FLUSH
3.0000 mL | Freq: Two times a day (BID) | INTRAVENOUS | Status: DC
  Administered 2018-03-01 – 2018-03-03 (×4): 3 mL via INTRAVENOUS

## 2018-03-01 NOTE — Care Management Important Message (Signed)
Copy of signed IM left with patient in room.  

## 2018-03-01 NOTE — Clinical Social Work Note (Signed)
Clinical Social Work Assessment  Patient Details  Name: Belinda Jenkins MRN: [id number] Date of Birth: 1950/11/03  Date of referral:  03/01/18               Reason for consult:  Facility Placement                Permission sought to share information with:  Case Manager, Customer service manager, Family Supports Permission granted to share information::  Yes, Verbal Permission Granted  Name::      SNF  Agency::   [facility]   Relationship::     Contact Information:     Housing/Transportation Living arrangements for the past 2 months:  Single Family Home Source of Information:  Patient Patient Interpreter Needed:  None Criminal Activity/Legal Involvement Pertinent to Current Situation/Hospitalization:  No - Comment as needed Significant Relationships:  Adult Children, Spouse Lives with:  Spouse Do you feel safe going back to the place where you live?  Yes Need for family participation in patient care:  Yes (Comment)  Care giving concerns:  Patient lives with husband in [place]    Social Worker assessment / plan:  CSW consulted for SNF placement. CSW met with patient and daughter Dulce Sellar at bedside. Patient reports that she lives at home with husband and is on chronic O2 at home. Patient reports that she is very weak and does not feel that she can return home yet. Patient and family are requesting that patient go to SNF for short term rehab. CSW explained PT recommendation of home with home health to patient and family. CSW also explained that Medicare may not cover SNF if the recommendation is not SNF. Family states understanding and still wants to pursue SNF. CSW initiated bed search and sent referral. Patient and family prefer [facility]. CSW spoke with Liliane Channel at [facility] and he is able to offer a bed for patient. CSW accepted bed for patient.  CSW notified patient and daughter that [facility] a bed for patient. CSW will continue to follow for discharge  planning.   Employment status:  Retired Forensic scientist:  Medicare PT Recommendations:  Home with [place] / Referral to community resources:     Patient/Family's Response to care:  Patient and family thanked CSW for assistance   Patient/Family's Understanding of and Emotional Response to Diagnosis, Current Treatment, and Prognosis:  Patient and family are in agreement with discharge to [facility].    Emotional Assessment Appearance:  Appears stated age Attitude/Demeanor/Rapport:    Affect (typically observed):  Accepting, Pleasant Orientation:  Oriented to Self, Oriented to Place, Oriented to  Time, Oriented to Situation Alcohol / Substance use:  Not Applicable Psych involvement (Current and /or in the community):  No (Comment)  Discharge Needs  Concerns to be addressed:  Discharge Planning Concerns Readmission within the last 30 days:  Yes Current discharge risk:  Chronically ill, Dependent with Mobility Barriers to Discharge:  Continued Medical Work up   [employer], [place] 03/01/2018, 2:15 PM

## 2018-03-01 NOTE — Plan of Care (Signed)
  Problem: Activity: Goal: Risk for activity intolerance will decrease Outcome: Progressing Note:  Up to bathroom with walker and one assist, tolerating well   Problem: Nutrition: Goal: Adequate nutrition will be maintained Outcome: Progressing   Problem: Coping: Goal: Level of anxiety will decrease Outcome: Progressing   Problem: Elimination: Goal: Will not experience complications related to urinary retention Outcome: Progressing   Problem: Pain Managment: Goal: General experience of comfort will improve Outcome: Progressing Note:  No complaints of pain this shift   Problem: Safety: Goal: Ability to remain free from injury will improve Outcome: Progressing   Problem: Skin Integrity: Goal: Risk for impaired skin integrity will decrease Outcome: Progressing

## 2018-03-01 NOTE — Progress Notes (Signed)
Patient did not wear bipap last night. She states she understands that she needs too but she feels like she is smothering when we put it on her and she cannot catch her breath and it scares her. I titrated pressures back and sat with her for a few minutes. She was able tolerate therapy better. Asked her to have RN to call should problem arise

## 2018-03-01 NOTE — Plan of Care (Signed)

## 2018-03-01 NOTE — NC FL2 (Signed)
[place] LEVEL OF CARE SCREENING TOOL     IDENTIFICATION  Patient Name: Belinda Jenkins Birthdate: [birth year] Sex: female Admission Date (Current Location): 02/25/2018  [place] and [place] Number:  Engineering geologist and Address:  [facility], [address], [place], [place] [address]      Provider Number: [id number]  Attending Physician Name and Address:  Max Sane, MD  Relative Name and Phone Number:  Sonia Side- daughter [phone]    Current Level of Care: Hospital Recommended Level of Care: [place] Prior Approval Number:    Date Approved/Denied:   PASRR Number: [id number]  Discharge Plan: SNF    Current Diagnoses: Patient Active Problem List   Diagnosis Date Noted  . Acute on chronic respiratory failure with hypoxia ([place]) 02/25/2018  . Respiratory failure ([place]) 02/08/2018  . Hx of colonic polyps   . Benign neoplasm of rectosigmoid junction     Orientation RESPIRATION BLADDER Height & Weight     Self, Time, Place  O2(2.5 liters ) Continent Weight: 131 lb 11.2 oz (59.7 kg) Height:  5\' 2"  (157.5 cm)  BEHAVIORAL SYMPTOMS/MOOD NEUROLOGICAL BOWEL NUTRITION STATUS  (none) (none) Continent Diet(Heart Healthy )  AMBULATORY STATUS COMMUNICATION OF NEEDS Skin   Extensive Assist Verbally Normal                       Personal Care Assistance Level of Assistance  Bathing, Feeding, Dressing Bathing Assistance: Limited assistance Feeding assistance: Independent Dressing Assistance: Limited assistance     Functional Limitations Info  Sight, Hearing, Speech Sight Info: Adequate Hearing Info: Adequate Speech Info: Adequate    SPECIAL CARE FACTORS FREQUENCY  PT (By licensed PT), OT (By licensed OT)     PT Frequency: 5 OT Frequency: 5            Contractures Contractures Info: Not present    Additional Factors Info  Code Status, Allergies Code Status Info: Full Code   Allergies Info: Sulfamethoxazole-trimethoprim, Morphine, Oxycodone-acetaminophen, Sertraline, Adhesive Tape           Current Medications (03/01/2018):  This is the current hospital active medication list Current Facility-Administered Medications  Medication Dose Route Frequency Provider Last Rate Last Dose  . albuterol (PROVENTIL) (2.5 MG/3ML) 0.083% nebulizer solution 2.5 mg  2.5 mg Nebulization Q2H PRN Demetrios Loll, MD      . ALPRAZolam Duanne Moron) tablet 0.5 mg  0.5 mg Oral BID PRN Bradly Bienenstock, NP   0.5 mg at 02/27/18 2138  . bisacodyl (DULCOLAX) EC tablet 5 mg  5 mg Oral Daily PRN Demetrios Loll, MD      . budesonide (PULMICORT) nebulizer solution 0.5 mg  0.5 mg Nebulization BID Demetrios Loll, MD   0.5 mg at 03/01/18 0847  . enoxaparin (LOVENOX) injection 40 mg  40 mg Subcutaneous Q24H Demetrios Loll, MD   40 mg at 02/28/18 2009  . guaiFENesin-dextromethorphan (ROBITUSSIN DM) 100-10 MG/5ML syrup 5 mL  5 mL Oral Q4H PRN Demetrios Loll, MD      . hydrALAZINE (APRESOLINE) injection 10 mg  10 mg Intravenous Q6H PRN Demetrios Loll, MD      . insulin aspart (novoLOG) injection 0-15 Units  0-15 Units Subcutaneous TID WC Mayo, Pete Pelt, MD   3 Units at 03/01/18 1136  . insulin aspart (novoLOG) injection 0-5 Units  0-5 Units Subcutaneous QHS Demetrios Loll, MD   3 Units at 02/28/18 2208  . insulin glargine (LANTUS) injection 6 Units  6 Units Subcutaneous Daily Lahoma Rocker, MD   6 Units at 03/01/18 0825  . ipratropium-albuterol (DUONEB) 0.5-2.5 (3) MG/3ML nebulizer solution 3 mL  3 mL Nebulization TID Nicholes Mango, MD   3 mL at 03/01/18 0847  . irbesartan (AVAPRO) tablet 150 mg  150 mg Oral Daily Demetrios Loll, MD   150 mg at 03/01/18 0825  . metoprolol tartrate (LOPRESSOR) tablet 12.5 mg  12.5 mg Oral BID Demetrios Loll, MD   12.5 mg at 03/01/18 0824  . ondansetron (ZOFRAN) tablet 4 mg  4 mg Oral Q6H PRN Demetrios Loll, MD       Or  . ondansetron [facility]) injection 4 mg  4 mg Intravenous Q6H PRN Demetrios Loll, MD      .  predniSONE (DELTASONE) tablet 50 mg  50 mg Oral Q breakfast Gouru, Aruna, MD   50 mg at 03/01/18 0824  . senna-docusate (Senokot-S) tablet 1 tablet  1 tablet Oral QHS PRN Demetrios Loll, MD         Discharge Medications: Please see discharge summary for a list of discharge medications.  Relevant Imaging Results:  Relevant Lab Results:   Additional Information SSN: [id number]  Annamaria Boots, [place]

## 2018-03-01 NOTE — Care Management Note (Signed)
Case Management Note  Patient Details  Name: Belinda Jenkins MRN: [id number] Date of Birth: June 30, 1950  Subjective/Objective:   To patient room to discuss patient and family's change of mind for discharge disposition.  They are now wanting to discharge to SNF.  They would prefer Bel[facility] Jenkins.  Explained to patient and family that skilled nursing facilities do not allow NIV's which she has been worked up for.  They would still like patient to discharge to SNF.  Notified Belinda Jenkins, Belinda Jenkins.  She will begin authorization.  Patient has traditional medicare.   Otherwise, No difficulties obtaining medications or with transportation.  Current with PCP.  Lives with husband.  Is on 2l O2 [place] chronically, uses a walker.                Action/Plan:   Expected Discharge Date:  02/28/18               Expected Discharge Plan:  Skilled Nursing Facility  In-House Referral:  Clinical Social Work  Discharge planning Services  CM Consult  Post Acute Care Choice:    Choice offered to:     DME Arranged:    DME Agency:     HH Arranged:    [place] Agency:     Status of Service:  In process, will continue to follow  If discussed at Long Length of Stay Meetings, dates discussed:    Additional Comments:  Belinda Rafter, RN 03/01/2018, 11:45 AM

## 2018-03-01 NOTE — Progress Notes (Signed)
[facility] at [place] NAME: Belinda Jenkins    MR#:  [id number]  DATE OF BIRTH:  12-11-1950  SUBJECTIVE:  SOB improving. Son and husband at bedside demanding placement at [facility] (they've talked with someone there and says she will stay there 20 days) REVIEW OF SYSTEMS:  Review of Systems  Constitutional: Negative for chills and fever.  HENT: Negative for congestion and sore throat.   Eyes: Negative for blurred vision and double vision.  Respiratory: Positive for shortness of breath. Negative for cough.   Cardiovascular: Negative for chest pain, palpitations and leg swelling.  Gastrointestinal: Negative for abdominal pain, nausea and vomiting.  Genitourinary: Negative for dysuria, frequency and urgency.  Musculoskeletal: Negative for back pain and neck pain.  Neurological: Negative for dizziness and headaches.  Psychiatric/Behavioral: Negative for depression. The patient is not nervous/anxious.    DRUG ALLERGIES:   Allergies  Allergen Reactions  . Sulfamethoxazole-Trimethoprim Other (See Comments)    Sulfa causes tingling  . Morphine     Other reaction(s): Other (See Comments) Morphine causes mind altering.  . Oxycodone-Acetaminophen     Other reaction(s): Other (See Comments) Percocet causes mind altering.  . Sertraline Other (See Comments)    Sertraline causes worsening depression.  . Adhesive [Tape] Rash    Some bandaids cause rash   VITALS:  Blood pressure 133/67, pulse 81, temperature 98.8 F (37.1 C), temperature source Oral, resp. rate 20, height 5' 2"  (1.575 m), weight 59.7 kg, SpO2 97 %. PHYSICAL EXAMINATION:  Physical Exam  GENERAL:  67 y.o.-year-old patient lying in the bed with no acute distress.  EYES: Pupils equal, round, reactive to light and accommodation. No scleral icterus. Extraocular muscles intact.  HEENT: Head atraumatic, normocephalic. Oropharynx and nasopharynx clear. Moist mucous membranes. NECK:   Supple, no jugular venous distention. No thyroid enlargement, no tenderness.  LUNGS: +Diminished breath sounds bilaterally, no wheezing, rales,rhonchi or crepitation. No use of accessory muscles of respiration.  in place. CARDIOVASCULAR: RRR, S1, S2 normal. No murmurs, rubs, or gallops.  ABDOMEN: Soft, nontender, nondistended. Bowel sounds present.  EXTREMITIES: No pedal edema, cyanosis, or clubbing.  NEUROLOGIC: Cranial nerves II through XII are intact. +global weakness. Sensation intact. Gait not checked.  PSYCHIATRIC: The patient is alert and oriented x 3.  SKIN: No obvious rash, lesion, or ulcer.  LABORATORY PANEL:  Female CBC Recent Labs  Lab 02/27/18 0438  WBC 8.8  HGB 11.1*  HCT 36.9  PLT 318   ------------------------------------------------------------------------------------------------------------------ Chemistries  Recent Labs  Lab 02/25/18 1258 02/25/18 1259  02/27/18 0438  NA  --  140   < > 137  K  --  4.6   < > 4.1  CL  --  89*   < > 89*  CO2  --  41*   < > 39*  GLUCOSE  --  229*   < > 209*  BUN  --  20   < > 29*  CREATININE  --  0.49   < > 0.48  CALCIUM  --  9.1   < > 9.5  MG 1.7  --   --   --   AST  --  17  --   --   ALT  --  14  --   --   ALKPHOS  --  52  --   --   BILITOT  --  0.7  --   --    < > = values in this interval not  displayed.   RADIOLOGY:  No results found. ASSESSMENT AND PLAN:   * Acute on chronic respiratory failure with hypoxia and hypercapnia due to COPD exacerbation- improved, now off BiPAP -Clinically feeling much better -Switched IV Solu-Medrol to p.o. prednisone  -Respiratory therapy for chest physical therapy -Duonebs as needed -Continue pulmicort -Patient chronically lives on 2 L of oxygen at home will continue the same -Procalcitonin negative, so hold off antibiotics -Patient admits that she quit smoking 14 years ago -Needs noninvasive ventilator at the time of discharge to use nightly as recommended by the  intensivist  * Hypertension- BPs well-controlled -Continue home meds -IV hydralazine prn  Type 2 diabetes- blood sugars elevated with steroids -Continue SSI for now  OSA- stable -BiPAP qhs per pulm recs  Normocytic anemia- Hgb at baseline -Stable  -Generalized weakness PT consult -recommending home health PT but family demanding placement at [facility].   I agree with PT recommendations although knowing family (husband and son) she is high risk for readmissions if their needs are not met. They refused [facility] services last time as she didn't go to STR/SNF.     All the records are reviewed and case discussed with Care Management/Social Worker regarding noninvasive ventilator   Management plans discussed with the patient, family (husband and son) and they are in agreement.  CODE STATUS: Full Code  TOTAL TIME TAKING CARE OF THIS PATIENT: 35 minutes.   More than 50% of the time was spent in counseling/coordination of care: YES  POSSIBLE D/C IN 1-2 DAYS, DEPENDING ON CLINICAL CONDITION.   Max Sane M.D on 03/01/2018 at 2:01 PM  Between 7am to 6pm - Pager - [phone]   After 6pm go to [email or web address] - password [email or web address] [facility]  [facility] [place] Hospital[facility]  Office  [phone]  CC: Primary care physician; Adin Hector, MD  Note: This dictation was prepared with Dragon dictation along with smaller phrase technology. Any transcriptional errors that result from this process are unintentional.

## 2018-03-02 LAB — BASIC METABOLIC PANEL
ANION GAP: 12 (ref 5–15)
BUN: 46 mg/dL — ABNORMAL HIGH (ref 8–23)
CHLORIDE: 93 mmol/L — AB (ref 98–111)
CO2: 35 mmol/L — AB (ref 22–32)
Calcium: 9.3 mg/dL (ref 8.9–10.3)
Creatinine, Ser: 0.51 mg/dL (ref 0.44–1.00)
GFR calc non Af Amer: >60 mL/min (ref >60)
Glucose, Bld: 225 mg/dL — ABNORMAL HIGH (ref 70–99)
POTASSIUM: 3.9 mmol/L (ref 3.5–5.1)
Sodium: 140 mmol/L (ref 135–145)

## 2018-03-02 LAB — CBC
HEMATOCRIT: 34.4 % — AB (ref 36.0–46.0)
HEMOGLOBIN: 10.1 g/dL — AB (ref 12.0–15.0)
MCH: 26.9 pg (ref 26.0–34.0)
MCHC: 29.4 g/dL — ABNORMAL LOW (ref 30.0–36.0)
MCV: 91.7 fL (ref 80.0–100.0)
NRBC: 0 % (ref 0.0–0.2)
Platelets: 276 10*3/uL (ref 150–400)
RBC: 3.75 MIL/uL — ABNORMAL LOW (ref 3.87–5.11)
RDW: 13.7 % (ref 11.5–15.5)
WBC: 6 10*3/uL (ref 4.0–10.5)

## 2018-03-02 LAB — GLUCOSE, CAPILLARY
GLUCOSE-CAPILLARY: 141 mg/dL — AB (ref 70–99)
GLUCOSE-CAPILLARY: 156 mg/dL — AB (ref 70–99)
Glucose-Capillary: 296 mg/dL — ABNORMAL HIGH (ref 70–99)
Glucose-Capillary: 194 mg/dL — ABNORMAL HIGH (ref 70–99)

## 2018-03-02 NOTE — Progress Notes (Signed)
CCMD called, pt having multifocal atrial tachycardia, HR in 140's, EKG ordered and Dr. Carlynn Spry made aware.

## 2018-03-02 NOTE — Progress Notes (Signed)
[place] [facility] [place] NAME: Shy Guallpa    MR#:  [id number]  DATE OF BIRTH:  08/02/1950  SUBJECTIVE:  SOB improving, multifocal atrial tachycardia, HR in 140's per nursing, waiting for insurance Auth REVIEW OF SYSTEMS:  Review of Systems  Constitutional: Negative for chills and fever.  HENT: Negative for congestion and sore throat.   Eyes: Negative for blurred vision and double vision.  Respiratory: Positive for shortness of breath. Negative for cough.   Cardiovascular: Negative for chest pain, palpitations and leg swelling.  Gastrointestinal: Negative for abdominal pain, nausea and vomiting.  Genitourinary: Negative for dysuria, frequency and urgency.  Musculoskeletal: Negative for back pain and neck pain.  Neurological: Negative for dizziness and headaches.  Psychiatric/Behavioral: Negative for depression. The patient is not nervous/anxious.    DRUG ALLERGIES:   Allergies  Allergen Reactions  . Sulfamethoxazole-Trimethoprim Other (See Comments)    Sulfa causes tingling  . Morphine     Other reaction(s): Other (See Comments) Morphine causes mind altering.  . Oxycodone-Acetaminophen     Other reaction(s): Other (See Comments) Percocet causes mind altering.  . Sertraline Other (See Comments)    Sertraline causes worsening depression.  . Adhesive [Tape] Rash    Some bandaids cause rash   VITALS:  Blood pressure 134/71, pulse 89, temperature 98.4 F (36.9 C), temperature source Oral, resp. rate 19, height 5' 2"  (1.575 m), weight 59.7 kg, SpO2 98 %. PHYSICAL EXAMINATION:  Physical Exam  GENERAL:  67 y.o.-year-old patient lying in the bed with no acute distress.  EYES: Pupils equal, round, reactive to light and accommodation. No scleral icterus. Extraocular muscles intact.  HEENT: Head atraumatic, normocephalic. Oropharynx and nasopharynx clear. Moist mucous membranes. NECK:  Supple, no jugular venous distention. No thyroid  enlargement, no tenderness.  LUNGS: +Diminished breath sounds bilaterally, no wheezing, rales,rhonchi or crepitation. No use of accessory muscles of respiration. Day in place. CARDIOVASCULAR: RRR, S1, S2 normal. No murmurs, rubs, or gallops.  ABDOMEN: Soft, nontender, nondistended. Bowel sounds present.  EXTREMITIES: No pedal edema, cyanosis, or clubbing.  NEUROLOGIC: Cranial nerves II through XII are intact. +global weakness. Sensation intact. Gait not checked.  PSYCHIATRIC: The patient is alert and oriented x 3.  SKIN: No obvious rash, lesion, or ulcer.  LABORATORY PANEL:  Female CBC Recent Labs  Lab 03/02/18 0412  WBC 6.0  HGB 10.1*  HCT 34.4*  PLT 276   ------------------------------------------------------------------------------------------------------------------ Chemistries  Recent Labs  Lab 02/25/18 1258 02/25/18 1259  03/02/18 0412  NA  --  140   < > 140  K  --  4.6   < > 3.9  CL  --  89*   < > 93*  CO2  --  41*   < > 35*  GLUCOSE  --  229*   < > 225*  BUN  --  20   < > 46*  CREATININE  --  0.49   < > 0.51  CALCIUM  --  9.1   < > 9.3  MG 1.7  --   --   --   AST  --  17  --   --   ALT  --  14  --   --   ALKPHOS  --  52  --   --   BILITOT  --  0.7  --   --    < > = values in this interval not displayed.   RADIOLOGY:  No results found. ASSESSMENT AND  PLAN:   * Acute on chronic respiratory failure with hypoxia and hypercapnia due to COPD exacerbation- improved - Continue p.o. prednisone  -Duonebs as needed -Continue pulmicort -Patient chronically lives on 2 L of oxygen at home will continue the same, may need BiPAP at night if facility allows -Procalcitonin negative, so hold off antibiotics -Patient admits that she quit smoking 14 years ago -Needs noninvasive ventilator at the time of discharge to use nightly as recommended by the intensivist  * MAT - HR in 140s - await EKG - try b-blocker (she is on metoprolol which should help) if not CCB  *  Hypertension- BPs well-controlled -Continue home meds -IV hydralazine prn  Type 2 diabetes- blood sugars elevated with steroids -Continue SSI for now  OSA- stable -BiPAP qhs per pulm recs  Normocytic anemia- Hgb at baseline -Stable  -Generalized weakness PT consult -recommending home health PT but family demanding placement at [facility].   I agree with PT recommendations although knowing family (husband and son) she is high risk for readmissions if their needs are not met. They refused [place] services last time as she didn't go to STR/SNF. Waiting for Insurance auth     All the records are reviewed and case discussed with Care Management/Social Worker regarding noninvasive ventilator   Management plans discussed with the patient, family (husband and son) and they are in agreement.  CODE STATUS: Full Code  TOTAL TIME TAKING CARE OF THIS PATIENT: 35 minutes.   More than 50% of the time was spent in counseling/coordination of care: YES  POSSIBLE D/C IN 1-2 DAYS, DEPENDING ON CLINICAL CONDITION.   Max Sane M.D on 03/02/2018 at 9:35 AM  Between 7am to 6pm - Pager - [phone]   After 6pm go to [email or web address] - password [email or web address] [facility]  [facility]  Office  [phone]  CC: Primary care physician; Adin Hector, MD  Note: This dictation was prepared with Dragon dictation along with smaller phrase technology. Any transcriptional errors that result from this process are unintentional.

## 2018-03-02 NOTE — Care Management (Signed)
Barrier to discharge-atrial tachycardia; heart rate in the 140's this morning.

## 2018-03-02 NOTE — Progress Notes (Signed)
Inpatient Diabetes Program Recommendations  AACE/ADA: New Consensus Statement on Inpatient Glycemic Control (2015)  Target Ranges:  Prepandial:   less than 140 mg/dL      Peak postprandial:   less than 180 mg/dL (1-2 hours)      Critically ill patients:  140 - 180 mg/dL   Lab Results  Component Value Date   GLUCAP 296 (H) 03/02/2018    Review of Glycemic Control Results for MICHELL, GIULIANO (MRN [id number]) as of 03/02/2018 14:02  Ref. Range 03/01/2018 11:05 03/01/2018 16:04 03/01/2018 20:56 03/02/2018 07:25 03/02/2018 11:25  Glucose-Capillary Latest Ref Range: 70 - 99 mg/dL 174 (H) 233 (H) 260 (H) 141 (H) 296 (H)    Diabetes history: DM 2 Outpatient Diabetes medications:  Metformin 1000 mg bid Current orders for Inpatient glycemic control:  Novolog moderate tid with meals and HS, Lantus 6 units daily Prednisone 50 mg daily with breakfast Inpatient Diabetes Program Recommendations:    While on PO Prednisone, please consider adding Novolog meal coverage 3 units tid with meals.    Thanks,  Adah Perl, RN, BC-ADM Inpatient Diabetes Coordinator Pager [phone] (8a-5p)

## 2018-03-02 NOTE — Progress Notes (Signed)
Physical Therapy Treatment Patient Details Name: Belinda Jenkins MRN: [id number] DOB: [birth year] Today's Date: 03/02/2018    History of Present Illness presented to ER secondary to progressive SOB, generalized weakness; admitted with acute on chronic respiratory failure with hypoxia, hypercapnea due to COPD exacerbation.  initiall requiring BiPAP; now weaned to 2L O2.    PT Comments    Due to chart review and noted HR issues earlier, nursing contacted and states pt able to participate with PT. Pt agreeable to PT; overall not feeling well (fatigued/weak). Pt participates in stand exercises at edge of bed as well as supine exercises after return to bed. HR increased from 88 bpm at rest to between 116 and 131 with limited seated and stand exercises. 1 seated rest break between stand work. Initial dizziness with first stand that subsides after approximately 1 minute. Pt/daughter educated on supine exercises to perform, as HR improved with limited supine exercises with assist as needed. Continue PT to progress strength/endurance within safe HR parameters to improve functional mobility towards baseline.   Follow Up Recommendations  Home health PT;Supervision/Assistance - 24 hour     Equipment Recommendations  Rolling walker with 5" wheels    Recommendations for Other Services       Precautions / Restrictions Precautions Precautions: Fall Restrictions Weight Bearing Restrictions: No    Mobility  Bed Mobility Overal bed mobility: Needs Assistance Bed Mobility: Supine to Sit;Sit to Supine     Supine to sit: Supervision Sit to supine: Min guard   General bed mobility comments: Max A to reposition upward in bed  Transfers Overall transfer level: Needs assistance Equipment used: Rolling walker (2 wheeled) Transfers: Sit to/from Stand Sit to Stand: Min guard         General transfer comment: cues for proper hand placement; slow to rise and mild dizziness for approximately 1  min before subsiding  Ambulation/Gait             General Gait Details: deferred due to increased HR with seated and stand activity   Stairs             Wheelchair Mobility    Modified Rankin (Stroke Patients Only)       Balance Overall balance assessment: Needs assistance Sitting-balance support: Feet supported Sitting balance-Leahy Scale: Good     Standing balance support: Bilateral upper extremity supported Standing balance-Leahy Scale: Fair                              Cognition Arousal/Alertness: Awake/alert(Fatigued) Behavior During Therapy: WFL for tasks assessed/performed Overall Cognitive Status: Within Functional Limits for tasks assessed                                        Exercises General Exercises - Lower Extremity Ankle Circles/Pumps: AROM;Both;10 reps;Supine Quad Sets: Strengthening;Both;10 reps;Supine Gluteal Sets: Strengthening;Both;10 reps;Supine Long Arc Quad: AROM;Both;10 reps;Seated Heel Slides: AROM;Both;10 reps;Supine Hip ABduction/ADduction: Strengthening;Both;10 reps;Standing;Other (comment)(also AAROM 10x ea supine) Straight Leg Raises: Strengthening;Both;10 reps;Standing Hip Flexion/Marching: Strengthening;Both;20 reps;Standing    General Comments        Pertinent Vitals/Pain Pain Assessment: No/denies pain    Home Living                      Prior Function            PT Goals (  current goals can now be found in the care plan section) Progress towards PT goals: Progressing toward goals(slowly)    Frequency    Min 2X/week      PT Plan Current plan remains appropriate    Co-evaluation              AM-PAC PT "6 Clicks" Daily Activity  Outcome Measure  Difficulty turning over in bed (including adjusting bedclothes, sheets and blankets)?: A Little Difficulty moving from lying on back to sitting on the side of the bed? : A Little Difficulty sitting down on and  standing up from a chair with arms (e.g., wheelchair, bedside commode, etc,.)?: Unable Help needed moving to and from a bed to chair (including a wheelchair)?: A Little Help needed walking in hospital room?: A Little Help needed climbing 3-5 steps with a railing? : A Lot 6 Click Score: 15    End of Session Equipment Utilized During Treatment: Gait belt;Oxygen Activity Tolerance: Patient limited by fatigue;Other (comment)(increased HR) Patient left: in bed;with call bell/phone within reach;with bed alarm set;with family/visitor present   PT Visit Diagnosis: Other abnormalities of gait and mobility (R26.89);Muscle weakness (generalized) (M62.81);Difficulty in walking, not elsewhere classified (R26.2)     Time: 9407-6808 PT Time Calculation (min) (ACUTE ONLY): 18 min  Charges:  $Therapeutic Exercise: 8-22 mins                      Larae Grooms, PTA 03/02/2018, 4:03 PM

## 2018-03-02 NOTE — Plan of Care (Signed)
  Problem: Activity: Goal: Risk for activity intolerance will decrease Outcome: Progressing Note:  Up in room/bathroom with 1 assist & walker tolerating well   Problem: Nutrition: Goal: Adequate nutrition will be maintained Outcome: Progressing   Problem: Coping: Goal: Level of anxiety will decrease Outcome: Progressing Note:  Xanax given once before bed & noninvasive ventilator put on   Problem: Elimination: Goal: Will not experience complications related to urinary retention Outcome: Progressing   Problem: Pain Managment: Goal: General experience of comfort will improve Outcome: Progressing Note:  No complaints of pain this shift   Problem: Safety: Goal: Ability to remain free from injury will improve Outcome: Progressing   Problem: Skin Integrity: Goal: Risk for impaired skin integrity will decrease Outcome: Progressing

## 2018-03-02 NOTE — Plan of Care (Signed)
  Problem: Clinical Measurements: Goal: Will remain free from infection Outcome: Progressing Note:  Remains afebrile Goal: Diagnostic test results will improve Outcome: Progressing Note:  BUN 46 this am Goal: Respiratory complications will improve Outcome: Progressing Note:  No shortness of breath this shift   Problem: Clinical Measurements: Goal: Cardiovascular complication will be avoided Outcome: Not Progressing Note:  Had episode of atrial tachycardia this am

## 2018-03-03 LAB — BASIC METABOLIC PANEL
Anion gap: 5 (ref 5–15)
BUN: 42 mg/dL — ABNORMAL HIGH (ref 8–23)
CHLORIDE: 97 mmol/L — AB (ref 98–111)
CO2: 40 mmol/L — ABNORMAL HIGH (ref 22–32)
CREATININE: 0.47 mg/dL (ref 0.44–1.00)
Calcium: 8.9 mg/dL (ref 8.9–10.3)
GFR calc non Af Amer: >60 mL/min (ref >60)
Glucose, Bld: 122 mg/dL — ABNORMAL HIGH (ref 70–99)
POTASSIUM: 3.9 mmol/L (ref 3.5–5.1)
SODIUM: 142 mmol/L (ref 135–145)

## 2018-03-03 LAB — CBC
HCT: 33.1 % — ABNORMAL LOW (ref 36.0–46.0)
HEMOGLOBIN: 10.1 g/dL — AB (ref 12.0–15.0)
MCH: 27.4 pg (ref 26.0–34.0)
MCHC: 30.5 g/dL (ref 30.0–36.0)
MCV: 89.7 fL (ref 80.0–100.0)
Platelets: 267 10*3/uL (ref 150–400)
RBC: 3.69 MIL/uL — ABNORMAL LOW (ref 3.87–5.11)
RDW: 13.7 % (ref 11.5–15.5)
WBC: 5.9 10*3/uL (ref 4.0–10.5)
nRBC: 0 % (ref 0.0–0.2)

## 2018-03-03 LAB — GLUCOSE, CAPILLARY
GLUCOSE-CAPILLARY: 111 mg/dL — AB (ref 70–99)
GLUCOSE-CAPILLARY: 262 mg/dL — AB (ref 70–99)

## 2018-03-03 MED ORDER — ALPRAZOLAM 0.25 MG PO TABS: 0.5000 mg | ORAL_TABLET | Freq: Two times a day (BID) | ORAL | 0 refills | Status: DC | PRN

## 2018-03-03 NOTE — Clinical Social Work Note (Addendum)
Patient to be d/c'ed today to [facility] room D6.   Patient and family agreeable to plans will transport via daughter's vehicle RN to call report [phone].  Patient's family is aware that patient will be discharging today, CSW spoke to patient's daughter [name] [phone].  Evette Cristal, MSW, [place]

## 2018-03-03 NOTE — Clinical Social Work Placement (Signed)
   CLINICAL SOCIAL WORK PLACEMENT  NOTE  Date:  03/03/2018  Patient Details  Name: Belinda Jenkins MRN: [id number] Date of Birth: January 22, 1951  Clinical Social Work is seeking post-discharge placement for this patient at the [place] level of care (*CSW will initial, date and re-position this form in  chart as items are completed):  Yes   Patient/family provided with [place] Work Department's list of facilities offering this level of care within the geographic area requested by the patient (or if unable, by the patient's family).  Yes   Patient/family informed of their freedom to choose among providers that offer the needed level of care, that participate in Medicare, Medicaid or managed care program needed by the patient, have an available bed and are willing to accept the patient.  Yes   Patient/family informed of [facility]'s ownership interest in [facility] and [facility], as well as of the fact that they are under no obligation to receive care at these facilities.  PASRR submitted to EDS on 03/01/18     PASRR number received on       Existing PASRR number confirmed on 03/01/18     FL2 transmitted to all facilities in geographic area requested by pt/family on 03/01/18     FL2 transmitted to all facilities within larger geographic area on       Patient informed that his/her managed care company has contracts with or will negotiate with certain facilities, including the following:        Yes   Patient/family informed of bed offers received.  Patient chooses bed at [facility]     Physician recommends and patient chooses bed at      Patient to be transferred to [facility] on 03/03/18.  Patient to be transferred to facility by Patient's daughter to transport.     Patient family notified on 03/03/18 of transfer.  Name of family member notified:  Patient's daughter [name] was informed  that patient is discharging today.     PHYSICIAN Please sign FL2, Please prepare prescriptions     Additional Comment:    _______________________________________________ Ross Ludwig, LCSWA 03/03/2018, 2:37 PM

## 2018-03-03 NOTE — Discharge Summary (Addendum)
[place] at [place] NAME: Belinda Jenkins    MR#:  [id number]  DATE OF BIRTH:  01-22-1951  DATE OF ADMISSION:  02/25/2018   ADMITTING PHYSICIAN: Demetrios Loll, MD  DATE OF DISCHARGE: 03/03/2018  PRIMARY CARE PHYSICIAN: Tama High III, MD   ADMISSION DIAGNOSIS:  SOB (shortness of breath) [R06.02] DISCHARGE DIAGNOSIS:  Active Problems:   Acute on chronic respiratory failure with hypoxia ([name])  SECONDARY DIAGNOSIS:   Past Medical History:  Diagnosis Date  . COPD (chronic obstructive pulmonary disease) ([place])   . Diabetes mellitus without complication ([place])   . Family history of adverse reaction to anesthesia    sister - slow to wake after 1 procedure  . Hypertension   . Vertigo    HOSPITAL COURSE:   * Acute on chronic respiratory failure with hypoxia and hypercapnia due to COPD exacerbation- improved and back to baseline - improved with nebs and steroids -Patient chronically lives on 2 L of oxygen at home will continue the same, may need BiPAP at night  -Procalcitonin negative, so hold off antibiotics  * MAT - resolved now. Continue b-blocker  * Hypertension- BPs well-controlled -Continue home meds  Type 2 diabetes- controlled   OSA- stable -BiPAP qhs as need per pulm recs  Normocytic anemia- Hgb at baseline -Stable DISCHARGE CONDITIONS:  fair CONSULTS OBTAINED:   DRUG ALLERGIES:   Allergies  Allergen Reactions  . Sulfamethoxazole-Trimethoprim Other (See Comments)    Sulfa causes tingling  . Morphine     Other reaction(s): Other (See Comments) Morphine causes mind altering.  . Oxycodone-Acetaminophen     Other reaction(s): Other (See Comments) Percocet causes mind altering.  . Sertraline Other (See Comments)    Sertraline causes worsening depression.  . Adhesive [Tape] Rash    Some bandaids cause rash   DISCHARGE MEDICATIONS:   Allergies as of 03/03/2018      Reactions   Sulfamethoxazole-trimethoprim Other (See Comments)   Sulfa causes tingling   Morphine    Other reaction(s): Other (See Comments) Morphine causes mind altering.   Oxycodone-acetaminophen    Other reaction(s): Other (See Comments) Percocet causes mind altering.   Sertraline Other (See Comments)   Sertraline causes worsening depression.   Adhesive [tape] Rash   Some bandaids cause rash      Medication List    STOP taking these medications   predniSONE 10 MG (21) Tbpk tablet Commonly known as:  STERAPRED UNI-PAK 21 TAB     TAKE these medications   alendronate 70 MG tablet Commonly known as:  FOSAMAX Take 70 mg by mouth once a week.   ALPRAZolam 0.25 MG tablet Commonly known as:  XANAX Take 2 tablets (0.5 mg total) by mouth 2 (two) times daily as needed for anxiety. What changed:    when to take this  reasons to take this   budesonide 0.5 MG/2ML nebulizer solution Commonly known as:  PULMICORT Take 2 mLs (0.5 mg total) by nebulization 2 (two) times daily.   ipratropium-albuterol 0.5-2.5 (3) MG/3ML Soln Commonly known as:  DUONEB Inhale 3 mLs into the lungs 4 (four) times daily. AND Q 3 hrs prn (wheezing/sob)   metFORMIN 1000 MG tablet Commonly known as:  GLUCOPHAGE Take 1,000 mg by mouth 2 (two) times daily.   metoprolol tartrate 25 MG tablet Commonly known as:  LOPRESSOR Take 12.5 mg by mouth 2 (two) times daily.   valsartan 160 MG tablet Commonly known as:  DIOVAN Take 160 mg  by mouth daily.   vitamin B-12 1000 MCG tablet Commonly known as:  CYANOCOBALAMIN Take 1,000 mcg by mouth daily.   Vitamin D3 50 MCG (2000 UT) capsule Take 2,000 Units by mouth daily.        DISCHARGE INSTRUCTIONS:   DIET:  Regular diet DISCHARGE CONDITION:  Good ACTIVITY:  Activity as tolerated OXYGEN:  Home Oxygen: Yes.    Oxygen Delivery: 2 liters/min via Patient connected to nasal cannula oxygen DISCHARGE LOCATION:  nursing home   If you experience worsening of  your admission symptoms, develop shortness of breath, life threatening emergency, suicidal or homicidal thoughts you must seek medical attention immediately by calling 911 or calling your MD immediately  if symptoms less severe.  You Must read complete instructions/literature along with all the possible adverse reactions/side effects for all the Medicines you take and that have been prescribed to you. Take any new Medicines after you have completely understood and accpet all the possible adverse reactions/side effects.   Please note  You were cared for by a hospitalist during your hospital stay. If you have any questions about your discharge medications or the care you received while you were in the hospital after you are discharged, you can call the unit and asked to speak with the hospitalist on call if the hospitalist that took care of you is not available. Once you are discharged, your primary care physician will handle any further medical issues. Please note that NO REFILLS for any discharge medications will be authorized once you are discharged, as it is imperative that you return to your primary care physician (or establish a relationship with a primary care physician if you do not have one) for your aftercare needs so that they can reassess your need for medications and monitor your lab values.    On the day of Discharge:  VITAL SIGNS:  Blood pressure 114/78, pulse (!) 103, temperature 98 F (36.7 C), temperature source Oral, resp. rate 18, height 5\' 2"  (1.575 m), weight 59.7 kg, SpO2 96 %. PHYSICAL EXAMINATION:  GENERAL:  67 y.o.-year-old patient lying in the bed with no acute distress.  EYES: Pupils equal, round, reactive to light and accommodation. No scleral icterus. Extraocular muscles intact.  HEENT: Head atraumatic, normocephalic. Oropharynx and nasopharynx clear.  NECK:  Supple, no jugular venous distention. No thyroid enlargement, no tenderness.  LUNGS: Normal breath sounds  bilaterally, no wheezing, rales,rhonchi or crepitation. No use of accessory muscles of respiration.  CARDIOVASCULAR: S1, S2 normal. No murmurs, rubs, or gallops.  ABDOMEN: Soft, non-tender, non-distended. Bowel sounds present. No organomegaly or mass.  EXTREMITIES: No pedal edema, cyanosis, or clubbing.  NEUROLOGIC: Cranial nerves II through XII are intact. Muscle strength 5/5 in all extremities. Sensation intact. Gait not checked.  PSYCHIATRIC: The patient is alert and oriented x 3.  SKIN: No obvious rash, lesion, or ulcer.  DATA REVIEW:   CBC Recent Labs  Lab 03/03/18 0302  WBC 5.9  HGB 10.1*  HCT 33.1*  PLT 267    Chemistries  Recent Labs  Lab 02/25/18 1258 02/25/18 1259  03/03/18 0302  NA  --  140   < > 142  K  --  4.6   < > 3.9  CL  --  89*   < > 97*  CO2  --  41*   < > 40*  GLUCOSE  --  229*   < > 122*  BUN  --  20   < > 42*  CREATININE  --  0.49   < > 0.47  CALCIUM  --  9.1   < > 8.9  MG 1.7  --   --   --   AST  --  17  --   --   ALT  --  14  --   --   ALKPHOS  --  52  --   --   BILITOT  --  0.7  --   --    < > = values in this interval not displayed.      Contact information for follow-up providers    Adin Hector, MD. Schedule an appointment as soon as possible for a visit in 5 day(s).   Specialty:  Internal Medicine Contact information: [place] [address] [phone]            Contact information for after-discharge care    Destination    [facility]REFERR[facility] .   Service:  Skilled Nursing Contact information: [address] [phone]                  Management plans discussed with the patient, family and they are in agreement.  CODE STATUS: Full Code   TOTAL TIME TAKING CARE OF THIS PATIENT: 45 minutes.    Max Sane M.D on 03/03/2018 at 10:40 AM  Between 7am to 6pm - Pager - [phone]  After 6pm go to  [email or web address] - password [email or web address] [facility]  [facility] [place] [facility]  Office  [phone]  CC: Primary care physician; Adin Hector, MD   Note: This dictation was prepared with Dragon dictation along with smaller phrase technology. Any transcriptional errors that result from this process are unintentional.

## 2018-03-03 NOTE — Progress Notes (Signed)
Report called to [facility] @ [place].

## 2018-03-03 NOTE — Discharge Instructions (Signed)

## 2018-03-03 NOTE — Progress Notes (Signed)
Pt discharged to [facility] via wc.  Instructions and rx given to pt.  Questions answered.  No distress.

## 2018-03-08 ENCOUNTER — Ambulatory Visit (INDEPENDENT_AMBULATORY_CARE_PROVIDER_SITE_OTHER): Payer: Medicare Other | Admitting: Pulmonary Disease

## 2018-03-08 ENCOUNTER — Encounter: Payer: Self-pay | Admitting: Pulmonary Disease

## 2018-03-08 VITALS — BP 120/68 | HR 76 | Ht 62.0 in | Wt 139.4 lb

## 2018-03-08 DIAGNOSIS — J449 Chronic obstructive pulmonary disease, unspecified: Secondary | ICD-10-CM | POA: Diagnosis not present

## 2018-03-08 DIAGNOSIS — J9611 Chronic respiratory failure with hypoxia: Secondary | ICD-10-CM

## 2018-03-08 DIAGNOSIS — J9612 Chronic respiratory failure with hypercapnia: Secondary | ICD-10-CM

## 2018-03-08 NOTE — Progress Notes (Addendum)
PULMONARY OFFICE FOLLOW UP NOTE  Requesting MD/Service: Hospitalist Date of initial consultation: 02/08/18 (in hospital) Reason for consultation: Ventilator dependent respiratory failure  PT PROFILE: 67 y.o. female former smoker with severe COPD @ baseline (FEV1 0.48 L in 2016), O2 dependent since approx 2010 brought to [facility] ED with altered mental status, hypercarbic on ABG, intubated for agonal respirations. In ED, suffered hypotension and brief pulselessness requiring 2 minutes chest compression.  Received 0.5 mg epinephrine. At baseline, struggles with ADLs but cognition fully intact.    Previously followed by [facility] pulmonary  DATA: 11/27/14 PFTs ([facility]): FVC: 1.31 L (48 %pred), FEV1: 0.48 L (22 %pred), FEV1/FVC: 46%   INTERVAL:  SUBJ:  I initially met her 10/28 when she was intubated for acute on chronic hypercarbic/hypoxemic respiratory failure.  Remarkably, she was extubated the following day.  She was discharged 11/01.  However, she was readmitted 11/14-11/20 again with COPD exacerbation requiring noninvasive ventilation.  After that hospitalization, she was discharged to a skilled nursing facility.  This is a scheduled follow-up.  Overall, she believes she is approximately 70% improved back to her baseline.  She remains dyspneic with minimal exertion.  At her baseline, she could not ambulate a flight of stairs or more than 50 m.  She has minimal cough and no significant sputum production.  At the skilled nursing facility, she is on scheduled nebulized bronchodilators and is using BiPAP with sleep.  She denies chest pain or hemoptysis.  She has a very flat affect and is not very forthcoming with history.  Past Medical History:  Diagnosis Date  . COPD (chronic obstructive pulmonary disease) (HCC)   . Diabetes mellitus without complication (HCC)   . Family history of adverse reaction to anesthesia    sister - slow to wake after 1 procedure  . Hypertension   . Vertigo     Past Surgical  History:  Procedure Laterality Date  . ABDOMINAL HYSTERECTOMY    . BACK SURGERY     L5  . BLEPHAROPLASTY Bilateral   . BREAST SURGERY     lumpectomy(x1), milk duct(x1)  . CATARACT EXTRACTION W/ INTRAOCULAR LENS  IMPLANT, BILATERAL    . COLONOSCOPY WITH PROPOFOL N/A 07/14/2016   Procedure: COLONOSCOPY WITH PROPOFOL;  Surgeon: Darren Wohl, MD;  Location: [facility];  Service: Endoscopy;  Laterality: N/A;  Diabetic - oral meds requests arrival around 8 AM  . HIP SURGERY Left   . POLYPECTOMY  07/14/2016   Procedure: POLYPECTOMY;  Surgeon: Darren Wohl, MD;  Location: [facility];  Service: Endoscopy;;    MEDICATIONS: I have reviewed all medications and confirmed regimen as documented  Social History   Socioeconomic History  . Marital status: Married    Spouse name: Not on file  . Number of children: Not on file  . Years of education: Not on file  . Highest education level: Not on file  Occupational History  . Not on file  Social Needs  . Financial resource strain: Not on file  . Food insecurity:    Worry: Not on file    Inability: Not on file  . Transportation needs:    Medical: Not on file    Non-medical: Not on file  Tobacco Use  . Smoking status: Former Smoker    Last attempt to quit: 04/15/2003    Years since quitting: 14.9  . Smokeless tobacco: Never Used  Substance and Sexual Activity  . Alcohol use: Yes    Alcohol/week: 1.0 standard drinks      Types: 1 Shots of liquor per week  . Drug use: Never  . Sexual activity: Not on file  Lifestyle  . Physical activity:    Days per week: Not on file    Minutes per session: Not on file  . Stress: Not on file  Relationships  . Social connections:    Talks on phone: Not on file    Gets together: Not on file    Attends religious service: Not on file    Active member of club or organization: Not on file    Attends meetings of clubs or organizations: Not on file    Relationship status: Not on file  . Intimate  partner violence:    Fear of current or ex partner: Not on file    Emotionally abused: Not on file    Physically abused: Not on file    Forced sexual activity: Not on file  Other Topics Concern  . Not on file  Social History Narrative  . Not on file    History reviewed. No pertinent family history.  ROS: No fever, myalgias/arthralgias, unexplained weight loss or weight gain No new focal weakness or sensory deficits No otalgia, hearing loss, visual changes, nasal and sinus symptoms, mouth and throat problems No neck pain or adenopathy No abdominal pain, N/V/D, diarrhea, change in bowel pattern No dysuria, change in urinary pattern   Vitals:   03/08/18 1009 03/08/18 1010  BP:  120/68  Pulse:  76  SpO2:  96%  Weight: 139 lb 6.4 oz (63.2 kg)   Height: 5' 2" (1.575 m)   O2 2 LPM [place]  EXAM:  Gen: Flat affect, No overt respiratory distress HEENT: NCAT, sclera white, oropharynx normal Neck: Supple without LAN, thyromegaly, JVD Lungs: breath sounds moderately diminished, percussion mildly hyperresonant, adventitious sounds: No wheezes Cardiovascular: RRR, no murmurs noted Abdomen: Soft, nontender, normal BS Ext: without clubbing, cyanosis.  Trace symmetric ankle edema Neuro: Generalized weakness, CNs grossly intact, motor and sensory intact Skin: Limited exam, no lesions noted  DATA:   BMP Latest Ref Rng & Units 03/03/2018 03/02/2018 02/27/2018  Glucose 70 - 99 mg/dL 122(H) 225(H) 209(H)  BUN 8 - 23 mg/dL 42(H) 46(H) 29(H)  Creatinine 0.44 - 1.00 mg/dL 0.47 0.51 0.48  Sodium 135 - 145 mmol/L 142 140 137  Potassium 3.5 - 5.1 mmol/L 3.9 3.9 4.1  Chloride 98 - 111 mmol/L 97(L) 93(L) 89(L)  CO2 22 - 32 mmol/L 40(H) 35(H) 39(H)  Calcium 8.9 - 10.3 mg/dL 8.9 9.3 9.5    CBC Latest Ref Rng & Units 03/03/2018 03/02/2018 02/27/2018  WBC 4.0 - 10.5 K/uL 5.9 6.0 8.8  Hemoglobin 12.0 - 15.0 g/dL 10.1(L) 10.1(L) 11.1(L)  Hematocrit 36.0 - 46.0 % 33.1(L) 34.4(L) 36.9  Platelets 150  - 400 K/uL 267 276 318    CXR:    I have personally reviewed all chest radiographs reported above including CXRs and CT chest unless otherwise indicated  IMPRESSION:     ICD-10-CM   1. Chronic respiratory failure with hypoxia and hypercapnia (HCC) J96.11    J96.12   2. COPD, severe ([place]) J44.9      Chronic hypoxemic and hypercarbic respiratory failure is due to severe COPD  There is strong evidence in the medical literature demonstrating that nocturnal support with noninvasive ventilation reduces hospitalization frequency and improves overall quality of life.  This patient requires therapy throughout a 24-hour day and failure to provide this therapy could lead to recurrent hospitalization and even death.  BiPAP  was considered but is deemed inferior to NIV  PLAN:  Continue nebulized budesonide twice a day Continue DuoNeb 3-4 times per day Continue oxygen therapy at 2 LPM is close to 24 hours/day as possible Continue BiPAP with sleep  Follow-up in 2 months  Upon follow-up, we will discuss the possibility of enrollment in pulmonary rehabilitation program and we will consider pulmonary function tests  Upon follow-up, we should also have further discussions regarding goals of care.  David Simonds, MD PCCM service Mobile [phone] Pager [phone] 03/08/2018 1:09 PM  

## 2018-03-08 NOTE — Patient Instructions (Signed)
Continue nebulized budesonide twice a day Continue DuoNeb 3-4 times per day Continue oxygen therapy at 2 LPM is close to 24 hours/day as possible Continue BiPAP with sleep at night Follow-up in 2 months  Upon follow-up, we will discuss the possibility of enrollment in pulmonary rehabilitation program and we will consider pulmonary function tests

## 2018-03-16 ENCOUNTER — Telehealth: Payer: Self-pay | Admitting: Pulmonary Disease

## 2018-03-16 DIAGNOSIS — J9611 Chronic respiratory failure with hypoxia: Secondary | ICD-10-CM

## 2018-03-16 NOTE — Telephone Encounter (Signed)
Patient does not have her own bipap. She is on the facilities bi-pap. Pt qualified for NIV per Melissa based on labs. She will be discharged from facility without bi-pap. Which way would you like to proceed. ? Order sleep study or try for NIV

## 2018-03-16 NOTE — Addendum Note (Signed)
Addended by: Stephanie Coup on: 03/16/2018 01:48 PM   Modules accepted: Orders

## 2018-03-16 NOTE — Telephone Encounter (Signed)
Pt is on skilled facility BiPAP and does not have her own. Please call to discuss.

## 2018-03-16 NOTE — Telephone Encounter (Signed)
She has met criteria for NIV so let's try to get that set up  Thanks  Waunita Schooner

## 2018-03-17 ENCOUNTER — Telehealth: Payer: Self-pay | Admitting: Pulmonary Disease

## 2018-03-17 NOTE — Telephone Encounter (Signed)
DME referral entered Lenna Sciara will give feedback on what else may be needed.

## 2018-03-17 NOTE — Addendum Note (Signed)
Addended by: Stephanie Coup on: 03/17/2018 09:02 AM   Modules accepted: Orders

## 2018-03-17 NOTE — Telephone Encounter (Signed)
Form received from [facility] for NIV and placed in DS folder. Nothing further needed.

## 2018-03-17 NOTE — Telephone Encounter (Signed)
Please call regarding information about NIV.

## 2018-03-18 ENCOUNTER — Telehealth: Payer: Self-pay | Admitting: *Deleted

## 2018-03-18 ENCOUNTER — Telehealth: Payer: Self-pay

## 2018-03-18 DIAGNOSIS — J9611 Chronic respiratory failure with hypoxia: Secondary | ICD-10-CM

## 2018-03-18 DIAGNOSIS — J449 Chronic obstructive pulmonary disease, unspecified: Secondary | ICD-10-CM

## 2018-03-18 NOTE — Telephone Encounter (Signed)
Belinda Jenkins from [facility] calling needing a referral for [facility] for the BIPAP   Please call back

## 2018-03-18 NOTE — Telephone Encounter (Signed)
Spoke with patient's daughter. Pt is supposed to discharge from [facility] on 03/22/18. We can either order sleep study or chart note will need addendum and NIV order form completed. I have ordered sleep study if you want please.

## 2018-03-18 NOTE — Addendum Note (Signed)
Addended by: Stephanie Coup on: 03/18/2018 04:32 PM   Modules accepted: Orders

## 2018-03-18 NOTE — Telephone Encounter (Signed)
Daughter returning call.  Please try after 4 .

## 2018-03-18 NOTE — Telephone Encounter (Signed)
LM for Beth and patient's daughter to call back.

## 2018-03-23 NOTE — Telephone Encounter (Signed)
NIV order submitted to [facility]. Nothing further needed.

## 2018-03-23 NOTE — Telephone Encounter (Signed)
Spoke to [employer] [facility]). She advised that she went out to pt home to set patient up on bi-pap therapy and they had qualifying issue. Called Corene Cornea [facility]) to find out next/best option. He says he received an email stating patient's home did not have grounded sockets and that the spouse did not want them in the home more than once. After being advised that they would [facility]) respiratory therapist would need to come in home for check spouse declined NIV. Order has been placed for Bi-pap therapy.

## 2018-03-23 NOTE — Addendum Note (Signed)
Addended by: Stephanie Coup on: 03/23/2018 04:09 PM   Modules accepted: Orders

## 2018-03-23 NOTE — Telephone Encounter (Signed)
Crystal from [facility] calling States that patient would need a BiPAP instead of a ventilator  Would like orders sent today if possible  [phone] to contact [place] - fax number for [facility] Transferred to [facility]

## 2018-03-25 ENCOUNTER — Ambulatory Visit: Payer: Medicare Other | Attending: Internal Medicine

## 2018-03-25 DIAGNOSIS — G4733 Obstructive sleep apnea (adult) (pediatric): Secondary | ICD-10-CM | POA: Diagnosis present

## 2018-03-25 DIAGNOSIS — G4736 Sleep related hypoventilation in conditions classified elsewhere: Secondary | ICD-10-CM | POA: Diagnosis not present

## 2018-03-26 ENCOUNTER — Telehealth: Payer: Self-pay | Admitting: Pulmonary Disease

## 2018-03-26 DIAGNOSIS — G4733 Obstructive sleep apnea (adult) (pediatric): Secondary | ICD-10-CM

## 2018-03-26 NOTE — Telephone Encounter (Signed)
URGENT.Marland Kitchen PLEASE READ SLEEP STUDY. NEED BIPAP ORDERS.

## 2018-03-26 NOTE — Telephone Encounter (Signed)
Please call regarding sleep study results.

## 2018-03-29 DIAGNOSIS — G4733 Obstructive sleep apnea (adult) (pediatric): Secondary | ICD-10-CM | POA: Diagnosis not present

## 2018-03-29 NOTE — Telephone Encounter (Signed)
Sleep study received and being faxed to [facility].

## 2018-03-29 NOTE — Telephone Encounter (Signed)
Left detailed message results of sleep study. Orders placed for vpap. Pending physician signature.

## 2018-03-29 NOTE — Telephone Encounter (Signed)
Study has been read.  recommend VPAP-auto with minimum EPAP 5, maximum IPAP 20, Pressure support 5.

## 2018-03-29 NOTE — Telephone Encounter (Signed)
Duplicate message. 

## 2018-03-29 NOTE — Telephone Encounter (Signed)
Pt called to get results from sleep study, stated that it was supposed to be a "rush". CB [phone]

## 2018-03-30 ENCOUNTER — Telehealth: Payer: Self-pay | Admitting: Pulmonary Disease

## 2018-03-30 NOTE — Telephone Encounter (Signed)
Pt advised that orders were placed and she may have to go back in for a titration study. Will f/u on 03/31/18

## 2018-03-31 NOTE — Telephone Encounter (Signed)
Pt's daughter [name] called back to get f/u.  CB#- [phone]

## 2018-03-31 NOTE — Telephone Encounter (Signed)
Spoke with [name] and advised we are still awaiting feedback from [place] on VPAP approval. We will touch base again tomorrow.

## 2018-04-02 NOTE — Telephone Encounter (Signed)
Called [facility] [phone] to find out status of VPAP. Order still pending. Spoke to [place] and she states it can take 3-4 business days but they are working on it.

## 2018-04-05 NOTE — Telephone Encounter (Signed)
04/05/18 Called and spoke with [place] at [facility] in [place] and she stated that order for VPAP has been approved and has been sent to BiPap approval team which usually takes 2 business days.  Due to the up coming holiday this week, pt should receive a call by Friday 04/09/18. [place] that this was a stat order and that patient needs this equipment ASAP. Rhonda J Cobb

## 2018-04-12 NOTE — Telephone Encounter (Signed)
Belinda Jenkins is due to be set up on BiPap tomorrow 04/13/18.  Everything has been approved.

## 2018-07-05 ENCOUNTER — Telehealth: Payer: Self-pay | Admitting: Pulmonary Disease

## 2018-07-05 ENCOUNTER — Telehealth: Payer: Self-pay

## 2018-07-05 NOTE — Telephone Encounter (Signed)
Patient called back and wanted to keep her apt for Thursday. She declined phone visit. Completed COVID-19 screening.  Have you recently traveled any where out of the local area in the last 2 weeks? Went to [place] in [place]  Have you been in close contact with a person diagnosed with COVID-19 within the last 2 weeks? no  Do you currently have any fever, cough, or shortness of breath? Cough for 2 weeks  Okay to proceed with visit.

## 2018-07-05 NOTE — Telephone Encounter (Signed)
Spoke to patient, she stated at this time she is doing okay and can r/s apt. We r/s to June 29th. She is aware to call our office if anything changes.

## 2018-07-08 ENCOUNTER — Other Ambulatory Visit: Payer: Self-pay

## 2018-07-08 ENCOUNTER — Encounter: Payer: Self-pay | Admitting: Pulmonary Disease

## 2018-07-08 ENCOUNTER — Ambulatory Visit (INDEPENDENT_AMBULATORY_CARE_PROVIDER_SITE_OTHER): Payer: Medicare Other | Admitting: Pulmonary Disease

## 2018-07-08 ENCOUNTER — Ambulatory Visit: Payer: Medicare Other | Admitting: Pulmonary Disease

## 2018-07-08 VITALS — BP 134/76 | HR 86 | Ht 62.0 in | Wt 135.0 lb

## 2018-07-08 DIAGNOSIS — J9611 Chronic respiratory failure with hypoxia: Secondary | ICD-10-CM | POA: Diagnosis not present

## 2018-07-08 DIAGNOSIS — Z7189 Other specified counseling: Secondary | ICD-10-CM

## 2018-07-08 DIAGNOSIS — J449 Chronic obstructive pulmonary disease, unspecified: Secondary | ICD-10-CM | POA: Diagnosis not present

## 2018-07-08 NOTE — Patient Instructions (Signed)
Continue nebulized budesonide (Pulmicort) twice a day Continue DuoNeb nebulized 3-4 times per day Continue oxygen therapy is close to 24 hours/day as possible Continue BiPAP with sleep Follow-up in 4 months.  Call sooner if needed

## 2018-07-09 NOTE — Progress Notes (Signed)
PULMONARY OFFICE FOLLOW UP NOTE  Requesting MD/Service: Hospitalist Date of initial consultation: 02/08/18 (in hospital) Reason for consultation: Ventilator dependent respiratory failure  PT PROFILE: 68 y.o. female former smoker with severe COPD @ baseline (FEV1 0.48 L in 2016), O2 dependent since approx 2010 brought to [facility] ED with altered mental status, hypercarbic on ABG, intubated for agonal respirations. In ED, suffered hypotension and brief pulselessness requiring 2 minutes chest compression.  Received 0.5 mg epinephrine. At baseline, struggles with ADLs but cognition fully intact.    Previously followed by [facility] pulmonary  DATA: 11/27/14 PFTs [facility]): FVC: 1.31 L (48 %pred), FEV1: 0.48 L (22 %pred), FEV1/FVC: 46%   INTERVAL: Last visit 03/12/18.  No major pulmonary events in the interim  SUBJ:  This is a scheduled follow-up.  There has been no major change and no major pulmonary events since last visit.  She remains markedly limited by exertional dyspnea.  She is able to ambulate no more than 50 m.  She continues to have minimal cough no significant sputum production.  She remains compliant with nebulized budesonide twice a day and nebulized DuoNeb 3-4 times per day.  She is wearing her oxygen compliantly.  She is using BiPAP with sleep compliantly. She denies fever, chest pain, hemoptysis, lower extremity edema, calf tenderness.   Vitals:   07/08/18 1320 07/08/18 1325  BP:  134/76  Pulse:  86  SpO2:  94%  Weight: 135 lb (61.2 kg)   Height: 5\' 2"  (1.575 m)   2 LPM [place]  EXAM:  Gen: NAD at rest HEENT: NCAT, sclerae white Neck: No JVD Lungs: breath sounds markedly diminished without wheezes or other adventitious sounds Cardiovascular: RRR, no murmurs Abdomen: Soft, nontender, normal BS Ext: without clubbing, cyanosis, edema Neuro: grossly intact Skin: Limited exam, no lesions noted   DATA:   BMP Latest Ref Rng & Units 03/03/2018 03/02/2018 02/27/2018  Glucose 70 - 99 mg/dL  122(H) 225(H) 209(H)  BUN 8 - 23 mg/dL 42(H) 46(H) 29(H)  Creatinine 0.44 - 1.00 mg/dL 0.47 0.51 0.48  Sodium 135 - 145 mmol/L 142 140 137  Potassium 3.5 - 5.1 mmol/L 3.9 3.9 4.1  Chloride 98 - 111 mmol/L 97(L) 93(L) 89(L)  CO2 22 - 32 mmol/L 40(H) 35(H) 39(H)  Calcium 8.9 - 10.3 mg/dL 8.9 9.3 9.5    CBC Latest Ref Rng & Units 03/03/2018 03/02/2018 02/27/2018  WBC 4.0 - 10.5 K/uL 5.9 6.0 8.8  Hemoglobin 12.0 - 15.0 g/dL 10.1(L) 10.1(L) 11.1(L)  Hematocrit 36.0 - 46.0 % 33.1(L) 34.4(L) 36.9  Platelets 150 - 400 K/uL 267 276 318    CXR: No new film  I have personally reviewed all chest radiographs reported above including CXRs and CT chest unless otherwise indicated  IMPRESSION:     ICD-10-CM   1. COPD, very severe ([place]) J44.9   2. Chronic hypoxemic respiratory failure (HCC) J96.11   3. Goals of care, counseling/discussion Z71.89    I took approximately 10 minutes to discuss goals of care with her and her daughter.  We discussed which interventions should be considered in further discussions of advanced directives.  Since she had such a "quick turnaround" in November, it would not be unreasonable for her to again undergo a very brief stay on mechanical ventilation.  However, I encouraged that she make it clear that she would not wish to undergo prolonged mechanical ventilation to include tracheostomy tube placement etc.  I encouraged her to discuss these matters openly with her family members.  PLAN:  Continue nebulized budesonide (Pulmicort) twice a day Continue DuoNeb nebulized 3-4 times per day Continue oxygen therapy is close to 24 hours/day as possible Continue BiPAP with sleep Follow-up in 4 months.  Call sooner if needed   Merton Border, MD PCCM service Mobile [phone] Pager [phone] 07/09/2018 1:13 PM

## 2018-08-20 ENCOUNTER — Other Ambulatory Visit: Payer: Self-pay | Admitting: Pulmonary Disease

## 2018-08-20 ENCOUNTER — Telehealth: Payer: Self-pay | Admitting: Pulmonary Disease

## 2018-08-20 MED ORDER — IPRATROPIUM-ALBUTEROL 0.5-2.5 (3) MG/3ML IN SOLN: 3.0000 mL | Freq: Four times a day (QID) | RESPIRATORY_TRACT | 1 refills | Status: DC

## 2018-08-20 NOTE — Telephone Encounter (Signed)
ATC pt- unable to leave voicemail, due to mailbox being full.  Rx for duoneb has been sent to preferred pharmacy, as pt was instructed to continue this medication at last OV.

## 2018-08-20 NOTE — Telephone Encounter (Signed)
Received Rx request from [facility] for atrovent neb solution.  Spoke to pt and confirmed that duoneb is needed and not Atrovent.  Rx for duoneb has been sent to preferred pharmacy. Pt is aware and voiced her understanding.  Nothing further is needed.

## 2018-10-11 ENCOUNTER — Ambulatory Visit: Payer: Medicare Other | Admitting: Pulmonary Disease

## 2018-11-08 ENCOUNTER — Encounter: Payer: Self-pay | Admitting: Pulmonary Disease

## 2018-11-08 ENCOUNTER — Ambulatory Visit (INDEPENDENT_AMBULATORY_CARE_PROVIDER_SITE_OTHER): Payer: Medicare Other | Admitting: Pulmonary Disease

## 2018-11-08 DIAGNOSIS — J9611 Chronic respiratory failure with hypoxia: Secondary | ICD-10-CM | POA: Diagnosis not present

## 2018-11-08 DIAGNOSIS — J449 Chronic obstructive pulmonary disease, unspecified: Secondary | ICD-10-CM | POA: Diagnosis not present

## 2018-11-08 NOTE — Progress Notes (Signed)
PULMONARY OFFICE FOLLOW UP NOTE  Requesting MD/Service: Hospitalist Date of initial consultation: 02/08/18 (in hospital) Reason for consultation: Ventilator dependent respiratory failure  PT PROFILE: 68 y.o. female former smoker with severe COPD @ baseline (FEV1 0.48 L in 2016), O2 dependent since approx 2010 brought to [facility] ED with altered mental status, hypercarbic on ABG, intubated for agonal respirations. In ED, suffered hypotension and brief pulselessness requiring 2 minutes chest compression.  Received 0.5 mg epinephrine. At baseline, struggles with ADLs but cognition fully intact.    Previously followed by [facility] pulmonary  DATA: 11/27/14 PFTs [facility]): FVC: 1.31 L (48 %pred), FEV1: 0.48 L (22 %pred), FEV1/FVC: 46%  Virtual Visit via Telephone Note I connected with Belinda Jenkins on 11/08/18 at  1:30 PM EDT by telephone and verified that I am speaking with the correct person using two identifiers. I discussed the limitations, risks, security and privacy concerns of performing an evaluation and management service by telephone and the availability of in person appointments. I also discussed with the patient that there may be a patient responsible charge related to this service. The patient expressed understanding and agreed to proceed.    INTERVAL: Last visit 07/08/2018.  No major pulmonary events in the interim  SUBJ:  This is a scheduled follow-up which was performed remotely (via telephone) due to the coronavirus pandemic.  She has had no major problems and no significant change in her overall respiratory status since last visit.  She remains on nebulized budesonide twice a day and DuoNeb 3-4 times per day.  She tends to use it 4 times per day on most days.  She never uses it more than 4 times per day.  She is keeping herself protected from the coronavirus and staying mostly indoors in her home.  She has no new complaints.  She denies CP, fever, purulent sputum, hemoptysis, LE edema and  calf tenderness.  OBJ: There were no vitals filed for this visit.   EXAM:  Due to the remote nature of this encounter, no physical examination could be performed  DATA:   BMP Latest Ref Rng & Units 03/03/2018 03/02/2018 02/27/2018  Glucose 70 - 99 mg/dL 122(H) 225(H) 209(H)  BUN 8 - 23 mg/dL 42(H) 46(H) 29(H)  Creatinine 0.44 - 1.00 mg/dL 0.47 0.51 0.48  Sodium 135 - 145 mmol/L 142 140 137  Potassium 3.5 - 5.1 mmol/L 3.9 3.9 4.1  Chloride 98 - 111 mmol/L 97(L) 93(L) 89(L)  CO2 22 - 32 mmol/L 40(H) 35(H) 39(H)  Calcium 8.9 - 10.3 mg/dL 8.9 9.3 9.5    CBC Latest Ref Rng & Units 03/03/2018 03/02/2018 02/27/2018  WBC 4.0 - 10.5 K/uL 5.9 6.0 8.8  Hemoglobin 12.0 - 15.0 g/dL 10.1(L) 10.1(L) 11.1(L)  Hematocrit 36.0 - 46.0 % 33.1(L) 34.4(L) 36.9  Platelets 150 - 400 K/uL 267 276 318    CXR: No new film  I have personally reviewed all chest radiographs reported above including CXRs and CT chest unless otherwise indicated  IMPRESSION:     ICD-10-CM   1. COPD, very severe ([place])  J44.9   2. Chronic hypoxemic respiratory failure (HCC)  J96.11      PLAN:  Continue nebulized budesonide (Pulmicort) twice a day Continue DuoNeb 3-4 times per day Continue oxygen therapy as close to 24 hours/day as possible Continue BiPAP with sleep I encouraged her to get her influenza vaccination this year when available Follow-up in 4-6 months with Dr. Mortimer Fries.  Call sooner if needed.   Merton Border, MD  PCCM service Mobile [phone] Pager [phone] 11/08/2018 11:51 AM

## 2018-12-03 ENCOUNTER — Telehealth: Payer: Self-pay | Admitting: Pulmonary Disease

## 2018-12-03 DIAGNOSIS — G4733 Obstructive sleep apnea (adult) (pediatric): Secondary | ICD-10-CM

## 2018-12-03 NOTE — Telephone Encounter (Signed)
Pt called in requesting bipap supplies.  Order has been placed to [facility]. Nothing further is needed.

## 2018-12-09 ENCOUNTER — Telehealth: Payer: Self-pay | Admitting: Pulmonary Disease

## 2018-12-09 DIAGNOSIS — G4733 Obstructive sleep apnea (adult) (pediatric): Secondary | ICD-10-CM

## 2018-12-10 NOTE — Telephone Encounter (Signed)
Called and spoke to pt, who is requesting order for new bipap mask. Her current mask is broken.  Order was placed on 12/03/2018 to [facility] to provide pt with supplies. [facility] has not contacted pt as of yet.  Suanne Marker, can you help with this?

## 2018-12-10 NOTE — Telephone Encounter (Signed)
Called and spoke with Belinda Jenkins at [facility] in [place]. Order was sent on 12/06/2018.  Pt uses [facility] out of [place] sent message to [place] in [place], however, patient has not received mask yet and hasn't heard anything from them.  Belinda Jenkins will reach out to [facility] in [place] and let them know that we are calling to check on the status of order.  Belinda Jenkins will request that someone from the [facility] in [place] will contact me directly at [phone]. Rhonda J Cobb

## 2018-12-13 ENCOUNTER — Telehealth: Payer: Self-pay | Admitting: Pulmonary Disease

## 2018-12-13 NOTE — Telephone Encounter (Signed)
Left message for pt.  Contacted [facility] in [place] and was advised to call [facility] at [phone]. I have attempted to contact [place] and was placed on hold for >31min.

## 2018-12-13 NOTE — Telephone Encounter (Signed)
Per Ashly at [facility], since patient did not have a BiPap study the only way we can obtain BiPap Supplies is for patient to be send patient back to lab for BiPap titration study or since patient is a severe hypoventilation patient and patient and retains C02 we can order an "ONO on prescribed liter flow of 02. If patient desats on 02 along with ABG report pt will qualify for BiPap.  Please advise.    Thanks, Catha Gosselin

## 2018-12-13 NOTE — Telephone Encounter (Signed)
Please see duplicate phone note dated 12/13/2018

## 2018-12-14 NOTE — Telephone Encounter (Signed)
ATC pt- unable to leave voicemail due ot mailbox being full.

## 2018-12-15 NOTE — Telephone Encounter (Addendum)
Bipap titration has been ordered.  Pt is aware and voiced her understanding.  Nothing further is needed.

## 2018-12-15 NOTE — Telephone Encounter (Signed)
After speaking with Dr. [facility] Garnet he states to order the BiPap Titration Study. Message sent to nurse to place order and contact patient of the above. Rhonda J Cobb

## 2018-12-15 NOTE — Telephone Encounter (Signed)
It sounds as if she is simply asking for a replacement mask... There is no difference between a "BiPAP mask" and "CPAP mask". I am not sure why [employer] is asking for repeat study to simply replace a mask. I think we should just be able to request a new mask.  Belinda Jenkins

## 2018-12-21 ENCOUNTER — Other Ambulatory Visit: Payer: Self-pay | Admitting: Pulmonary Disease

## 2018-12-22 ENCOUNTER — Other Ambulatory Visit: Payer: Self-pay | Admitting: Pulmonary Disease

## 2018-12-23 ENCOUNTER — Other Ambulatory Visit: Payer: Self-pay

## 2018-12-23 MED ORDER — IPRATROPIUM-ALBUTEROL 0.5-2.5 (3) MG/3ML IN SOLN: RESPIRATORY_TRACT | 1 refills | Status: DC

## 2018-12-24 ENCOUNTER — Other Ambulatory Visit: Payer: Self-pay

## 2018-12-24 ENCOUNTER — Telehealth: Payer: Self-pay | Admitting: Pulmonary Disease

## 2018-12-24 MED ORDER — BUDESONIDE 0.5 MG/2ML IN SUSP: 0.5000 mg | Freq: Two times a day (BID) | RESPIRATORY_TRACT | 12 refills | Status: AC

## 2018-12-24 NOTE — Telephone Encounter (Signed)
error 

## 2019-03-24 ENCOUNTER — Telehealth: Payer: Self-pay

## 2019-03-24 NOTE — Telephone Encounter (Signed)
Spoke to pt, to relay date/time of covid test prior to sleep study.  Pt stated that she called and canceled sleep study. Pt will call back when ready to reschedule.

## 2019-03-28 ENCOUNTER — Other Ambulatory Visit: Payer: Medicare Other

## 2019-05-19 ENCOUNTER — Other Ambulatory Visit: Payer: Medicare Other

## 2019-06-05 ENCOUNTER — Ambulatory Visit: Payer: Medicare Other | Attending: Internal Medicine

## 2019-06-05 DIAGNOSIS — Z23 Encounter for immunization: Secondary | ICD-10-CM | POA: Insufficient documentation

## 2019-06-05 NOTE — Progress Notes (Signed)
   Covid-19 Vaccination Clinic  Name:  Belinda Jenkins    MRN: [id number] DOB: 1950-10-29  06/05/2019  [name] was observed post Covid-19 immunization for 15 minutes without incidence. She was provided with Vaccine Information Sheet and instruction to access the V-Safe system.   [name] was instructed to call 911 with any severe reactions post vaccine: Marland Kitchen Difficulty breathing  . Swelling of your face and throat  . A fast heartbeat  . A bad rash all over your body  . Dizziness and weakness    Immunizations Administered    Name Date Dose VIS Date Route   Pfizer COVID-19 Vaccine 06/05/2019  9:51 AM 0.3 mL 03/25/2019 Intramuscular   Manufacturer: [place]   Lot: [id number]   [place]: [id number]

## 2019-06-28 ENCOUNTER — Ambulatory Visit: Payer: Medicare Other | Attending: Internal Medicine

## 2019-06-28 DIAGNOSIS — Z23 Encounter for immunization: Secondary | ICD-10-CM

## 2019-06-28 NOTE — Progress Notes (Signed)
   Covid-19 Vaccination Clinic  Name:  Belinda Jenkins    MRN: [id number] DOB: 09-03-1950  06/28/2019  [name] was observed post Covid-19 immunization for 15 minutes without incident. She was provided with Vaccine Information Sheet and instruction to access the V-Safe system.   [name] was instructed to call 911 with any severe reactions post vaccine: Marland Kitchen Difficulty breathing  . Swelling of face and throat  . A fast heartbeat  . A bad rash all over body  . Dizziness and weakness   Immunizations Administered    Name Date Dose VIS Date Route   Pfizer COVID-19 Vaccine 06/28/2019  9:00 AM 0.3 mL 03/25/2019 Intramuscular   Manufacturer: [place]   Lot: [id number]   [place]: [id number]

## 2019-08-19 ENCOUNTER — Emergency Department: Payer: Medicare Other

## 2019-08-19 ENCOUNTER — Encounter: Payer: Self-pay | Admitting: Emergency Medicine

## 2019-08-19 ENCOUNTER — Other Ambulatory Visit: Payer: Self-pay

## 2019-08-19 ENCOUNTER — Inpatient Hospital Stay
Admission: EM | Admit: 2019-08-19 | Discharge: 2019-08-22 | DRG: 189 | Disposition: A | Discharge status: Home / Self Care | Payer: Medicare Other | Attending: Internal Medicine | Admitting: Internal Medicine

## 2019-08-19 DIAGNOSIS — R778 Other specified abnormalities of plasma proteins: Secondary | ICD-10-CM | POA: Insufficient documentation

## 2019-08-19 DIAGNOSIS — Z888 Allergy status to other drugs, medicaments and biological substances status: Secondary | ICD-10-CM

## 2019-08-19 DIAGNOSIS — I10 Essential (primary) hypertension: Secondary | ICD-10-CM | POA: Diagnosis present

## 2019-08-19 DIAGNOSIS — Z79899 Other long term (current) drug therapy: Secondary | ICD-10-CM

## 2019-08-19 DIAGNOSIS — I248 Other forms of acute ischemic heart disease: Secondary | ICD-10-CM | POA: Diagnosis present

## 2019-08-19 DIAGNOSIS — Z20822 Contact with and (suspected) exposure to covid-19: Secondary | ICD-10-CM | POA: Diagnosis present

## 2019-08-19 DIAGNOSIS — J9622 Acute and chronic respiratory failure with hypercapnia: Principal | ICD-10-CM | POA: Diagnosis present

## 2019-08-19 DIAGNOSIS — J449 Chronic obstructive pulmonary disease, unspecified: Secondary | ICD-10-CM | POA: Diagnosis present

## 2019-08-19 DIAGNOSIS — Z90710 Acquired absence of both cervix and uterus: Secondary | ICD-10-CM

## 2019-08-19 DIAGNOSIS — J9621 Acute and chronic respiratory failure with hypoxia: Secondary | ICD-10-CM | POA: Diagnosis not present

## 2019-08-19 DIAGNOSIS — Z9842 Cataract extraction status, left eye: Secondary | ICD-10-CM

## 2019-08-19 DIAGNOSIS — Z9841 Cataract extraction status, right eye: Secondary | ICD-10-CM

## 2019-08-19 DIAGNOSIS — G92 Toxic encephalopathy: Secondary | ICD-10-CM | POA: Diagnosis present

## 2019-08-19 DIAGNOSIS — E872 Acidosis: Secondary | ICD-10-CM | POA: Diagnosis present

## 2019-08-19 DIAGNOSIS — Z87891 Personal history of nicotine dependence: Secondary | ICD-10-CM

## 2019-08-19 DIAGNOSIS — Z9981 Dependence on supplemental oxygen: Secondary | ICD-10-CM

## 2019-08-19 DIAGNOSIS — Z7951 Long term (current) use of inhaled steroids: Secondary | ICD-10-CM

## 2019-08-19 DIAGNOSIS — Z961 Presence of intraocular lens: Secondary | ICD-10-CM | POA: Diagnosis present

## 2019-08-19 DIAGNOSIS — Z885 Allergy status to narcotic agent status: Secondary | ICD-10-CM

## 2019-08-19 DIAGNOSIS — Z882 Allergy status to sulfonamides status: Secondary | ICD-10-CM

## 2019-08-19 DIAGNOSIS — G912 (Idiopathic) normal pressure hydrocephalus: Secondary | ICD-10-CM | POA: Diagnosis present

## 2019-08-19 DIAGNOSIS — Z7983 Long term (current) use of bisphosphonates: Secondary | ICD-10-CM

## 2019-08-19 DIAGNOSIS — E119 Type 2 diabetes mellitus without complications: Secondary | ICD-10-CM | POA: Diagnosis present

## 2019-08-19 DIAGNOSIS — Z7984 Long term (current) use of oral hypoglycemic drugs: Secondary | ICD-10-CM

## 2019-08-19 LAB — CBC WITH DIFFERENTIAL/PLATELET
Abs Immature Granulocytes: 0.11 10*3/uL — ABNORMAL HIGH (ref 0.00–0.07)
Basophils Absolute: 0 10*3/uL (ref 0.0–0.1)
Basophils Relative: 0 %
Eosinophils Absolute: 0.1 10*3/uL (ref 0.0–0.5)
Eosinophils Relative: 1 %
HCT: 44.1 % (ref 36.0–46.0)
Hemoglobin: 13.3 g/dL (ref 12.0–15.0)
Immature Granulocytes: 1 %
Lymphocytes Relative: 3 %
Lymphs Abs: 0.3 10*3/uL — ABNORMAL LOW (ref 0.7–4.0)
MCH: 27.3 pg (ref 26.0–34.0)
MCHC: 30.2 g/dL (ref 30.0–36.0)
MCV: 90.4 fL (ref 80.0–100.0)
Monocytes Absolute: 0.7 10*3/uL (ref 0.1–1.0)
Monocytes Relative: 7 %
Neutro Abs: 8.4 10*3/uL — ABNORMAL HIGH (ref 1.7–7.7)
Neutrophils Relative %: 88 %
Platelets: 304 10*3/uL (ref 150–400)
RBC: 4.88 MIL/uL (ref 3.87–5.11)
RDW: 13.2 % (ref 11.5–15.5)
WBC: 9.7 10*3/uL (ref 4.0–10.5)
nRBC: 0 % (ref 0.0–0.2)

## 2019-08-19 LAB — HIV ANTIBODY (ROUTINE TESTING W REFLEX): HIV Screen 4th Generation wRfx: NONREACTIVE

## 2019-08-19 LAB — BLOOD GAS, VENOUS
Acid-Base Excess: 2.6 mmol/L — ABNORMAL HIGH (ref 0.0–2.0)
Bicarbonate: 34 mmol/L — ABNORMAL HIGH (ref 20.0–28.0)
O2 Saturation: 60.2 %
Patient temperature: 37
pCO2, Ven: 91 mmHg (ref 44.0–60.0)
pH, Ven: 7.18 — CL (ref 7.250–7.430)
pO2, Ven: 40 mmHg (ref 32.0–45.0)

## 2019-08-19 LAB — COMPREHENSIVE METABOLIC PANEL
ALT: 13 U/L (ref 0–44)
AST: 24 U/L (ref 15–41)
Albumin: 5 g/dL (ref 3.5–5.0)
Alkaline Phosphatase: 53 U/L (ref 38–126)
Anion gap: 17 — ABNORMAL HIGH (ref 5–15)
BUN: 24 mg/dL — ABNORMAL HIGH (ref 8–23)
CO2: 30 mmol/L (ref 22–32)
Calcium: 9.5 mg/dL (ref 8.9–10.3)
Chloride: 86 mmol/L — ABNORMAL LOW (ref 98–111)
Creatinine, Ser: 0.68 mg/dL (ref 0.44–1.00)
GFR calc Af Amer: >60 mL/min (ref >60)
GFR calc non Af Amer: >60 mL/min (ref >60)
Glucose, Bld: 186 mg/dL — ABNORMAL HIGH (ref 70–99)
Potassium: 4.9 mmol/L (ref 3.5–5.1)
Sodium: 133 mmol/L — ABNORMAL LOW (ref 135–145)
Total Bilirubin: 1.8 mg/dL — ABNORMAL HIGH (ref 0.3–1.2)
Total Protein: 8.3 g/dL — ABNORMAL HIGH (ref 6.5–8.1)

## 2019-08-19 LAB — TROPONIN I (HIGH SENSITIVITY)
Troponin I (High Sensitivity): 217 ng/L (ref <18)
Troponin I (High Sensitivity): 233 ng/L (ref <18)
Troponin I (High Sensitivity): 209 ng/L (ref <18)

## 2019-08-19 LAB — HEMOGLOBIN A1C
Hgb A1c MFr Bld: 6.4 % — ABNORMAL HIGH (ref 4.8–5.6)
Mean Plasma Glucose: 136.98 mg/dL

## 2019-08-19 LAB — RESPIRATORY PANEL BY RT PCR (FLU A&B, COVID)
Influenza A by PCR: NEGATIVE
Influenza B by PCR: NEGATIVE
SARS Coronavirus 2 by RT PCR: NEGATIVE

## 2019-08-19 MED ORDER — IPRATROPIUM-ALBUTEROL 0.5-2.5 (3) MG/3ML IN SOLN: 3.0000 mL | RESPIRATORY_TRACT | Status: DC | PRN

## 2019-08-19 MED ORDER — ONDANSETRON HCL 4 MG PO TABS: 4.0000 mg | ORAL_TABLET | Freq: Four times a day (QID) | ORAL | Status: DC | PRN

## 2019-08-19 MED ORDER — METOPROLOL TARTRATE 5 MG/5ML IV SOLN
5.0000 mg | INTRAVENOUS | Status: DC | PRN
  Administered 2019-08-19: 5 mg via INTRAVENOUS
  Filled 2019-08-19 (×2): qty 5

## 2019-08-19 MED ORDER — METOPROLOL TARTRATE 25 MG PO TABS
12.5000 mg | ORAL_TABLET | Freq: Two times a day (BID) | ORAL | Status: DC
  Administered 2019-08-20 – 2019-08-21 (×3): 12.5 mg via ORAL
  Filled 2019-08-19 (×3): qty 1

## 2019-08-19 MED ORDER — ENOXAPARIN SODIUM 40 MG/0.4ML ~~LOC~~ SOLN
40.0000 mg | SUBCUTANEOUS | Status: DC
  Administered 2019-08-19 – 2019-08-21 (×3): 40 mg via SUBCUTANEOUS
  Filled 2019-08-19 (×3): qty 0.4

## 2019-08-19 MED ORDER — INSULIN ASPART 100 UNIT/ML ~~LOC~~ SOLN
0.0000 [IU] | SUBCUTANEOUS | Status: DC
  Administered 2019-08-19 – 2019-08-20 (×3): 1 [IU] via SUBCUTANEOUS
  Administered 2019-08-20: 13:00:00 2 [IU] via SUBCUTANEOUS
  Administered 2019-08-21: 1 [IU] via SUBCUTANEOUS
  Administered 2019-08-21 (×2): 2 [IU] via SUBCUTANEOUS
  Administered 2019-08-21 – 2019-08-22 (×2): 1 [IU] via SUBCUTANEOUS
  Filled 2019-08-19 (×9): qty 1

## 2019-08-19 MED ORDER — IRBESARTAN 150 MG PO TABS
150.0000 mg | ORAL_TABLET | Freq: Every day | ORAL | Status: DC
  Administered 2019-08-20 – 2019-08-21 (×2): 150 mg via ORAL
  Filled 2019-08-19 (×2): qty 1

## 2019-08-19 MED ORDER — BUDESONIDE 0.5 MG/2ML IN SUSP
0.5000 mg | Freq: Two times a day (BID) | RESPIRATORY_TRACT | Status: DC
  Administered 2019-08-20 – 2019-08-22 (×5): 0.5 mg via RESPIRATORY_TRACT
  Filled 2019-08-19 (×5): qty 2

## 2019-08-19 MED ORDER — SODIUM CHLORIDE 0.9% FLUSH
3.0000 mL | Freq: Two times a day (BID) | INTRAVENOUS | Status: DC
  Administered 2019-08-19 – 2019-08-21 (×5): 3 mL via INTRAVENOUS

## 2019-08-19 MED ORDER — ONDANSETRON HCL 4 MG/2ML IJ SOLN: 4.0000 mg | Freq: Four times a day (QID) | INTRAMUSCULAR | Status: DC | PRN

## 2019-08-19 MED ORDER — LACTATED RINGERS IV SOLN: INTRAVENOUS | Status: DC

## 2019-08-19 MED ORDER — SODIUM CHLORIDE 0.9 % IV SOLN: Freq: Once | INTRAVENOUS | Status: AC

## 2019-08-19 NOTE — ED Triage Notes (Signed)
Pt to ED with daughter who states pt w/ AMS. Per daughter pt has been laying in bed for several days and seems lethargic.

## 2019-08-19 NOTE — ED Provider Notes (Signed)
[facility] Emergency Department Provider Note       Time seen: ----------------------------------------- 4:32 PM on 08/19/2019 -----------------------------------------  Level V caveat: History/ROS limited by altered mental status I have reviewed the triage vital signs and the nursing notes.  HISTORY   Chief Complaint Altered Mental Status   HPI Belinda Jenkins is a 69 y.o. female with a history of COPD, diabetes, hypertension, vertigo who presents to the ED for altered mental status.  Daughter states she been lying in bed for several days and seems lethargic.  Reportedly she has not been using her BiPAP at night as she is directed.  She is on 2-1/2 L of oxygen at baseline and was brought in hypoxic.  Past Medical History:  Diagnosis Date  . COPD (chronic obstructive pulmonary disease) ([place])   . Diabetes mellitus without complication ([place])   . Family history of adverse reaction to anesthesia    sister - slow to wake after 1 procedure  . Hypertension   . Vertigo     Patient Active Problem List   Diagnosis Date Noted  . COPD, very severe ([place]) 11/08/2018  . Chronic hypoxemic respiratory failure ([place]) 11/08/2018  . Hx of colonic polyps   . Benign neoplasm of rectosigmoid junction     Past Surgical History:  Procedure Laterality Date  . ABDOMINAL HYSTERECTOMY    . BACK SURGERY     L5  . BLEPHAROPLASTY Bilateral   . BREAST SURGERY     lumpectomy(x1), milk duct(x1)  . CATARACT EXTRACTION W/ INTRAOCULAR LENS  IMPLANT, BILATERAL    . COLONOSCOPY WITH PROPOFOL N/A 07/14/2016   Procedure: COLONOSCOPY WITH PROPOFOL;  Surgeon: Lucilla Lame, MD;  Location: [place];  Service: Endoscopy;  Laterality: N/A;  Diabetic - oral meds requests arrival around 8 AM  . HIP SURGERY Left   . POLYPECTOMY  07/14/2016   Procedure: POLYPECTOMY;  Surgeon: Lucilla Lame, MD;  Location: [place];  Service: Endoscopy;;     Allergies Sulfamethoxazole-trimethoprim, Morphine, Oxycodone-acetaminophen, Sertraline, and Adhesive [tape]  Social History Social History   Tobacco Use  . Smoking status: Former Smoker    Quit date: 04/15/2003    Years since quitting: 16.3  . Smokeless tobacco: Never Used  Substance Use Topics  . Alcohol use: Yes    Alcohol/week: 1.0 standard drinks    Types: 1 Shots of liquor per week  . Drug use: Never    Review of Systems Reported lethargy and altered mental status, otherwise unknown  All systems negative/normal/unremarkable except as stated in the HPI  ____________________________________________   PHYSICAL EXAM:  VITAL SIGNS: ED Triage Vitals  Enc Vitals Group     BP 08/19/19 1613 (!) 139/98     Pulse Rate 08/19/19 1613 (!) 120     Resp 08/19/19 1613 (!) 22     Temp 08/19/19 1613 97.8 F (36.6 C)     Temp Source 08/19/19 1613 Axillary     SpO2 08/19/19 1613 (!) 85 %     Weight --      Height --      Head Circumference --      Peak Flow --      Pain Score 08/19/19 1616 0     Pain Loc --      Pain Edu? --      Excl. in [place]? --     Constitutional: Alert but disoriented.  Mild to moderate distress Eyes: Conjunctivae are normal. Normal extraocular movements. ENT  Head: Normocephalic and atraumatic.      Nose: No congestion/rhinnorhea.      Mouth/Throat: Mucous membranes are moist.      Neck: No stridor. Cardiovascular: Rapid rate, regular rhythm. No murmurs, rubs, or gallops. Respiratory: Tachypnea with mostly clear breath sounds Gastrointestinal: Soft and nontender. Normal bowel sounds Musculoskeletal: Nontender with normal range of motion in extremities. No lower extremity tenderness nor edema. Neurologic:  Normal speech and language. No gross focal neurologic deficits are appreciated.  Skin:  Skin is warm, dry and intact.  Pallor is noted Psychiatric: Flat affect ____________________________________________  EKG: Interpreted by me.  Sinus  tachycardia with PVCs, right bundle branch block, T wave abnormality, normal QT  ____________________________________________  ED COURSE:  As part of my medical decision making, I reviewed the following data within the [place] History obtained from family if available, nursing notes, old chart and ekg, as well as notes from prior ED visits. Patient presented for altered mental status, we will assess with labs and imaging as indicated at this time.   Procedures  Belinda Jenkins was evaluated in Emergency Department on 08/19/2019 for the symptoms described in the history of present illness. She was evaluated in the context of the global COVID-19 pandemic, which necessitated consideration that the patient might be at risk for infection with the SARS-CoV-2 virus that causes COVID-19. Institutional protocols and algorithms that pertain to the evaluation of patients at risk for COVID-19 are in a state of rapid change based on information released by regulatory bodies including the CDC and federal and state organizations. These policies and algorithms were followed during the patient's care in the ED.  ____________________________________________   LABS (pertinent positives/negatives)  Labs Reviewed  CBC WITH DIFFERENTIAL/PLATELET - Abnormal; Notable for the following components:      Result Value   Neutro Abs 8.4 (*)    Lymphs Abs 0.3 (*)    Abs Immature Granulocytes 0.11 (*)    All other components within normal limits  COMPREHENSIVE METABOLIC PANEL - Abnormal; Notable for the following components:   Sodium 133 (*)    Chloride 86 (*)    Glucose, Bld 186 (*)    BUN 24 (*)    Total Protein 8.3 (*)    Total Bilirubin 1.8 (*)    Anion gap 17 (*)    All other components within normal limits  BLOOD GAS, VENOUS - Abnormal; Notable for the following components:   pH, Ven 7.18 (*)    pCO2, Ven 91 (*)    Bicarbonate 34.0 (*)    Acid-Base Excess 2.6 (*)    All other  components within normal limits  TROPONIN I (HIGH SENSITIVITY) - Abnormal; Notable for the following components:   Troponin I (High Sensitivity) 217 (*)    All other components within normal limits  RESPIRATORY PANEL BY RT PCR (FLU A&B, COVID)  URINALYSIS, COMPLETE (UACMP) WITH MICROSCOPIC  CBG MONITORING, ED   CRITICAL CARE Performed by: Laurence Aly   Total critical care time: 30 minutes  Critical care time was exclusive of separately billable procedures and treating other patients.  Critical care was necessary to treat or prevent imminent or life-threatening deterioration.  Critical care was time spent personally by me on the following activities: development of treatment plan with patient and/or surrogate as well as nursing, discussions with consultants, evaluation of patient's response to treatment, examination of patient, obtaining history from patient or surrogate, ordering and performing treatments and interventions, ordering and review of  laboratory studies, ordering and review of radiographic studies, pulse oximetry and re-evaluation of patient's condition.  RADIOLOGY Images were viewed by me  CT head, chest x-ray IMPRESSION: 1. Negative for acute intracranial hemorrhage or acute interval change as compared with 02/08/2018 2. 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 Mild chronic small vessel ischemic change of the white matter IMPRESSION: No active disease. ____________________________________________   DIFFERENTIAL DIAGNOSIS   Respiratory failure, hypercarbia, hypoxia, sepsis, pneumonia, CVA, MI  FINAL ASSESSMENT AND PLAN  Acute on chronic respiratory failure, elevated troponin   Plan: The patient had presented for altered mental status. Patient's labs did indicate significant acute on chronic respiratory failure with hypercarbia and hypoxemia.  Patient's imaging did not reveal any acute process.  Initially she was on 5 L nasal cannula oxygen but was subsequently switched to BiPAP to improve with her oxygen and carbon dioxide levels.  Troponin elevation is likely demand related secondary to above.  I will discuss with the hospitalist for admission.   Laurence Aly, MD    Note: This note was generated in part or whole with voice recognition software. Voice recognition is usually quite accurate but there are transcription errors that can and very often do occur. I apologize for any typographical errors that were not detected and corrected.     Earleen Newport, MD 08/19/19 1728

## 2019-08-19 NOTE — ED Triage Notes (Addendum)
First nurse note- pt has been laying in the bed for 2 days per family. Been lethargic and confused. Hx of COPD.  On O2 at baseline.  Daughter remains with pt.  Has likely not had her bipap on last 2 nights.

## 2019-08-19 NOTE — H&P (Signed)
History and Physical    Belinda Jenkins [id number] DOB: January 03, 1951 DOA: 08/19/2019  PCP: Adin Hector, MD   Patient coming from: Home.  Lives with her husband and quite independent with ADLs.  Walks with the help of cane.  I have personally briefly reviewed patient's old medical records in [place]  Chief Complaint: Altered mental status  HPI: Belinda Jenkins is a 69 y.o. female with medical history significant of COPD on 2-2.5 L at home, diabetes, hypertension and vertigo was brought to ED with altered mental status.  Per daughter patient has been lying in bed for past 2 days and seems lethargic.  Apparently patient was not using her BiPAP at night due to some problem with the mask, her mask broke approximately a week ago.  They contacted the supply company themselves and also through her pulmonologist for a new mask which was declined stating that she needs a new sleep study?? Cording to daughter she does not has any recent upper respiratory symptoms, cough, congestion, wheezing, fever or chills.  Denies any fever or chills.  Denies any chest pain.  No nausea, vomiting or diarrhea.  No urinary symptoms.  For the past 2 days she was very lethargic to eat so did not even took her meds.  ED Course: On arrival patient was tachycardic at 113, tachypneic with respiratory rate of 28, blood pressure of 139/98 and saturating in mid 80s, saturation improved with BiPAP.  Venous blood gas with mild acidosis and hypercarbia.  Mildly elevated troponin, CT head without any acute abnormality.  Chest x-ray without any abnormality.  Admitted for acute on chronic hypercarbic respiratory failure.  Review of Systems: As per HPI otherwise 10 point review of systems negative.   Past Medical History:  Diagnosis Date  . COPD (chronic obstructive pulmonary disease) ([place])   . Diabetes mellitus without complication ([place])   . Family history of adverse reaction to anesthesia    sister - slow to  wake after 1 procedure  . Hypertension   . Vertigo     Past Surgical History:  Procedure Laterality Date  . ABDOMINAL HYSTERECTOMY    . BACK SURGERY     L5  . BLEPHAROPLASTY Bilateral   . BREAST SURGERY     lumpectomy(x1), milk duct(x1)  . CATARACT EXTRACTION W/ INTRAOCULAR LENS  IMPLANT, BILATERAL    . COLONOSCOPY WITH PROPOFOL N/A 07/14/2016   Procedure: COLONOSCOPY WITH PROPOFOL;  Surgeon: Lucilla Lame, MD;  Location: [place];  Service: Endoscopy;  Laterality: N/A;  Diabetic - oral meds requests arrival around 8 AM  . HIP SURGERY Left   . POLYPECTOMY  07/14/2016   Procedure: POLYPECTOMY;  Surgeon: Lucilla Lame, MD;  Location: [place];  Service: Endoscopy;;     reports that she quit smoking about 16 years ago. She has never used smokeless tobacco. She reports current alcohol use of about 1.0 standard drinks of alcohol per week. She reports that she does not use drugs.  Allergies  Allergen Reactions  . Sulfamethoxazole-Trimethoprim Other (See Comments)    Sulfa causes tingling  . Morphine     Other reaction(s): Other (See Comments) Morphine causes mind altering.  . Oxycodone-Acetaminophen     Other reaction(s): Other (See Comments) Percocet causes mind altering.  . Sertraline Other (See Comments)    Sertraline causes worsening depression.  . Adhesive [Tape] Rash    Some bandaids cause rash    No family history on file.  Prior to Admission  medications   Medication Sig Start Date End Date Taking? Authorizing Provider  alendronate (FOSAMAX) 70 MG tablet Take 70 mg by mouth once a week.     [provider]  ALPRAZolam Duanne Moron) 0.25 MG tablet Take 2 tablets (0.5 mg total) by mouth 2 (two) times daily as needed for anxiety. 03/03/18   Max Sane, MD  budesonide (PULMICORT) 0.5 MG/2ML nebulizer solution Take 2 mLs (0.5 mg total) by nebulization 2 (two) times daily. DX:J44.9 12/24/18   Wilhelmina Mcardle, MD  Cholecalciferol (VITAMIN D3) 2000 units  capsule Take 2,000 Units by mouth daily.    [provider]  ipratropium-albuterol (DUONEB) 0.5-2.5 (3) MG/3ML SOLN INHALE 3 MLS INTO THE LUNGS 4 TIMES DAILY(AND EVERY 3 HOURS AS NEEDED FOR WHEEZING/SOB) 12/23/18   Wilhelmina Mcardle, MD  metFORMIN (GLUCOPHAGE) 1000 MG tablet Take 1,000 mg by mouth 2 (two) times daily.     [provider]  metoprolol tartrate (LOPRESSOR) 25 MG tablet Take 12.5 mg by mouth 2 (two) times daily. 02/17/18   [provider]  valsartan (DIOVAN) 160 MG tablet Take 160 mg by mouth daily.    [provider]  vitamin B-12 (CYANOCOBALAMIN) 1000 MCG tablet Take 1,000 mcg by mouth daily.    [provider]    Physical Exam: Vitals:   08/19/19 1613 08/19/19 1700 08/19/19 1710 08/19/19 1713  BP: (!) 139/98     Pulse: (!) 120 (!) 115 (!) 113   Resp: (!) 22 (!) 29 (!) 28   Temp: 97.8 F (36.6 C)     TempSrc: Axillary     SpO2: (!) 85% 98%    Weight:    61.2 kg  Height:    5\' 2"  (1.575 m)    General: Vital signs reviewed.  Patient is well-developed and well-nourished, in no acute distress and cooperative with exam.  She was with BiPAP. Head: Normocephalic and atraumatic. Eyes: EOMI, conjunctivae normal, no scleral icterus.  ENMT: Mucous membranes are moist.  Neck: Supple, trachea midline, normal ROM, no JVD, masses, thyromegaly, or carotid bruit present.  Cardiovascular: Sinus tachycardia, no murmurs, gallops, or rubs. Pulmonary/Chest: Clear to auscultation bilaterally, no wheezes, rales, or rhonchi. Abdominal: Soft, non-tender, non-distended, BS +,  Extremities: No lower extremity edema bilaterally,  pulses symmetric and intact bilaterally. No cyanosis or clubbing. Neurological: Alert and was answering yes or no as she was on BiPAP, follows some commands but unable to participate her neuro exam. Skin: Warm, dry and intact. No rashes or erythema. Psychiatric: Normal mood and affect.   Labs on Admission: I have personally  reviewed following labs and imaging studies  CBC: Recent Labs  Lab 08/19/19 1633  WBC 9.7  NEUTROABS 8.4*  HGB 13.3  HCT 44.1  MCV 90.4  PLT 123456   Basic Metabolic Panel: Recent Labs  Lab 08/19/19 1633  NA 133*  K 4.9  CL 86*  CO2 30  GLUCOSE 186*  BUN 24*  CREATININE 0.68  CALCIUM 9.5   GFR: Estimated Creatinine Clearance: 57.1 mL/min (by C-G formula based on SCr of 0.68 mg/dL). Liver Function Tests: Recent Labs  Lab 08/19/19 1633  AST 24  ALT 13  ALKPHOS 53  BILITOT 1.8*  PROT 8.3*  ALBUMIN 5.0   No results for input(s): LIPASE, AMYLASE in the last 168 hours. No results for input(s): AMMONIA in the last 168 hours. Coagulation Profile: No results for input(s): INR, PROTIME in the last 168 hours. Cardiac Enzymes: No results for input(s): CKTOTAL, CKMB, CKMBINDEX,  TROPONINI in the last 168 hours. BNP (last 3 results) No results for input(s): PROBNP in the last 8760 hours. HbA1C: No results for input(s): HGBA1C in the last 72 hours. CBG: No results for input(s): GLUCAP in the last 168 hours. Lipid Profile: No results for input(s): CHOL, HDL, LDLCALC, TRIG, CHOLHDL, LDLDIRECT in the last 72 hours. Thyroid Function Tests: No results for input(s): TSH, T4TOTAL, FREET4, T3FREE, THYROIDAB in the last 72 hours. Anemia Panel: No results for input(s): VITAMINB12, FOLATE, FERRITIN, TIBC, IRON, RETICCTPCT in the last 72 hours. Urine analysis:    Component Value Date/Time   COLORURINE YELLOW (A) 02/25/2018 1259   APPEARANCEUR CLEAR (A) 02/25/2018 1259   LABSPEC 1.028 02/25/2018 1259   PHURINE 5.0 02/25/2018 1259   GLUCOSEU >=500 (A) 02/25/2018 1259   HGBUR NEGATIVE 02/25/2018 1259   BILIRUBINUR NEGATIVE 02/25/2018 1259   KETONESUR 5 (A) 02/25/2018 1259   PROTEINUR NEGATIVE 02/25/2018 1259   NITRITE NEGATIVE 02/25/2018 1259   LEUKOCYTESUR NEGATIVE 02/25/2018 1259    Radiological Exams on Admission: DG Chest 1 View  Result Date: 08/19/2019 CLINICAL DATA:   Dyspnea EXAM: CHEST  1 VIEW COMPARISON:  02/25/2018 FINDINGS: Hyperinflation without focal opacity or pleural effusion. Normal cardiomediastinal silhouette with aortic atherosclerosis. No pneumothorax. IMPRESSION: No active disease. Electronically Signed   By: Donavan Foil M.D.   On: 08/19/2019 17:19   CT Head Wo Contrast  Result Date: 08/19/2019 CLINICAL DATA:  Encephalopathy, lethargic EXAM: CT HEAD WITHOUT CONTRAST TECHNIQUE: Contiguous axial images were obtained from the base of the skull through the vertex without intravenous contrast. COMPARISON:  CT brain 02/08/2018 FINDINGS: Brain: No acute territorial infarction, hemorrhage or intracranial mass is visualized. Mild atrophy and chronic small vessel ischemic change of the white matter. Moderate ventriculomegaly, similar compared to previous exam, disproportionate to degree of atrophy present. Vascular: No hyperdense vessels.  Carotid vascular calcification. Skull: Normal. Negative for fracture or focal lesion. Sinuses/Orbits: No acute finding. Other: None IMPRESSION: 1. Negative for acute intracranial hemorrhage or acute interval change as compared with 02/08/2018 2. 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 Mild chronic small vessel ischemic change of the white matter Electronically Signed   By: Donavan Foil M.D.   On: 08/19/2019 17:05    EKG: Independently reviewed.  Sinus tachycardia with premature ventricular complexes, right bundle branch block and T wave inversions in anteroinferior leads.  Similar changes present on prior EKGs.  Assessment/Plan Active Problems:   Acute on chronic respiratory failure with hypoxia and hypercapnia (HCC)   Acute on chronic hypoxic and hypercarbic respiratory failure.  Most likely secondary to not compliant with regular BiPAP use as directed.  Patient responded well on BiPAP. No concern for COPD  exacerbation. -Admit under observation at progressive care unit. -Continue with as needed BiPAP. -Supplemental oxygen. -Continue home bronchodilators. -We will try getting her a new mask from hospital if possible before discharge.  Elevated troponin.  Initial troponin at 217.  No chest pain.  Most likely secondary to demand as patient was hypoxic and hypercapnic. -Trend troponin. -Repeat EKG in the morning.  Type 2 diabetes mellitus.  Seems well controlled on Metformin. -Recheck A1c. -SSI  Hypertension.  -Continue home dose of Lopressor and valsartan.  DVT prophylaxis: Lovenox Code Status: Full code Family Communication: Daughter was updated at bedside. Disposition Plan: Will go back home. Consults called: None Admission status: Observation   Lorella Nimrod MD [facility]  If 7PM-7AM,  please contact night-coverage [email or web address]  08/19/2019, 6:29 PM   This record has been created using Dragon voice recognition software. Errors have been sought and corrected,but may not always be located. Such creation errors do not reflect on the standard of care.

## 2019-08-19 NOTE — ED Notes (Signed)
X-ray at bedside

## 2019-08-19 NOTE — Progress Notes (Signed)
Pt was admitted on the floor with BIPAP. Pt was confused and unable to answer question. Admission question was answered by daugheter [name]. Pt was on low bed, ascom with pt reached. Will continue to monitor.

## 2019-08-19 NOTE — ED Notes (Signed)
Date and time results received: 08/19/19 5:07 PM   Test: Troponin Critical Value: 217  Name of Provider Notified: Dr Jimmye Norman

## 2019-08-19 NOTE — Progress Notes (Signed)
Patient is admitted to [facility] on [address]. Patient's daughter has concern about the patient's bipap unit at home. The mask is torn and does not fit properly. The home care company has refused to replace the mask. Patient's daughter has concerns about patient's safety upon returning home, without properly fitting mask. Follow up with home care company via MD and discharge planning is recommended.

## 2019-08-19 NOTE — ED Notes (Signed)
Date and time results received: 08/19/19 4:50 PM  (use smartphrase ".now" to insert current time)  Test: pH and pCO2 Critical Value: 7.19 and 91  Name of Provider Notified: Dr. Jimmye Norman  Orders Received? Or Actions Taken?:Bipap ordered for pt

## 2019-08-20 ENCOUNTER — Observation Stay: Payer: Medicare Other

## 2019-08-20 DIAGNOSIS — G9340 Encephalopathy, unspecified: Secondary | ICD-10-CM | POA: Diagnosis not present

## 2019-08-20 DIAGNOSIS — I10 Essential (primary) hypertension: Secondary | ICD-10-CM | POA: Diagnosis present

## 2019-08-20 DIAGNOSIS — G912 (Idiopathic) normal pressure hydrocephalus: Secondary | ICD-10-CM | POA: Diagnosis present

## 2019-08-20 DIAGNOSIS — E119 Type 2 diabetes mellitus without complications: Secondary | ICD-10-CM | POA: Diagnosis present

## 2019-08-20 DIAGNOSIS — Z885 Allergy status to narcotic agent status: Secondary | ICD-10-CM | POA: Diagnosis not present

## 2019-08-20 DIAGNOSIS — Z882 Allergy status to sulfonamides status: Secondary | ICD-10-CM | POA: Diagnosis not present

## 2019-08-20 DIAGNOSIS — Z9841 Cataract extraction status, right eye: Secondary | ICD-10-CM | POA: Diagnosis not present

## 2019-08-20 DIAGNOSIS — Z79899 Other long term (current) drug therapy: Secondary | ICD-10-CM | POA: Diagnosis not present

## 2019-08-20 DIAGNOSIS — J9621 Acute and chronic respiratory failure with hypoxia: Secondary | ICD-10-CM | POA: Diagnosis present

## 2019-08-20 DIAGNOSIS — I2729 Other secondary pulmonary hypertension: Secondary | ICD-10-CM | POA: Diagnosis not present

## 2019-08-20 DIAGNOSIS — E872 Acidosis: Secondary | ICD-10-CM | POA: Diagnosis present

## 2019-08-20 DIAGNOSIS — R778 Other specified abnormalities of plasma proteins: Secondary | ICD-10-CM | POA: Diagnosis present

## 2019-08-20 DIAGNOSIS — J9622 Acute and chronic respiratory failure with hypercapnia: Secondary | ICD-10-CM | POA: Diagnosis present

## 2019-08-20 DIAGNOSIS — Z961 Presence of intraocular lens: Secondary | ICD-10-CM | POA: Diagnosis present

## 2019-08-20 DIAGNOSIS — I248 Other forms of acute ischemic heart disease: Secondary | ICD-10-CM | POA: Diagnosis present

## 2019-08-20 DIAGNOSIS — Z87891 Personal history of nicotine dependence: Secondary | ICD-10-CM | POA: Diagnosis not present

## 2019-08-20 DIAGNOSIS — G92 Toxic encephalopathy: Secondary | ICD-10-CM | POA: Diagnosis present

## 2019-08-20 DIAGNOSIS — Z9981 Dependence on supplemental oxygen: Secondary | ICD-10-CM | POA: Diagnosis not present

## 2019-08-20 DIAGNOSIS — Z888 Allergy status to other drugs, medicaments and biological substances status: Secondary | ICD-10-CM | POA: Diagnosis not present

## 2019-08-20 DIAGNOSIS — Z90710 Acquired absence of both cervix and uterus: Secondary | ICD-10-CM | POA: Diagnosis not present

## 2019-08-20 DIAGNOSIS — Z7951 Long term (current) use of inhaled steroids: Secondary | ICD-10-CM | POA: Diagnosis not present

## 2019-08-20 DIAGNOSIS — Z7984 Long term (current) use of oral hypoglycemic drugs: Secondary | ICD-10-CM | POA: Diagnosis not present

## 2019-08-20 DIAGNOSIS — J449 Chronic obstructive pulmonary disease, unspecified: Secondary | ICD-10-CM | POA: Diagnosis present

## 2019-08-20 DIAGNOSIS — Z20822 Contact with and (suspected) exposure to covid-19: Secondary | ICD-10-CM | POA: Diagnosis present

## 2019-08-20 DIAGNOSIS — Z9842 Cataract extraction status, left eye: Secondary | ICD-10-CM | POA: Diagnosis not present

## 2019-08-20 DIAGNOSIS — Z7983 Long term (current) use of bisphosphonates: Secondary | ICD-10-CM | POA: Diagnosis not present

## 2019-08-20 LAB — BLOOD GAS, ARTERIAL
Acid-Base Excess: 11.8 mmol/L — ABNORMAL HIGH (ref 0.0–2.0)
Allens test (pass/fail): POSITIVE — AB
Bicarbonate: 43.4 mmol/L — ABNORMAL HIGH (ref 20.0–28.0)
FIO2: 36
O2 Saturation: 98.4 %
Patient temperature: 37
pCO2 arterial: 99 mmHg (ref 32.0–48.0)
pH, Arterial: 7.25 — ABNORMAL LOW (ref 7.350–7.450)
pO2, Arterial: 128 mmHg — ABNORMAL HIGH (ref 83.0–108.0)

## 2019-08-20 LAB — GLUCOSE, CAPILLARY
Glucose-Capillary: 106 mg/dL — ABNORMAL HIGH (ref 70–99)
Glucose-Capillary: 106 mg/dL — ABNORMAL HIGH (ref 70–99)
Glucose-Capillary: 120 mg/dL — ABNORMAL HIGH (ref 70–99)
Glucose-Capillary: 130 mg/dL — ABNORMAL HIGH (ref 70–99)
Glucose-Capillary: 131 mg/dL — ABNORMAL HIGH (ref 70–99)
Glucose-Capillary: 145 mg/dL — ABNORMAL HIGH (ref 70–99)
Glucose-Capillary: 159 mg/dL — ABNORMAL HIGH (ref 70–99)

## 2019-08-20 LAB — BASIC METABOLIC PANEL
Anion gap: 9 (ref 5–15)
BUN: 26 mg/dL — ABNORMAL HIGH (ref 8–23)
CO2: 32 mmol/L (ref 22–32)
Calcium: 8.7 mg/dL — ABNORMAL LOW (ref 8.9–10.3)
Chloride: 93 mmol/L — ABNORMAL LOW (ref 98–111)
Creatinine, Ser: 0.49 mg/dL (ref 0.44–1.00)
GFR calc Af Amer: >60 mL/min (ref >60)
GFR calc non Af Amer: >60 mL/min (ref >60)
Glucose, Bld: 138 mg/dL — ABNORMAL HIGH (ref 70–99)
Potassium: 4.2 mmol/L (ref 3.5–5.1)
Sodium: 134 mmol/L — ABNORMAL LOW (ref 135–145)

## 2019-08-20 LAB — CBC
HCT: 36.4 % (ref 36.0–46.0)
Hemoglobin: 11.6 g/dL — ABNORMAL LOW (ref 12.0–15.0)
MCH: 27.8 pg (ref 26.0–34.0)
MCHC: 31.9 g/dL (ref 30.0–36.0)
MCV: 87.3 fL (ref 80.0–100.0)
Platelets: 239 10*3/uL (ref 150–400)
RBC: 4.17 MIL/uL (ref 3.87–5.11)
RDW: 13.2 % (ref 11.5–15.5)
WBC: 5.7 10*3/uL (ref 4.0–10.5)
nRBC: 0 % (ref 0.0–0.2)

## 2019-08-20 LAB — PHOSPHORUS: Phosphorus: 2.8 mg/dL (ref 2.5–4.6)

## 2019-08-20 LAB — TSH: TSH: 0.419 u[IU]/mL (ref 0.350–4.500)

## 2019-08-20 LAB — MAGNESIUM: Magnesium: 1.9 mg/dL (ref 1.7–2.4)

## 2019-08-20 MED ORDER — ACETAMINOPHEN 500 MG PO TABS
1000.0000 mg | ORAL_TABLET | Freq: Four times a day (QID) | ORAL | Status: DC | PRN
  Administered 2019-08-20: 1000 mg via ORAL
  Filled 2019-08-20: qty 2

## 2019-08-20 MED ORDER — IPRATROPIUM-ALBUTEROL 0.5-2.5 (3) MG/3ML IN SOLN
3.0000 mL | Freq: Four times a day (QID) | RESPIRATORY_TRACT | Status: DC
  Administered 2019-08-20 – 2019-08-22 (×9): 3 mL via RESPIRATORY_TRACT
  Filled 2019-08-20 (×9): qty 3

## 2019-08-20 MED ORDER — LABETALOL HCL 5 MG/ML IV SOLN
10.0000 mg | Freq: Once | INTRAVENOUS | Status: AC
  Administered 2019-08-20: 10 mg via INTRAVENOUS
  Filled 2019-08-20: qty 4

## 2019-08-20 NOTE — Plan of Care (Signed)
  Problem: Clinical Measurements: Goal: Respiratory complications will improve Outcome: Progressing   Problem: Safety: Goal: Ability to remain free from injury will improve Outcome: Progressing   

## 2019-08-20 NOTE — Progress Notes (Signed)
The patient was seen and examined today.  Formal consult note to follow.  I have discussed with the patient's daughter at bedside.  Patient has acute on chronic hypercarbic respiratory failure.  She is BiPAP dependent at home.  Issues with obtaining mask for her BiPAP.  Arterial blood gas shows hypercarbia and mild respiratory acidosis.  She is not fully compensated yet.  She will need repeat arterial blood gas once fully compensated to obtain equipment for management of respiratory failure (not for OSA).  Other issues of concern is that the patient has had mental decline that has been gradual.  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  MRI pending.  It would be prudent to consider neurologic evaluation of potential NPH.  Initiated conversation with patient's daughter with regards to end-of-life issues and goals of care.  Patient remains full code but goals of care discussion was initiated.  Consider Palliative Care consult.   Renold Don, MD [facility]   *This note was dictated using voice recognition software/Dragon.  Despite best efforts to proofread, errors can occur which can change the meaning.  Any change was purely unintentional.

## 2019-08-20 NOTE — Progress Notes (Addendum)
PROGRESS NOTE    Belinda Jenkins  [id number] DOB: 04/03/1951 DOA: 08/19/2019 PCP: Adin Hector, MD   Brief Narrative:   Belinda Jenkins is a 69 y.o. female with medical history significant of COPD on 2-2.5 L at home, diabetes, hypertension and vertigo was brought to ED with altered mental status.  Per daughter patient has been lying in bed for past 2 days and seems lethargic.  Apparently patient was not using her BiPAP at night due to some problem with the mask, her mask broke approximately a week ago.  They contacted the supply company themselves and also through her pulmonologist for a new mask which was declined stating that she needs a new sleep study?? Found to be hypoxic and hypercarbic initially, placed on BiPAP overnight, hypoxia improved after weaning her off from BiPAP, remained little confused and having some word finding difficulties, repeat ABG with persistent CO2 of 99.  Subjective: Patient was complaining of some headaches earlier in the morning but that has been improved when asked during rounds.  She appears little confused and just answering yes, no, okay or good.  Reevaluated once daughter was in the room and according to her this is not her baseline.  She is able to communicate very well.  Appears to have some word finding difficulties.  Assessment & Plan:   Active Problems:   Acute on chronic respiratory failure with hypoxia and hypercapnia (HCC)   Acute on chronic respiratory failure with hypercapnia (HCC)  Acute on chronic hypoxic and hypercarbic respiratory failure.   Oxygen resolved but patient remained hypercarbic despite being on BiPAP the whole night.  Repeat ABG was done due to persistence of encephalopathy which shows pH of 7.25 with CO2 of 99, O2 of 128, bicarb of 43.4. A-a gradient elevated which is consistent with her underlying lung disease. -MRI was ordered-because of word finding difficulties, CT head done yesterday was normal, most likely  secondary to persistent hypercarbia. -Put her back on continuous BiPAP. -Check TSH, magnesium and phosphorus levels. -Consult pulmonary.  Elevated troponin.  Initial troponin at 217>>207.  No chest pain.  Most likely secondary to demand as patient was hypoxic and hypercapnic. -Continue to monitor.  Type 2 diabetes mellitus.  Seems well controlled on Metformin. -Recheck A1c- 6.4 -SSI  Hypertension.   Blood pressure elevated this morning. -Continue home dose of Lopressor and valsartan. -Continue to monitor. -IV labetalol as needed.  Objective: Vitals:   08/20/19 0848 08/20/19 1101 08/20/19 1124 08/20/19 1150  BP:    (!) 185/89  Pulse:    96  Resp: 17   19  Temp:    98.6 F (37 C)  TempSrc:    Oral  SpO2: 100% 100% 100% 98%  Weight:      Height:        Intake/Output Summary (Last 24 hours) at 08/20/2019 1327 Last data filed at 08/20/2019 0433 Gross per 24 hour  Intake 1690.03 ml  Output 150 ml  Net 1540.03 ml   Filed Weights   08/19/19 1713  Weight: 61.2 kg    Examination:  General exam: Appears calm and comfortable  Respiratory system: Clear to auscultation. Respiratory effort normal. Cardiovascular system: S1 & S2 heard, RRR. No JVD, murmurs, rubs, gallops or clicks. Gastrointestinal system: Soft, nontender, nondistended, bowel sounds positive. Central nervous system: Alert and oriented. No focal neurological deficits.yesterday have mild word finding difficulties and not talking much but able to follow commands. Extremities: No edema, no cyanosis, pulses intact and symmetrical.  Skin: No rashes, lesions or ulcers Psychiatry: Judgement and insight appear impaired.   DVT prophylaxis: Lovenox Code Status: Full Family Communication: Daughter was updated at bedside. Disposition Plan:  Status is: Inpatient  Remains inpatient appropriate because:Inpatient level of care appropriate due to severity of illness   Dispo: The patient is from: Home               Anticipated d/c is to: Home              Anticipated d/c date is: 2 days              Patient currently is not medically stable to d/c.  Patient came with acute on chronic hypoxic and hypercarbic respiratory failure secondary to malfunctioning home BiPAP mask.  Hypoxia resolved but continued to have encephalopathy secondary to hypercarbia.  Will take longer duration of BiPAP. Mild need escalation of care.  Consultants:   Pulmonary  Procedures:  Antimicrobials:   Data Reviewed: I have personally reviewed following labs and imaging studies  CBC: Recent Labs  Lab 08/19/19 1633 08/20/19 0459  WBC 9.7 5.7  NEUTROABS 8.4*  --   HGB 13.3 11.6*  HCT 44.1 36.4  MCV 90.4 87.3  PLT 304 [id number]   Basic Metabolic Panel: Recent Labs  Lab 08/19/19 1633 08/20/19 0459  NA 133* 134*  K 4.9 4.2  CL 86* 93*  CO2 30 32  GLUCOSE 186* 138*  BUN 24* 26*  CREATININE 0.68 0.49  CALCIUM 9.5 8.7*   GFR: Estimated Creatinine Clearance: 57.1 mL/min (by C-G formula based on SCr of 0.49 mg/dL). Liver Function Tests: Recent Labs  Lab 08/19/19 1633  AST 24  ALT 13  ALKPHOS 53  BILITOT 1.8*  PROT 8.3*  ALBUMIN 5.0   No results for input(s): LIPASE, AMYLASE in the last 168 hours. No results for input(s): AMMONIA in the last 168 hours. Coagulation Profile: No results for input(s): INR, PROTIME in the last 168 hours. Cardiac Enzymes: No results for input(s): CKTOTAL, CKMB, CKMBINDEX, TROPONINI in the last 168 hours. BNP (last 3 results) No results for input(s): PROBNP in the last 8760 hours. HbA1C: Recent Labs    08/19/19 1909  HGBA1C 6.4*   CBG: Recent Labs  Lab 08/20/19 0000 08/20/19 0420 08/20/19 0739 08/20/19 1151  GLUCAP 106* 120* 145* 159*   Lipid Profile: No results for input(s): CHOL, HDL, LDLCALC, TRIG, CHOLHDL, LDLDIRECT in the last 72 hours. Thyroid Function Tests: No results for input(s): TSH, T4TOTAL, FREET4, T3FREE, THYROIDAB in the last 72 hours. Anemia Panel: No  results for input(s): VITAMINB12, FOLATE, FERRITIN, TIBC, IRON, RETICCTPCT in the last 72 hours. Sepsis Labs: No results for input(s): PROCALCITON, LATICACIDVEN in the last 168 hours.  Recent Results (from the past 240 hour(s))  Respiratory Panel by RT PCR (Flu A&B, Covid) - Nasopharyngeal Swab     Status: None   Collection Time: 08/19/19  4:33 PM   Specimen: Nasopharyngeal Swab  Result Value Ref Range Status   SARS Coronavirus 2 by RT PCR NEGATIVE NEGATIVE Final    Comment: (NOTE) SARS-CoV-2 target nucleic acids are NOT DETECTED. The SARS-CoV-2 RNA is generally detectable in upper respiratoy specimens during the acute phase of infection. The lowest concentration of SARS-CoV-2 viral copies this assay can detect is 131 copies/mL. A negative result does not preclude SARS-Cov-2 infection and should not be used as the sole basis for treatment or other patient management decisions. A negative result may occur with  improper specimen collection/handling, submission of  specimen other than nasopharyngeal swab, presence of viral mutation(s) within the areas targeted by this assay, and inadequate number of viral copies (<131 copies/mL). A negative result must be combined with clinical observations, patient history, and epidemiological information. The expected result is Negative. Fact Sheet for Patients:  PinkCheek.be Fact Sheet for Healthcare Providers:  GravelBags.it This test is not yet ap proved or cleared by the [place] FDA and  has been authorized for detection and/or diagnosis of SARS-CoV-2 by FDA under an Emergency Use Authorization (EUA). This EUA will remain  in effect (meaning this test can be used) for the duration of the COVID-19 declaration under Section 564(b)(1) of the Act, 21 U.S.C. section 360bbb-3(b)(1), unless the authorization is terminated or revoked sooner.    Influenza A by PCR NEGATIVE NEGATIVE Final    Influenza B by PCR NEGATIVE NEGATIVE Final    Comment: (NOTE) The Xpert Xpress SARS-CoV-2/FLU/RSV assay is intended as an aid in  the diagnosis of influenza from Nasopharyngeal swab specimens and  should not be used as a sole basis for treatment. Nasal washings and  aspirates are unacceptable for Xpert Xpress SARS-CoV-2/FLU/RSV  testing. Fact Sheet for Patients: PinkCheek.be Fact Sheet for Healthcare Providers: GravelBags.it This test is not yet approved or cleared by the [place] FDA and  has been authorized for detection and/or diagnosis of SARS-CoV-2 by  FDA under an Emergency Use Authorization (EUA). This EUA will remain  in effect (meaning this test can be used) for the duration of the  Covid-19 declaration under Section 564(b)(1) of the Act, 21  U.S.C. section 360bbb-3(b)(1), unless the authorization is  terminated or revoked. Performed at [facility], [address] [place], [place] [address]      Radiology Studies: DG Chest 1 View  Result Date: 08/19/2019 CLINICAL DATA:  Dyspnea EXAM: CHEST  1 VIEW COMPARISON:  02/25/2018 FINDINGS: Hyperinflation without focal opacity or pleural effusion. Normal cardiomediastinal silhouette with aortic atherosclerosis. No pneumothorax. IMPRESSION: No active disease. Electronically Signed   By: Donavan Foil M.D.   On: 08/19/2019 17:19   CT Head Wo Contrast  Result Date: 08/19/2019 CLINICAL DATA:  Encephalopathy, lethargic EXAM: CT HEAD WITHOUT CONTRAST TECHNIQUE: Contiguous axial images were obtained from the base of the skull through the vertex without intravenous contrast. COMPARISON:  CT brain 02/08/2018 FINDINGS: Brain: No acute territorial infarction, hemorrhage or intracranial mass is visualized. Mild atrophy and chronic small vessel ischemic change of the white matter. Moderate ventriculomegaly, similar compared to previous exam, disproportionate to degree of atrophy  present. Vascular: No hyperdense vessels.  Carotid vascular calcification. Skull: Normal. Negative for fracture or focal lesion. Sinuses/Orbits: No acute finding. Other: None IMPRESSION: 1. Negative for acute intracranial hemorrhage or acute interval change as compared with 02/08/2018 2. 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 Mild chronic small vessel ischemic change of the white matter Electronically Signed   By: Donavan Foil M.D.   On: 08/19/2019 17:05    Scheduled Meds: . budesonide  0.5 mg Nebulization BID  . enoxaparin (LOVENOX) injection  40 mg Subcutaneous Q24H  . insulin aspart  0-9 Units Subcutaneous Q4H  . irbesartan  150 mg Oral Daily  . metoprolol tartrate  12.5 mg Oral BID  . sodium chloride flush  3 mL Intravenous Q12H   Continuous Infusions: . lactated ringers 100 mL/hr at 08/20/19 1235     LOS: 0 days   Time spent: 45 minutes.  Lorella Nimrod, MD [facility]  If 7PM-7AM, please contact night-coverage [email or web address]  08/20/2019, 1:27 PM   This record has been created using Systems analyst. Errors have been sought and corrected,but may not always be located. Such creation errors do not reflect on the standard of care.

## 2019-08-20 NOTE — Plan of Care (Signed)
  Problem: Pain Managment: Goal: General experience of comfort will improve Outcome: Progressing   Problem: Safety: Goal: Ability to remain free from injury will improve Outcome: Progressing   

## 2019-08-20 NOTE — Progress Notes (Signed)
Spoke with respiratory regarding abg result. Per rt CO2 99, will place back on continuous bipap.currently on 1l for mri will place back on bipap once patient returns to the floor will continue to monitor closely

## 2019-08-20 NOTE — Progress Notes (Addendum)
Unable to weight pt. Hoyer lift is not available at this time.  Update 0629: Pt was weaned off BIPAP and placed on 4 liters [place] with the respiratory assistance. Pt was complaining of headache. Notify NP Randol Kern. Will continue to monitor.  Update 0640: NP ordered tylenol 1000 mg tablet every 6 hours as needed for headache. Will continue to monitor.

## 2019-08-20 NOTE — Consult Note (Addendum)
Reason for Consult: Assistance with management of acute on chronic respiratory failure with hypercarbia and hypoxia. Referring Physician: Lorella Nimrod, MD  Belinda Jenkins is an 69 y.o. female.  HPI: Belinda Jenkins is a 69 year old female, former smoker with very severe COPD (GOLD class IV) most recent FEV1 of 0.48 L in 2016.  She has been O2 dependent since approximately 2010.  She was brought to the [facility] ED with altered mental status with severe hypercapnia on ABG.  She was placed on BiPAP and was able to turn around mental status wise.  She continues to have significant hypercarbia.  She apparently has had difficulties obtaining a mask for her home BiPAP unit.  She is on BiPAP for chronic respiratory failure.  She previously followed with Dr. Merton Border, however since Dr. Jamal Collin left the practice she has been lost to follow-up due to confusion with regards to her appointment.  The patient cannot provide further history today, she is currently on BiPAP.  She still appears somewhat confused.  It appears that at baseline she struggles with her ADLs.  It also appears that she may be declining somewhat mentally.  This is a new finding.  Daughter is at bedside today there is no description of any fevers, chills or sweats.  The patient was noted to have been basically bedridden for the last 2 days prior to admission.  PRIOR DATA: 11/27/14 PFTs [facility]): FVC: 1.31 L (48 %pred), FEV1: 0.48 L (22 %pred), FEV1/FVC: 46% consistent with very severe obstructive defect.  Past Medical History:  Diagnosis Date  . COPD (chronic obstructive pulmonary disease) ([place])   . Diabetes mellitus without complication ([place])   . Family history of adverse reaction to anesthesia    sister - slow to wake after 1 procedure  . Hypertension   . Vertigo     Past Surgical History:  Procedure Laterality Date  . ABDOMINAL HYSTERECTOMY    . BACK SURGERY     L5  . BLEPHAROPLASTY Bilateral   . BREAST SURGERY      lumpectomy(x1), milk duct(x1)  . CATARACT EXTRACTION W/ INTRAOCULAR LENS  IMPLANT, BILATERAL    . COLONOSCOPY WITH PROPOFOL N/A 07/14/2016   Procedure: COLONOSCOPY WITH PROPOFOL;  Surgeon: Lucilla Lame, MD;  Location: [place];  Service: Endoscopy;  Laterality: N/A;  Diabetic - oral meds requests arrival around 8 AM  . HIP SURGERY Left   . POLYPECTOMY  07/14/2016   Procedure: POLYPECTOMY;  Surgeon: Lucilla Lame, MD;  Location: [place];  Service: Endoscopy;;    No family history on file.  Social History:  reports that she quit smoking about 16 years ago. She has never used smokeless tobacco. She reports current alcohol use of about 1.0 standard drinks of alcohol per week. She reports that she does not use drugs.  Allergies:  Allergies  Allergen Reactions  . Sulfamethoxazole-Trimethoprim Other (See Comments)    Sulfa causes tingling  . Morphine     Other reaction(s): Other (See Comments) Morphine causes mind altering.  . Oxycodone-Acetaminophen     Other reaction(s): Other (See Comments) Percocet causes mind altering.  . Sertraline Other (See Comments)    Sertraline causes worsening depression.  . Adhesive [Tape] Rash    Some bandaids cause rash    Medications: I have reviewed the patient's current medications.  Results for orders placed or performed during the hospital encounter of 08/19/19 (from the past 48 hour(s))  Blood gas, venous     Status: Abnormal  Collection Time: 08/19/19  4:25 PM  Result Value Ref Range   pH, Ven 7.18 (LL) 7.250 - 7.430    Comment: CRITICAL RESULT CALLED TO, READ BACK BY AND VERIFIED WITH: Verlene Mayer RN ON [id number] @ [id number] HH    pCO2, Ven 91 (HH) 44.0 - 60.0 mmHg    Comment: CRITICAL RESULT CALLED TO, READ BACK BY AND VERIFIED WITH: Verlene Mayer RN ON [id number] @ [id number] HH    pO2, Ven 40.0 32.0 - 45.0 mmHg   Bicarbonate 34.0 (H) 20.0 - 28.0 mmol/L   Acid-Base Excess 2.6 (H) 0.0 - 2.0 mmol/L   O2 Saturation 60.2 %   Patient  temperature 37.0    Collection site VEIN    Sample type VEIN     Comment: Performed at [facility], [address] [place], [place] [address]  CBC with Differential     Status: Abnormal   Collection Time: 08/19/19  4:33 PM  Result Value Ref Range   WBC 9.7 4.0 - 10.5 K/uL   RBC 4.88 3.87 - 5.11 MIL/uL   Hemoglobin 13.3 12.0 - 15.0 g/dL   HCT 44.1 36.0 - 46.0 %   MCV 90.4 80.0 - 100.0 fL   MCH 27.3 26.0 - 34.0 pg   MCHC 30.2 30.0 - 36.0 g/dL   RDW 13.2 11.5 - 15.5 %   Platelets 304 150 - 400 K/uL   nRBC 0.0 0.0 - 0.2 %   Neutrophils Relative % 88 %   Neutro Abs 8.4 (H) 1.7 - 7.7 K/uL   Lymphocytes Relative 3 %   Lymphs Abs 0.3 (L) 0.7 - 4.0 K/uL   Monocytes Relative 7 %   Monocytes Absolute 0.7 0.1 - 1.0 K/uL   Eosinophils Relative 1 %   Eosinophils Absolute 0.1 0.0 - 0.5 K/uL   Basophils Relative 0 %   Basophils Absolute 0.0 0.0 - 0.1 K/uL   Immature Granulocytes 1 %   Abs Immature Granulocytes 0.11 (H) 0.00 - 0.07 K/uL    Comment: Performed at [facility], [address] [place], [place] [address]  Comprehensive metabolic panel     Status: Abnormal   Collection Time: 08/19/19  4:33 PM  Result Value Ref Range   Sodium 133 (L) 135 - 145 mmol/L   Potassium 4.9 3.5 - 5.1 mmol/L   Chloride 86 (L) 98 - 111 mmol/L   CO2 30 22 - 32 mmol/L   Glucose, Bld 186 (H) 70 - 99 mg/dL    Comment: Glucose reference range applies only to samples taken after fasting for at least 8 hours.   BUN 24 (H) 8 - 23 mg/dL   Creatinine, Ser 0.68 0.44 - 1.00 mg/dL   Calcium 9.5 8.9 - 10.3 mg/dL   Total Protein 8.3 (H) 6.5 - 8.1 g/dL   Albumin 5.0 3.5 - 5.0 g/dL   AST 24 15 - 41 U/L   ALT 13 0 - 44 U/L   Alkaline Phosphatase 53 38 - 126 U/L   Total Bilirubin 1.8 (H) 0.3 - 1.2 mg/dL   GFR calc non Af Amer >60 >60 mL/min   GFR calc Af Amer >60 >60 mL/min   Anion gap 17 (H) 5 - 15    Comment: Performed at [facility], [address] [place], [place] [address]   Troponin I (High Sensitivity)     Status: Abnormal   Collection Time: 08/19/19  4:33 PM  Result Value Ref Range   Troponin I (High Sensitivity) 217 (  HH) <18 ng/L    Comment: CRITICAL RESULT CALLED TO, READ BACK BY AND VERIFIED WITH KATIE FERGUSSON AT 1706 08/19/19.PMF (NOTE) Elevated high sensitivity troponin I (hsTnI) values and significant  changes across serial measurements may suggest ACS but many other  chronic and acute conditions are known to elevate hsTnI results.  Refer to the "Links" section for chest pain algorithms and additional  guidance. Performed at [facility], [address] [place], [place] [address]   Respiratory Panel by RT PCR (Flu A&B, Covid) - Nasopharyngeal Swab     Status: None   Collection Time: 08/19/19  4:33 PM   Specimen: Nasopharyngeal Swab  Result Value Ref Range   SARS Coronavirus 2 by RT PCR NEGATIVE NEGATIVE    Comment: (NOTE) SARS-CoV-2 target nucleic acids are NOT DETECTED. The SARS-CoV-2 RNA is generally detectable in upper respiratoy specimens during the acute phase of infection. The lowest concentration of SARS-CoV-2 viral copies this assay can detect is 131 copies/mL. A negative result does not preclude SARS-Cov-2 infection and should not be used as the sole basis for treatment or other patient management decisions. A negative result may occur with  improper specimen collection/handling, submission of specimen other than nasopharyngeal swab, presence of viral mutation(s) within the areas targeted by this assay, and inadequate number of viral copies (<131 copies/mL). A negative result must be combined with clinical observations, patient history, and epidemiological information. The expected result is Negative. Fact Sheet for Patients:  PinkCheek.be Fact Sheet for Healthcare Providers:  GravelBags.it This test is not yet ap proved or cleared by the [place] FDA and   has been authorized for detection and/or diagnosis of SARS-CoV-2 by FDA under an Emergency Use Authorization (EUA). This EUA will remain  in effect (meaning this test can be used) for the duration of the COVID-19 declaration under Section 564(b)(1) of the Act, 21 U.S.C. section 360bbb-3(b)(1), unless the authorization is terminated or revoked sooner.    Influenza A by PCR NEGATIVE NEGATIVE   Influenza B by PCR NEGATIVE NEGATIVE    Comment: (NOTE) The Xpert Xpress SARS-CoV-2/FLU/RSV assay is intended as an aid in  the diagnosis of influenza from Nasopharyngeal swab specimens and  should not be used as a sole basis for treatment. Nasal washings and  aspirates are unacceptable for Xpert Xpress SARS-CoV-2/FLU/RSV  testing. Fact Sheet for Patients: PinkCheek.be Fact Sheet for Healthcare Providers: GravelBags.it This test is not yet approved or cleared by the [place] FDA and  has been authorized for detection and/or diagnosis of SARS-CoV-2 by  FDA under an Emergency Use Authorization (EUA). This EUA will remain  in effect (meaning this test can be used) for the duration of the  Covid-19 declaration under Section 564(b)(1) of the Act, 21  U.S.C. section 360bbb-3(b)(1), unless the authorization is  terminated or revoked. Performed at [facility], [address] [place], [place] [address]   Hemoglobin A1c     Status: Abnormal   Collection Time: 08/19/19  7:09 PM  Result Value Ref Range   Hgb A1c MFr Bld 6.4 (H) 4.8 - 5.6 %    Comment: (NOTE) Pre diabetes:          5.7%-6.4% Diabetes:              >6.4% Glycemic control for   <7.0% adults with diabetes    Mean Plasma Glucose 136.98 mg/dL    Comment: Performed at [facility] [address] [place], [place] [address]  HIV Antibody (  routine testing w rflx)     Status: None   Collection Time: 08/19/19  7:09 PM  Result Value Ref Range   HIV Screen 4th  Generation wRfx Non Reactive Non Reactive    Comment: Performed at [facility], [address] [place], [place] [address]  Troponin I (High Sensitivity)     Status: Abnormal   Collection Time: 08/19/19  7:09 PM  Result Value Ref Range   Troponin I (High Sensitivity) 233 (HH) <18 ng/L    Comment: CRITICAL VALUE NOTED. VALUE IS CONSISTENT WITH PREVIOUSLY REPORTED/CALLED VALUE [place] (NOTE) Elevated high sensitivity troponin I (hsTnI) values and significant  changes across serial measurements may suggest ACS but many other  chronic and acute conditions are known to elevate hsTnI results.  Refer to the "Links" section for chest pain algorithms and additional  guidance. Performed at [facility], [address] [place], [place] [address]   Troponin I (High Sensitivity)     Status: Abnormal   Collection Time: 08/19/19  9:25 PM  Result Value Ref Range   Troponin I (High Sensitivity) 209 (HH) <18 ng/L    Comment: CRITICAL VALUE NOTED. VALUE IS CONSISTENT WITH PREVIOUSLY REPORTED/CALLED VALUE [place] (NOTE) Elevated high sensitivity troponin I (hsTnI) values and significant  changes across serial measurements may suggest ACS but many other  chronic and acute conditions are known to elevate hsTnI results.  Refer to the "Links" section for chest pain algorithms and additional  guidance. Performed at [facility], [address] [place], [place] [address]   Glucose, capillary     Status: Abnormal   Collection Time: 08/20/19 12:00 AM  Result Value Ref Range   Glucose-Capillary 106 (H) 70 - 99 mg/dL    Comment: Glucose reference range applies only to samples taken after fasting for at least 8 hours.  Glucose, capillary     Status: Abnormal   Collection Time: 08/20/19  4:20 AM  Result Value Ref Range   Glucose-Capillary 120 (H) 70 - 99 mg/dL    Comment: Glucose reference range applies only to samples taken after fasting for at least 8 hours.  Basic metabolic panel      Status: Abnormal   Collection Time: 08/20/19  4:59 AM  Result Value Ref Range   Sodium 134 (L) 135 - 145 mmol/L   Potassium 4.2 3.5 - 5.1 mmol/L   Chloride 93 (L) 98 - 111 mmol/L   CO2 32 22 - 32 mmol/L   Glucose, Bld 138 (H) 70 - 99 mg/dL    Comment: Glucose reference range applies only to samples taken after fasting for at least 8 hours.   BUN 26 (H) 8 - 23 mg/dL   Creatinine, Ser 0.49 0.44 - 1.00 mg/dL   Calcium 8.7 (L) 8.9 - 10.3 mg/dL   GFR calc non Af Amer >60 >60 mL/min   GFR calc Af Amer >60 >60 mL/min   Anion gap 9 5 - 15    Comment: Performed at [facility], [address] [place], [place] [address]  CBC     Status: Abnormal   Collection Time: 08/20/19  4:59 AM  Result Value Ref Range   WBC 5.7 4.0 - 10.5 K/uL   RBC 4.17 3.87 - 5.11 MIL/uL   Hemoglobin 11.6 (L) 12.0 - 15.0 g/dL   HCT 36.4 36.0 - 46.0 %   MCV 87.3 80.0 - 100.0 fL   MCH 27.8 26.0 - 34.0 pg   MCHC 31.9 30.0 -  36.0 g/dL   RDW 13.2 11.5 - 15.5 %   Platelets 239 150 - 400 K/uL   nRBC 0.0 0.0 - 0.2 %    Comment: Performed at [facility], [address] [place], [place] [address]  Glucose, capillary     Status: Abnormal   Collection Time: 08/20/19  7:39 AM  Result Value Ref Range   Glucose-Capillary 145 (H) 70 - 99 mg/dL    Comment: Glucose reference range applies only to samples taken after fasting for at least 8 hours.  Blood gas, arterial     Status: Abnormal   Collection Time: 08/20/19 10:30 AM  Result Value Ref Range   FIO2 36.00    Delivery systems NASAL CANNULA    pH, Arterial 7.25 (L) 7.350 - 7.450   pCO2 arterial 99 (HH) 32.0 - 48.0 mmHg    Comment: CRITICAL RESULT CALLED TO, READ BACK BY AND VERIFIED WITH:  CRYSTAL GROGAN, RN, 08/20/19 AT 1244    pO2, Arterial 128 (H) 83.0 - 108.0 mmHg   Bicarbonate 43.4 (H) 20.0 - 28.0 mmol/L   Acid-Base Excess 11.8 (H) 0.0 - 2.0 mmol/L   O2 Saturation 98.4 %   Patient temperature 37.0    Collection site RIGHT RADIAL    Sample type  ARTHROGRAPHIS SPECIES    Allens test (pass/fail) POSITIVE (A) PASS    Comment: Performed at [facility], [address] [place], [place] [address]  Glucose, capillary     Status: Abnormal   Collection Time: 08/20/19 11:51 AM  Result Value Ref Range   Glucose-Capillary 159 (H) 70 - 99 mg/dL    Comment: Glucose reference range applies only to samples taken after fasting for at least 8 hours.  TSH     Status: None   Collection Time: 08/20/19  2:42 PM  Result Value Ref Range   TSH 0.419 0.350 - 4.500 uIU/mL    Comment: Performed by a 3rd Generation assay with a functional sensitivity of <=0.01 uIU/mL. Performed at [facility], [address] [place], [place] [address]   Magnesium     Status: None   Collection Time: 08/20/19  2:42 PM  Result Value Ref Range   Magnesium 1.9 1.7 - 2.4 mg/dL    Comment: Performed at [facility], [address] [place], [place] [address]  Phosphorus     Status: None   Collection Time: 08/20/19  2:42 PM  Result Value Ref Range   Phosphorus 2.8 2.5 - 4.6 mg/dL    Comment: Performed at [facility], [address] [place], [place] [address]  Glucose, capillary     Status: Abnormal   Collection Time: 08/20/19  4:31 PM  Result Value Ref Range   Glucose-Capillary 130 (H) 70 - 99 mg/dL    Comment: Glucose reference range applies only to samples taken after fasting for at least 8 hours.     DG Chest 1 View  Result Date: 08/19/2019 CLINICAL DATA:  Dyspnea EXAM: CHEST  1 VIEW COMPARISON:  02/25/2018 FINDINGS: Hyperinflation without focal opacity or pleural effusion. Normal cardiomediastinal silhouette with aortic atherosclerosis. No pneumothorax. IMPRESSION: No active disease. Electronically Signed   By: Donavan Foil M.D.   On: 08/19/2019 17:19   CT Head Wo Contrast  Result Date: 08/19/2019 CLINICAL DATA:  Encephalopathy, lethargic EXAM: CT HEAD WITHOUT CONTRAST TECHNIQUE: Contiguous axial images were obtained from  the base of the skull through the vertex without intravenous contrast. COMPARISON:  CT brain 02/08/2018 FINDINGS: Brain: No  acute territorial infarction, hemorrhage or intracranial mass is visualized. Mild atrophy and chronic small vessel ischemic change of the white matter. Moderate ventriculomegaly, similar compared to previous exam, disproportionate to degree of atrophy present. Vascular: No hyperdense vessels.  Carotid vascular calcification. Skull: Normal. Negative for fracture or focal lesion. Sinuses/Orbits: No acute finding. Other: None IMPRESSION: 1. Negative for acute intracranial hemorrhage or acute interval change as compared with 02/08/2018 2. 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 Mild chronic small vessel ischemic change of the white matter Electronically Signed   By: Donavan Foil M.D.   On: 08/19/2019 17:05   MR BRAIN WO CONTRAST  Result Date: 08/20/2019 CLINICAL DATA:  Encephalopathy. Additional provided: 69 year old female with history of COPD, diabetes, hypertension, vertigo presenting with altered mental status. EXAM: MRI HEAD WITHOUT CONTRAST TECHNIQUE: Multiplanar, multiecho pulse sequences of the brain and surrounding structures were obtained without intravenous contrast. COMPARISON:  Head CT 08/19/2019. FINDINGS: Brain: Intermittently motion degraded examination. Unchanged prior examination, there is ventriculomegaly which is out of proportion to the degree of parenchymal volume loss and findings are suspicious for communicating/normal pressure hydrocephalus. Mild multifocal T2/FLAIR hyperintensity within the cerebral white matter is nonspecific, but consistent with chronic small vessel ischemic disease. There is no acute infarct. No evidence of intracranial mass. No chronic intracranial blood products. No extra-axial fluid collection. No midline shift. Partially empty sella  turcica. Vascular: Expected proximal arterial flow voids. Skull and upper cervical spine: No focal marrow lesion identified within the limitations of motion degradation. Sinuses/Orbits: Visualized orbits show no acute finding. Mild ethmoid sinus mucosal thickening. Bilateral mastoid effusions IMPRESSION: 1. Intermittently motion degraded exam. 2. No evidence of acute infarct. 3. Unchanged ventriculomegaly which appears out of proportion to the degree of generalized parenchymal volume loss, and findings again raise suspicion for communicating/normal pressure hydrocephalus. 4. Mild generalized parenchymal atrophy and chronic small vessel ischemic disease. 5. Mild ethmoid sinus mucosal thickening. 6. Bilateral mastoid effusions. Electronically Signed   By: Kellie Simmering DO   On: 08/20/2019 13:42    Review of Systems  Unable to obtain from patient, she is confused and on BiPAP.  Blood pressure 131/62, pulse 77, temperature 98.1 F (36.7 C), resp. rate 16, height 5\' 2"  (1.575 m), weight 61.2 kg, SpO2 93 %. Physical Exam GENERAL: Frail and pale appearing woman, has asterixis, she is on BiPAP, using accessories of respiration (this appears chronic) otherwise no acute distress. HEAD: Normocephalic, atraumatic.  EYES: Pupils equal, round, reactive to light.  No scleral icterus.  MOUTH: Unable to examine fully she is on BiPAP. NECK: Supple. No thyromegaly.  Trachea midline. No JVD.  PULMONARY: Distant breath sounds, coarse, no rales or rhonchi, faint end expiratory wheezes. CARDIOVASCULAR: S1 and S2. Regular rate and rhythm.  No rubs murmurs gallops heard.  GASTROINTESTINAL: Nondistended abdomen, normoactive bowel sounds, no tenderness.  No masses. MUSCULOSKELETAL: No joint deformity, no clubbing, no edema.  NEUROLOGIC: + Asterixis, no overt focal deficit.  Confused. SKIN: Pale, intact,warm,dry. PSYCH: Flat affect.   Assessment/Plan:  Acute on chronic respiratory failure Very severe COPD stage IV by  GOLD criteria BiPAP and O2 dependent She is on BiPAP for chronic respiratory failure not for OSA Appears to have had worsening CO2 retention due to inability to use BiPAP She should be able to get her equipment masks as needed Recommend obtaining arterial blood gas in full compensated state Continue oxygen supplementation to keep saturations at 88 to  92% Mandatory BiPAP during periods of rest/naps and during nighttime sleep   Very severe COPD stage IV by GOLD criteria No apparent overt exacerbation DuoNeb's 4 times a day Pulmonary toilet No need for IV steroids or antibiotics  Acute encephalopathy Likely due to hypercarbia CT head concerning for potential NPH (normal pressure hydrocephalus) Reviewing outpatient records she has been having no issues with urinary incontinence NPH needs to be considered and will likely need evaluation by neurology   I initiated discussion of end-of-life issues and goals of care with the patient's daughter.  She appeared very reluctant to engage in this discussion however I reminded her that her mother is in very end-stage of her disease and this should be considered an unknown crisis time.  The patient may benefit from Palliative Care consultation.   Thank you for allowing me to participate in this patient's care.  Will follow along with you.  I discussed the case with Dr. Reesa Chew.     Renold Don, MD [place] [facility] 08/20/2019, 4:59 PM    *This note was dictated using voice recognition software/Dragon.  Despite best efforts to proofread, errors can occur which can change the meaning.  Any change was purely unintentional.

## 2019-08-21 ENCOUNTER — Inpatient Hospital Stay (HOSPITAL_COMMUNITY)
Admit: 2019-08-21 | Discharge: 2019-08-21 | Disposition: A | Discharge status: Home / Self Care | Payer: Medicare Other | Attending: Pulmonary Disease | Admitting: Pulmonary Disease

## 2019-08-21 DIAGNOSIS — I2729 Other secondary pulmonary hypertension: Secondary | ICD-10-CM

## 2019-08-21 LAB — BASIC METABOLIC PANEL
Anion gap: 9 (ref 5–15)
BUN: 21 mg/dL (ref 8–23)
CO2: 34 mmol/L — ABNORMAL HIGH (ref 22–32)
Calcium: 8.8 mg/dL — ABNORMAL LOW (ref 8.9–10.3)
Chloride: 92 mmol/L — ABNORMAL LOW (ref 98–111)
Creatinine, Ser: 0.35 mg/dL — ABNORMAL LOW (ref 0.44–1.00)
GFR calc Af Amer: >60 mL/min (ref >60)
GFR calc non Af Amer: >60 mL/min (ref >60)
Glucose, Bld: 105 mg/dL — ABNORMAL HIGH (ref 70–99)
Potassium: 3.9 mmol/L (ref 3.5–5.1)
Sodium: 135 mmol/L (ref 135–145)

## 2019-08-21 LAB — BLOOD GAS, ARTERIAL
Acid-Base Excess: 13.4 mmol/L — ABNORMAL HIGH (ref 0.0–2.0)
Bicarbonate: 40.5 mmol/L — ABNORMAL HIGH (ref 20.0–28.0)
Delivery systems: POSITIVE
Expiratory PAP: 5
FIO2: 0.35
Inspiratory PAP: 16
O2 Saturation: 96.7 %
Patient temperature: 37
pCO2 arterial: 61 mmHg — ABNORMAL HIGH (ref 32.0–48.0)
pH, Arterial: 7.43 (ref 7.350–7.450)
pO2, Arterial: 85 mmHg (ref 83.0–108.0)

## 2019-08-21 LAB — GLUCOSE, CAPILLARY
Glucose-Capillary: 101 mg/dL — ABNORMAL HIGH (ref 70–99)
Glucose-Capillary: 117 mg/dL — ABNORMAL HIGH (ref 70–99)
Glucose-Capillary: 139 mg/dL — ABNORMAL HIGH (ref 70–99)
Glucose-Capillary: 179 mg/dL — ABNORMAL HIGH (ref 70–99)
Glucose-Capillary: 199 mg/dL — ABNORMAL HIGH (ref 70–99)
Glucose-Capillary: 99 mg/dL (ref 70–99)

## 2019-08-21 MED ORDER — METOPROLOL TARTRATE 25 MG PO TABS
25.0000 mg | ORAL_TABLET | Freq: Two times a day (BID) | ORAL | Status: DC
  Administered 2019-08-21 – 2019-08-22 (×2): 25 mg via ORAL
  Filled 2019-08-21 (×2): qty 1

## 2019-08-21 NOTE — Progress Notes (Signed)
PROGRESS NOTE    Belinda Jenkins  [id number] DOB: 1951-02-05 DOA: 08/19/2019 PCP: Adin Hector, MD   Brief Narrative:   Belinda Jenkins is a 69 y.o. female with medical history significant of COPD on 2-2.5 L at home, diabetes, hypertension and vertigo was brought to ED with altered mental status.  Per daughter patient has been lying in bed for past 2 days and seems lethargic.  Apparently patient was not using her BiPAP at night due to some problem with the mask, her mask broke approximately a week ago.  They contacted the supply company themselves and also through her pulmonologist for a new mask which was declined stating that she needs a new sleep study?? Found to be hypoxic and hypercarbic initially, placed on BiPAP overnight, hypoxia improved after weaning her off from BiPAP, remained little confused and having some word finding difficulties, repeat ABG with persistent CO2 of 99.  Subjective: Patient was feeling better when seen today.  She was able to communicate well.  She thinks that is almost back to her baseline although is still feeling little weak.  Assessment & Plan:   Active Problems:   Acute on chronic respiratory failure with hypoxia and hypercapnia (HCC)   Acute on chronic respiratory failure with hypercapnia (HCC)  Acute on chronic hypoxic and hypercarbic respiratory failure.  Mental status improved today with continuous BiPAP.  On repeat ABG this morning she has well compensated hypercapnia with CO2 of 61. Pulmonary was consulted-appreciate their recommendations. -TSH, magnesium and phosphorus levels were normal. -Continue with intermittent BiPAP during the day and continuous during the night. -Patient needs a functional BiPAP before going home to prevent rehospitalization.  She needs BiPAP for respiratory failure not for obstructive sleep apnea.  Normal pressure hydrocephalus.  CT head and later MRI done yesterday shows widened ventricle, more consistent  with NPH.  Patient with generalized lower extremity weakness and occasionally being incontinent. Discussed with patient and her daughter that she needs a neurologic evaluation as an outpatient. -We will provide referral for outpatient neurology on discharge.  Elevated troponin.  Initial troponin at 217>>207.  No chest pain.  Most likely secondary to demand as patient was hypoxic and hypercapnic. -Continue to monitor.  Type 2 diabetes mellitus.  Seems well controlled on Metformin. -Recheck A1c- 6.4 -SSI  Hypertension.   Blood pressure elevated this morning. -Increase the dose of Lopressor to 25 milligrams twice daily. -Continue home dose of valsartan. -Continue to monitor. -IV labetalol as needed.  Objective: Vitals:   08/21/19 0740 08/21/19 0751 08/21/19 1121 08/21/19 1203  BP:  (!) 189/87  (!) 179/99  Pulse:  98  95  Resp:  17  18  Temp:  98 F (36.7 C)  98 F (36.7 C)  TempSrc:  Oral    SpO2: 95% 95% 95% 99%  Weight:      Height:        Intake/Output Summary (Last 24 hours) at 08/21/2019 1225 Last data filed at 08/21/2019 0945 Gross per 24 hour  Intake 1740 ml  Output 1000 ml  Net 740 ml   Filed Weights   08/19/19 1713  Weight: 61.2 kg    Examination:  General exam: Appears calm and comfortable  Respiratory system: Clear to auscultation. Respiratory effort normal. Cardiovascular system: S1 & S2 heard, RRR. No JVD, murmurs, rubs, gallops or clicks. Gastrointestinal system: Soft, nontender, nondistended, bowel sounds positive. Central nervous system: Alert and oriented. No focal neurological deficits. Extremities: No edema, no cyanosis, pulses  intact and symmetrical. Skin: No rashes, lesions or ulcers Psychiatry: Judgement and insight appear normal.   DVT prophylaxis: Lovenox Code Status: Full Family Communication: Daughter was updated on phone. Disposition Plan:  Status is: Inpatient  Remains inpatient appropriate because:Inpatient level of care appropriate  due to severity of illness   Dispo: The patient is from: Home              Anticipated d/c is to: Home              Anticipated d/c date is: 1 day.              Patient currently is not medically stable to d/c.  Patient came with acute on chronic hypoxic and hypercarbic respiratory failure secondary to malfunctioning home BiPAP mask.  Patient will need functional BiPAP before going home, we will try getting her a new BiPAP tomorrow.  Consultants:   Pulmonary  Procedures:  Antimicrobials:   Data Reviewed: I have personally reviewed following labs and imaging studies  CBC: Recent Labs  Lab 08/19/19 1633 08/20/19 0459  WBC 9.7 5.7  NEUTROABS 8.4*  --   HGB 13.3 11.6*  HCT 44.1 36.4  MCV 90.4 87.3  PLT 304 [id number]   Basic Metabolic Panel: Recent Labs  Lab 08/19/19 1633 08/20/19 0459 08/20/19 1442 08/21/19 0531  NA 133* 134*  --  135  K 4.9 4.2  --  3.9  CL 86* 93*  --  92*  CO2 30 32  --  34*  GLUCOSE 186* 138*  --  105*  BUN 24* 26*  --  21  CREATININE 0.68 0.49  --  0.35*  CALCIUM 9.5 8.7*  --  8.8*  MG  --   --  1.9  --   PHOS  --   --  2.8  --    GFR: Estimated Creatinine Clearance: 57.1 mL/min (A) (by C-G formula based on SCr of 0.35 mg/dL (L)). Liver Function Tests: Recent Labs  Lab 08/19/19 1633  AST 24  ALT 13  ALKPHOS 53  BILITOT 1.8*  PROT 8.3*  ALBUMIN 5.0   No results for input(s): LIPASE, AMYLASE in the last 168 hours. No results for input(s): AMMONIA in the last 168 hours. Coagulation Profile: No results for input(s): INR, PROTIME in the last 168 hours. Cardiac Enzymes: No results for input(s): CKTOTAL, CKMB, CKMBINDEX, TROPONINI in the last 168 hours. BNP (last 3 results) No results for input(s): PROBNP in the last 8760 hours. HbA1C: Recent Labs    08/19/19 1909  HGBA1C 6.4*   CBG: Recent Labs  Lab 08/20/19 1956 08/20/19 2356 08/21/19 0411 08/21/19 0752 08/21/19 1203  GLUCAP 106* 131* 99 117* 199*   Lipid Profile: No results  for input(s): CHOL, HDL, LDLCALC, TRIG, CHOLHDL, LDLDIRECT in the last 72 hours. Thyroid Function Tests: Recent Labs    08/20/19 1442  TSH 0.419   Anemia Panel: No results for input(s): VITAMINB12, FOLATE, FERRITIN, TIBC, IRON, RETICCTPCT in the last 72 hours. Sepsis Labs: No results for input(s): PROCALCITON, LATICACIDVEN in the last 168 hours.  Recent Results (from the past 240 hour(s))  Respiratory Panel by RT PCR (Flu A&B, Covid) - Nasopharyngeal Swab     Status: None   Collection Time: 08/19/19  4:33 PM   Specimen: Nasopharyngeal Swab  Result Value Ref Range Status   SARS Coronavirus 2 by RT PCR NEGATIVE NEGATIVE Final    Comment: (NOTE) SARS-CoV-2 target nucleic acids are NOT DETECTED. The SARS-CoV-2 RNA is generally  detectable in upper respiratoy specimens during the acute phase of infection. The lowest concentration of SARS-CoV-2 viral copies this assay can detect is 131 copies/mL. A negative result does not preclude SARS-Cov-2 infection and should not be used as the sole basis for treatment or other patient management decisions. A negative result may occur with  improper specimen collection/handling, submission of specimen other than nasopharyngeal swab, presence of viral mutation(s) within the areas targeted by this assay, and inadequate number of viral copies (<131 copies/mL). A negative result must be combined with clinical observations, patient history, and epidemiological information. The expected result is Negative. Fact Sheet for Patients:  PinkCheek.be Fact Sheet for Healthcare Providers:  GravelBags.it This test is not yet ap proved or cleared by the [place] FDA and  has been authorized for detection and/or diagnosis of SARS-CoV-2 by FDA under an Emergency Use Authorization (EUA). This EUA will remain  in effect (meaning this test can be used) for the duration of the COVID-19 declaration under  Section 564(b)(1) of the Act, 21 U.S.C. section 360bbb-3(b)(1), unless the authorization is terminated or revoked sooner.    Influenza A by PCR NEGATIVE NEGATIVE Final   Influenza B by PCR NEGATIVE NEGATIVE Final    Comment: (NOTE) The Xpert Xpress SARS-CoV-2/FLU/RSV assay is intended as an aid in  the diagnosis of influenza from Nasopharyngeal swab specimens and  should not be used as a sole basis for treatment. Nasal washings and  aspirates are unacceptable for Xpert Xpress SARS-CoV-2/FLU/RSV  testing. Fact Sheet for Patients: PinkCheek.be Fact Sheet for Healthcare Providers: GravelBags.it This test is not yet approved or cleared by the [place] FDA and  has been authorized for detection and/or diagnosis of SARS-CoV-2 by  FDA under an Emergency Use Authorization (EUA). This EUA will remain  in effect (meaning this test can be used) for the duration of the  Covid-19 declaration under Section 564(b)(1) of the Act, 21  U.S.C. section 360bbb-3(b)(1), unless the authorization is  terminated or revoked. Performed at [facility], [address] [place], [place] [address]      Radiology Studies: DG Chest 1 View  Result Date: 08/19/2019 CLINICAL DATA:  Dyspnea EXAM: CHEST  1 VIEW COMPARISON:  02/25/2018 FINDINGS: Hyperinflation without focal opacity or pleural effusion. Normal cardiomediastinal silhouette with aortic atherosclerosis. No pneumothorax. IMPRESSION: No active disease. Electronically Signed   By: Donavan Foil M.D.   On: 08/19/2019 17:19   CT Head Wo Contrast  Result Date: 08/19/2019 CLINICAL DATA:  Encephalopathy, lethargic EXAM: CT HEAD WITHOUT CONTRAST TECHNIQUE: Contiguous axial images were obtained from the base of the skull through the vertex without intravenous contrast. COMPARISON:  CT brain 02/08/2018 FINDINGS: Brain: No acute territorial infarction, hemorrhage or intracranial mass is visualized.  Mild atrophy and chronic small vessel ischemic change of the white matter. Moderate ventriculomegaly, similar compared to previous exam, disproportionate to degree of atrophy present. Vascular: No hyperdense vessels.  Carotid vascular calcification. Skull: Normal. Negative for fracture or focal lesion. Sinuses/Orbits: No acute finding. Other: None IMPRESSION: 1. Negative for acute intracranial hemorrhage or acute interval change as compared with 02/08/2018 2. 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 Mild chronic small vessel ischemic change of the white matter Electronically Signed   By: Donavan Foil M.D.   On: 08/19/2019 17:05   MR BRAIN WO CONTRAST  Result Date: 08/20/2019 CLINICAL DATA:  Encephalopathy. Additional provided: 69 year old female with history of COPD,  diabetes, hypertension, vertigo presenting with altered mental status. EXAM: MRI HEAD WITHOUT CONTRAST TECHNIQUE: Multiplanar, multiecho pulse sequences of the brain and surrounding structures were obtained without intravenous contrast. COMPARISON:  Head CT 08/19/2019. FINDINGS: Brain: Intermittently motion degraded examination. Unchanged prior examination, there is ventriculomegaly which is out of proportion to the degree of parenchymal volume loss and findings are suspicious for communicating/normal pressure hydrocephalus. Mild multifocal T2/FLAIR hyperintensity within the cerebral white matter is nonspecific, but consistent with chronic small vessel ischemic disease. There is no acute infarct. No evidence of intracranial mass. No chronic intracranial blood products. No extra-axial fluid collection. No midline shift. Partially empty sella turcica. Vascular: Expected proximal arterial flow voids. Skull and upper cervical spine: No focal marrow lesion identified within the limitations of motion degradation. Sinuses/Orbits: Visualized orbits show  no acute finding. Mild ethmoid sinus mucosal thickening. Bilateral mastoid effusions IMPRESSION: 1. Intermittently motion degraded exam. 2. No evidence of acute infarct. 3. Unchanged ventriculomegaly which appears out of proportion to the degree of generalized parenchymal volume loss, and findings again raise suspicion for communicating/normal pressure hydrocephalus. 4. Mild generalized parenchymal atrophy and chronic small vessel ischemic disease. 5. Mild ethmoid sinus mucosal thickening. 6. Bilateral mastoid effusions. Electronically Signed   By: Kellie Simmering DO   On: 08/20/2019 13:42    Scheduled Meds: . budesonide  0.5 mg Nebulization BID  . enoxaparin (LOVENOX) injection  40 mg Subcutaneous Q24H  . insulin aspart  0-9 Units Subcutaneous Q4H  . ipratropium-albuterol  3 mL Nebulization QID  . irbesartan  150 mg Oral Daily  . metoprolol tartrate  12.5 mg Oral BID  . sodium chloride flush  3 mL Intravenous Q12H   Continuous Infusions:    LOS: 1 day   Time spent: 40 minutes.  Lorella Nimrod, MD [facility]  If 7PM-7AM, please contact night-coverage [email or web address]  08/21/2019, 12:25 PM   This record has been created using Systems analyst. Errors have been sought and corrected,but may not always be located. Such creation errors do not reflect on the standard of care.

## 2019-08-21 NOTE — Progress Notes (Signed)
*  PRELIMINARY RESULTS* Echocardiogram 2D Echocardiogram has been performed.  Jonette Mate Glorie Dowlen 08/21/2019, 2:22 PM

## 2019-08-22 ENCOUNTER — Other Ambulatory Visit: Payer: Self-pay

## 2019-08-22 DIAGNOSIS — G912 (Idiopathic) normal pressure hydrocephalus: Secondary | ICD-10-CM

## 2019-08-22 LAB — BLOOD GAS, ARTERIAL
Acid-Base Excess: 18.1 mmol/L — ABNORMAL HIGH (ref 0.0–2.0)
Bicarbonate: 45.5 mmol/L — ABNORMAL HIGH (ref 20.0–28.0)
Delivery systems: POSITIVE
Expiratory PAP: 5
FIO2: 0.3
Inspiratory PAP: 16
Mechanical Rate: 12
O2 Saturation: 96.8 %
Patient temperature: 37
pCO2 arterial: 64 mmHg — ABNORMAL HIGH (ref 32.0–48.0)
pH, Arterial: 7.46 — ABNORMAL HIGH (ref 7.350–7.450)
pO2, Arterial: 84 mmHg (ref 83.0–108.0)

## 2019-08-22 LAB — GLUCOSE, CAPILLARY
Glucose-Capillary: 129 mg/dL — ABNORMAL HIGH (ref 70–99)
Glucose-Capillary: 125 mg/dL — ABNORMAL HIGH (ref 70–99)
Glucose-Capillary: 109 mg/dL — ABNORMAL HIGH (ref 70–99)
Glucose-Capillary: 158 mg/dL — ABNORMAL HIGH (ref 70–99)

## 2019-08-22 LAB — BASIC METABOLIC PANEL
Anion gap: 8 (ref 5–15)
BUN: 15 mg/dL (ref 8–23)
CO2: 39 mmol/L — ABNORMAL HIGH (ref 22–32)
Calcium: 8.7 mg/dL — ABNORMAL LOW (ref 8.9–10.3)
Chloride: 90 mmol/L — ABNORMAL LOW (ref 98–111)
Creatinine, Ser: 0.37 mg/dL — ABNORMAL LOW (ref 0.44–1.00)
GFR calc Af Amer: >60 mL/min (ref >60)
GFR calc non Af Amer: >60 mL/min (ref >60)
Glucose, Bld: 121 mg/dL — ABNORMAL HIGH (ref 70–99)
Potassium: 3.4 mmol/L — ABNORMAL LOW (ref 3.5–5.1)
Sodium: 137 mmol/L (ref 135–145)

## 2019-08-22 LAB — ECHOCARDIOGRAM COMPLETE
Height: 62 in
Weight: 2158.74 oz

## 2019-08-22 MED ORDER — IRBESARTAN 150 MG PO TABS
300.0000 mg | ORAL_TABLET | Freq: Every day | ORAL | Status: DC
  Administered 2019-08-22: 300 mg via ORAL
  Filled 2019-08-22: qty 2

## 2019-08-22 MED ORDER — ENSURE ENLIVE PO LIQD: 237.0000 mL | Freq: Two times a day (BID) | ORAL | 12 refills | Status: AC

## 2019-08-22 MED ORDER — INSULIN ASPART 100 UNIT/ML ~~LOC~~ SOLN
0.0000 [IU] | Freq: Three times a day (TID) | SUBCUTANEOUS | Status: DC
  Administered 2019-08-22: 12:00:00 2 [IU] via SUBCUTANEOUS
  Filled 2019-08-22: qty 1

## 2019-08-22 MED ORDER — POTASSIUM CHLORIDE CRYS ER 20 MEQ PO TBCR
40.0000 meq | EXTENDED_RELEASE_TABLET | Freq: Once | ORAL | Status: AC
  Administered 2019-08-22: 12:00:00 40 meq via ORAL
  Filled 2019-08-22: qty 2

## 2019-08-22 MED ORDER — ENSURE ENLIVE PO LIQD: 237.0000 mL | Freq: Two times a day (BID) | ORAL | Status: DC

## 2019-08-22 MED ORDER — METOPROLOL TARTRATE 25 MG PO TABS: 25.0000 mg | ORAL_TABLET | Freq: Two times a day (BID) | ORAL | 1 refills | Status: AC

## 2019-08-22 NOTE — Progress Notes (Signed)
Patient alert and oriented, vss, no complaints of pain.  Patient to be escorted out of hsopital via wheelchair by nursing staff.  D/c telemetry and d/c piv.    Respiratory verified personal bipap equipment prior to discharge.

## 2019-08-22 NOTE — Progress Notes (Signed)
Initial Nutrition Assessment  DOCUMENTATION CODES:   Not applicable  INTERVENTION:   Ensure Enlive po BID, each supplement provides 350 kcal and 20 grams of protein  NUTRITION DIAGNOSIS:   Increased nutrient needs related to catabolic illness(COPD) as evidenced by increased estimated needs.  GOAL:   Patient will meet greater than or equal to 90% of their needs  MONITOR:   PO intake, Supplement acceptance, Labs, Weight trends, Skin, I & O's  REASON FOR ASSESSMENT:   Malnutrition Screening Tool    ASSESSMENT:   69 y.o. female with medical history significant of COPD on 2-2.5 L at home, diabetes, hypertension and vertigo was brought to ED with altered mental status.   Met with pt in room today. Pt reports fairly good appetite and oral intake pta and in hospital. Pt reports that she drinks chocolate Ensure at home. Per chart, pt is eating 25-75% of meals in hospital. RD will add supplements to help pt meet her estimated needs. Pt reports her weight is stable around 130lbs.  Medications reviewed and include: lovenox, insulin  Labs reviewed: K 3.4(L), creat 0.37(L) P 2.8 wnl, Mg 1.9 wnl- 5/8  NUTRITION - FOCUSED PHYSICAL EXAM:    Most Recent Value  Orbital Region  No depletion  Upper Arm Region  No depletion  Thoracic and Lumbar Region  No depletion  Buccal Region  No depletion  Temple Region  Moderate depletion  Clavicle Bone Region  No depletion  Clavicle and Acromion Bone Region  No depletion  Scapular Bone Region  No depletion  Dorsal Hand  No depletion  Patellar Region  Moderate depletion  Anterior Thigh Region  Mild depletion  Posterior Calf Region  Mild depletion  Edema (RD Assessment)  None  Hair  Reviewed  Eyes  Reviewed  Mouth  Reviewed  Skin  Reviewed  Nails  Reviewed     Diet Order:   Diet Order            Diet heart healthy/carb modified Room service appropriate? Yes; Fluid consistency: Thin  Diet effective now             EDUCATION NEEDS:    Education needs have been addressed  Skin:  Skin Assessment: Reviewed RN Assessment  Last BM:  5/9- type 4  Height:   Ht Readings from Last 1 Encounters:  08/19/19 5' 2"  (1.575 m)    Weight:   Wt Readings from Last 1 Encounters:  07/08/18 61.2 kg    Ideal Body Weight:  50 kg  BMI:  Body mass index is 24.68 kg/m.  Estimated Nutritional Needs:   Kcal:  1400-1600kcal/day  Protein:  70-80g/day  Fluid:  >1.3L/day  Koleen Distance MS, RD, LDN Please refer to [facility] for RD and/or RD on-call/weekend/after hours pager

## 2019-08-22 NOTE — Plan of Care (Signed)
  Problem: Clinical Measurements: Goal: Respiratory complications will improve Outcome: Progressing   Problem: Nutrition: Goal: Adequate nutrition will be maintained Outcome: Progressing   Problem: Safety: Goal: Ability to remain free from injury will improve Outcome: Progressing   

## 2019-08-22 NOTE — Care Plan (Signed)
  Attestation for need of BiPAP for chronic respiratory failure    -Patient has very severe COPD with chronic hypercarbic respiratory failure. Patient needs NIV therapy, Patient  has a history of life threatening hospitalizations resulting from her severe COPD -This patient requires access to BiPAP therapy 24 hrs/day. Patient will need daily use at night and during resting hours of the day. -If patient does not receive her BiPAP or ancillary BiPAP equipment, patient will likely succumb to severe injury and/or death from increased levels of carbon dioxide     Patient continues to exhibit signs of hypercapnia associated with chronic respiratory failure management with oxygen alone or bronchodilators will not correct her hypercapnia.  Patient requires the use of non-invasive ventilation both nightly and daytime to help with exacerbation periods.    Her arterial blood gases IN A COMPENSATED STATE are as follows:     Component Value Date/Time   PHART 7.46 (H) 08/22/2019 0500   PCO2ART 64 (H) 08/22/2019 0500   PO2ART 84 08/22/2019 0500   HCO3 45.5 (H) 08/22/2019 0500   O2SAT 96.8 08/22/2019 0500     The use of the BiPAP will treat patient's high PCO2 levels and can reduce risk of exacerbations in future hospitalizations when used at night and during the day.  Patient will need these advanced settings in conjunction with her current medication regimen.   Patient can maintain and clear their own airway.   She should have unrestricted access to required equipment such as tubing/mask/humidifiers for her BiPAP equipment.   Renold Don, MD [facility]

## 2019-08-22 NOTE — Progress Notes (Signed)
Unable to weigh pt. Hoyer lift is not available at this time.

## 2019-08-22 NOTE — Evaluation (Signed)
Physical Therapy Evaluation Patient Details Name: Belinda Jenkins MRN: [id number] DOB: 12-02-1950 Today's Date: 08/22/2019   History of Present Illness  Pt is a 69 y.o. female with medical history significant of COPD on 2-2.5 L at home, diabetes, hypertension and vertigo was brought to ED with altered mental status.  Per daughter patient has been lying in bed for past 2 days and seems lethargic.  Apparently patient was not using her BiPAP at night due to some problem with the mask, her mask broke approximately a week ago. Admitted for acute on chronic respiratory failure, elevated troponins attributed to demand ischemia.    Clinical Impression  Pt alert, in bed, reported that she was okay today, no pain. At baseline per pt, she ambulates occasionally with a quad cane, independent for ADLs, does not drive. Husband available 2/47, and denies any falls in the last 6 months.  The patient demonstrated bed mobility mod I, extended time and use of bed rails noted. Sit <> stand from EOB with handheld assist, pt reached for external support for BUE, but able to take several steps to recliner in the room. Pt endorsed dizziness initially upon sitting that resolved with time, and dizziness once she was up in the chair. Further ambulation attempted, CGA with RW to stand, but pt unable to tolerate due to dizziness. RN notified of potential need to assess for orthostatics, further mobility deferred due to pt request/dizziness/SOB. SpO2 on 3L >90% throughout.  Overall the patient demonstrated deficits (see "PT Problem List") that impede the patient's functional abilities, safety, and mobility and would benefit from skilled PT intervention. Recommendation is HHPT with supervision/assistance 24/7.      Follow Up Recommendations Home health PT;Supervision/Assistance - 24 hour    Equipment Recommendations  3in1 (PT);Other (comment)(pt has RW at home)    Recommendations for Other Services       Precautions /  Restrictions Precautions Precautions: Fall Restrictions Weight Bearing Restrictions: No      Mobility  Bed Mobility Overal bed mobility: Modified Independent             General bed mobility comments: extended time, use of bedrails  Transfers Overall transfer level: Needs assistance Equipment used: Rolling walker (2 wheeled);1 person hand held assist Transfers: Sit to/from Stand           General transfer comment: handheld assist, pt reached for external support bilaterally. Performed with RW CGA.  Ambulation/Gait Ambulation/Gait assistance: Min guard Gait Distance (Feet): 3 Feet Assistive device: 1 person hand held assist       General Gait Details: Pt able to take several steps to recliner in room with handheld assist, reached for external support bilaterally. Re-attempted to ambulate, pt endorsed significant dizziness with standing, further ambulation deferred at this time.  Stairs            Wheelchair Mobility    Modified Rankin (Stroke Patients Only)       Balance Overall balance assessment: Needs assistance Sitting-balance support: Feet supported Sitting balance-Leahy Scale: Good     Standing balance support: Bilateral upper extremity supported Standing balance-Leahy Scale: Fair Standing balance comment: reliant on UE support for functional tasks/ambulation                             Pertinent Vitals/Pain Pain Assessment: No/denies pain    Home Living Family/patient expects to be discharged to:: Private residence Living Arrangements: Spouse/significant other Available Help at Discharge: Family;Available 24  hours/day Type of Home: House Home Access: Stairs to enter Entrance Stairs-Rails: None Entrance Stairs-Number of Steps: 1 Home Layout: One level Home Equipment: Walker - 2 wheels;Cane - quad Additional Comments: denies any recent falls    Prior Function Level of Independence: Independent with assistive device(s)          Comments: occasional use of SPC     Hand Dominance   Dominant Hand: Right    Extremity/Trunk Assessment   Upper Extremity Assessment Upper Extremity Assessment: Generalized weakness    Lower Extremity Assessment Lower Extremity Assessment: Generalized weakness    Cervical / Trunk Assessment Cervical / Trunk Assessment: Normal  Communication   Communication: No difficulties  Cognition Arousal/Alertness: Awake/alert Behavior During Therapy: WFL for tasks assessed/performed Overall Cognitive Status: Within Functional Limits for tasks assessed                                        General Comments      Exercises     Assessment/Plan    PT Assessment Patient needs continued PT services  PT Problem List Decreased strength;Decreased mobility;Decreased activity tolerance;Decreased balance;Decreased knowledge of use of DME       PT Treatment Interventions Therapeutic exercise;DME instruction;Gait training;Balance training;Stair training;Neuromuscular re-education;Functional mobility training;Therapeutic activities;Patient/family education    PT Goals (Current goals can be found in the Care Plan section)  Acute Rehab PT Goals Patient Stated Goal: to go home PT Goal Formulation: With patient Time For Goal Achievement: 09/05/19 Potential to Achieve Goals: Good    Frequency Min 2X/week   Barriers to discharge        Co-evaluation               AM-PAC PT "6 Clicks" Mobility  Outcome Measure Help needed turning from your back to your side while in a flat bed without using bedrails?: A Little Help needed moving from lying on your back to sitting on the side of a flat bed without using bedrails?: A Little Help needed moving to and from a bed to a chair (including a wheelchair)?: A Little Help needed standing up from a chair using your arms (e.g., wheelchair or bedside chair)?: A Little Help needed to walk in hospital room?: A Little Help  needed climbing 3-5 steps with a railing? : A Lot 6 Click Score: 17    End of Session Equipment Utilized During Treatment: Gait belt;Oxygen(3L) Activity Tolerance: Other (comment)(Session limited by pt dizziness) Patient left: in chair;with chair alarm set;with call bell/phone within reach Nurse Communication: Mobility status;Other (comment)(potential need for orthostatic assessment) PT Visit Diagnosis: Other abnormalities of gait and mobility (R26.89);Muscle weakness (generalized) (M62.81);Difficulty in walking, not elsewhere classified (R26.2)    Time: [id number] PT Time Calculation (min) (ACUTE ONLY): 28 min   Charges:   PT Evaluation $PT Eval Moderate Complexity: 1 Mod PT Treatments $Therapeutic Activity: 8-22 mins       Lieutenant Diego PT, DPT 10:30 AM,08/22/19

## 2019-08-22 NOTE — Discharge Summary (Signed)
Physician Discharge Summary  Belinda Jenkins [id number] DOB: [birth year] DOA: 08/19/2019  PCP: Adin Hector, MD  Admit date: 08/19/2019 Discharge date: 08/22/2019  Admitted From: Home Disposition:  Home  Recommendations for Outpatient Follow-up:  1. Follow up with PCP in 1-2 weeks 2. Follow-up with neurology 3. Follow-up with pulmonology 4. Please obtain BMP/CBC in one week 5. Please follow up on the following pending results: None  Home Health: Yes Equipment/Devices: Home oxygen, BiPAP Discharge Condition: Stable CODE STATUS: Full Diet recommendation: Heart Healthy / Carb Modified   Brief/Interim Summary: Belinda Jenkins a 69 y.o.femalewith medical history significant ofCOPD on 2-2.5L at home,diabetes, hypertension and vertigo was brought to ED with altered mental status. Per daughter patient has been lying in bed for past 2days and seems lethargic. Apparently patient was not using her BiPAP at night due to some problem with the mask,her mask broke approximately a week ago. They contacted the supply company themselves and also through her pulmonologist for a new mask which was declined stating that she needs a new sleep study?? Found to be hypoxic and hypercarbic initially, placed on BiPAP overnight, hypoxia improved after weaning her off from BiPAP, remained little confused and having some word finding difficulties, repeat ABG with persistent CO2 of 99.  MRI also obtained which was negative for any infarct but shows ventriculomegaly, more consistent with normal pressure hydrocephalus which needs to be evaluated by a neurologist as an outpatient.  Referral was provided.  After using BiPAP most of the time for more than 24-hour her mental status improved and she appears to be back to her base line.  She was able to communicate well and feed herself.  PT ABGs with normal pH and CO2 of 61, appears well compensated. Patient has BiPAP dependent respiratory failure and  needs BiPAP for her respiratory failure not for obstructive sleep apnea which requires sleep study. There should not be any need for sleep studies done in a patient with BiPAP dependent respiratory failure. We spoke with her company and able to get a new mask for her.  She was advised to use BiPAP at night and during the day while resting.  She will follow-up with her pulmonologist.  She did had mildly elevated troponin which were trending down, no chest pain, most likely secondary to demand as patient was hypoxic and hypercapnic.  We increased the dose of metoprolol from 12.5 to 25 mg twice daily as her blood pressure and heart rate was mildly elevated.  She will continue home dose of valsartan and follow-up with primary care physician for further management of her blood pressure and diabetes.   Discharge Diagnoses:  Active Problems:   Acute on chronic respiratory failure with hypoxia and hypercapnia (HCC)   Acute on chronic respiratory failure with hypercapnia (HCC)   NPH (normal pressure hydrocephalus) [facility])   Discharge Instructions  Discharge Instructions    Ambulatory referral to Neurology   Complete by: As directed    To evaluate for normal pressure hydrocephalus   Diet - low sodium heart healthy   Complete by: As directed    Discharge instructions   Complete by: As directed    It was pleasure taking care of you. We increased the dose of metoprolol from 12.5 mg to 25 mg twice daily.  Your blood pressure was little elevated that should help with that.  Please follow-up with your primary care physician for further management. We also referred you to see a neurologist. You need a close  follow-up with your pulmonologist for further management of your lung condition.  Use your oxygen all the time and use BiPAP whenever resting during the day and night.   Increase activity slowly   Complete by: As directed      Allergies as of 08/22/2019      Reactions    Sulfamethoxazole-trimethoprim Other (See Comments)   Sulfa causes tingling   Morphine    Other reaction(s): Other (See Comments) Morphine causes mind altering.   Oxycodone-acetaminophen    Other reaction(s): Other (See Comments) Percocet causes mind altering.   Sertraline Other (See Comments)   Sertraline causes worsening depression.   Adhesive [tape] Rash   Some bandaids cause rash      Medication List    TAKE these medications   alendronate 70 MG tablet Commonly known as: FOSAMAX Take 70 mg by mouth once a week.   ALPRAZolam 0.25 MG tablet Commonly known as: XANAX Take 2 tablets (0.5 mg total) by mouth 2 (two) times daily as needed for anxiety.   budesonide 0.5 MG/2ML nebulizer solution Commonly known as: PULMICORT Take 2 mLs (0.5 mg total) by nebulization 2 (two) times daily. DX:J44.9   feeding supplement (ENSURE ENLIVE) Liqd Take 237 mLs by mouth 2 (two) times daily between meals.   ipratropium-albuterol 0.5-2.5 (3) MG/3ML Soln Commonly known as: DUONEB Take 3 mLs by nebulization.   metFORMIN 1000 MG tablet Commonly known as: GLUCOPHAGE Take 1,000 mg by mouth 2 (two) times daily with a meal.   metoprolol tartrate 25 MG tablet Commonly known as: LOPRESSOR Take 1 tablet (25 mg total) by mouth 2 (two) times daily. What changed: how much to take   valsartan 160 MG tablet Commonly known as: DIOVAN Take 160 mg by mouth daily.      Follow-up Information    Vladimir Crofts, MD. Schedule an appointment as soon as possible for a visit.   Specialty: Neurology Contact information: [facility] [place] [address] [phone]        Adin Hector, MD Follow up.   Specialty: Internal Medicine Contact information: [address] [facility] [place] [address] [phone]          Allergies  Allergen Reactions  . Sulfamethoxazole-Trimethoprim Other (See Comments)    Sulfa causes tingling   . Morphine     Other reaction(s): Other (See Comments) Morphine causes mind altering.  . Oxycodone-Acetaminophen     Other reaction(s): Other (See Comments) Percocet causes mind altering.  . Sertraline Other (See Comments)    Sertraline causes worsening depression.  . Adhesive [Tape] Rash    Some bandaids cause rash    Consultations:  Pulmonary  Procedures/Studies: DG Chest 1 View  Result Date: 08/19/2019 CLINICAL DATA:  Dyspnea EXAM: CHEST  1 VIEW COMPARISON:  02/25/2018 FINDINGS: Hyperinflation without focal opacity or pleural effusion. Normal cardiomediastinal silhouette with aortic atherosclerosis. No pneumothorax. IMPRESSION: No active disease. Electronically Signed   By: Donavan Foil M.D.   On: 08/19/2019 17:19   CT Head Wo Contrast  Result Date: 08/19/2019 CLINICAL DATA:  Encephalopathy, lethargic EXAM: CT HEAD WITHOUT CONTRAST TECHNIQUE: Contiguous axial images were obtained from the base of the skull through the vertex without intravenous contrast. COMPARISON:  CT brain 02/08/2018 FINDINGS: Brain: No acute territorial infarction, hemorrhage or intracranial mass is visualized. Mild atrophy and chronic small vessel ischemic change of the white matter. Moderate ventriculomegaly, similar compared to previous exam, disproportionate to degree of atrophy present. Vascular:  No hyperdense vessels.  Carotid vascular calcification. Skull: Normal. Negative for fracture or focal lesion. Sinuses/Orbits: No acute finding. Other: None IMPRESSION: 1. Negative for acute intracranial hemorrhage or acute interval change as compared with 02/08/2018 2. 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 Mild chronic small vessel ischemic change of the white matter Electronically Signed   By: Donavan Foil M.D.   On: 08/19/2019 17:05   MR BRAIN WO CONTRAST  Result Date: 08/20/2019 CLINICAL DATA:   Encephalopathy. Additional provided: 69 year old female with history of COPD, diabetes, hypertension, vertigo presenting with altered mental status. EXAM: MRI HEAD WITHOUT CONTRAST TECHNIQUE: Multiplanar, multiecho pulse sequences of the brain and surrounding structures were obtained without intravenous contrast. COMPARISON:  Head CT 08/19/2019. FINDINGS: Brain: Intermittently motion degraded examination. Unchanged prior examination, there is ventriculomegaly which is out of proportion to the degree of parenchymal volume loss and findings are suspicious for communicating/normal pressure hydrocephalus. Mild multifocal T2/FLAIR hyperintensity within the cerebral white matter is nonspecific, but consistent with chronic small vessel ischemic disease. There is no acute infarct. No evidence of intracranial mass. No chronic intracranial blood products. No extra-axial fluid collection. No midline shift. Partially empty sella turcica. Vascular: Expected proximal arterial flow voids. Skull and upper cervical spine: No focal marrow lesion identified within the limitations of motion degradation. Sinuses/Orbits: Visualized orbits show no acute finding. Mild ethmoid sinus mucosal thickening. Bilateral mastoid effusions IMPRESSION: 1. Intermittently motion degraded exam. 2. No evidence of acute infarct. 3. Unchanged ventriculomegaly which appears out of proportion to the degree of generalized parenchymal volume loss, and findings again raise suspicion for communicating/normal pressure hydrocephalus. 4. Mild generalized parenchymal atrophy and chronic small vessel ischemic disease. 5. Mild ethmoid sinus mucosal thickening. 6. Bilateral mastoid effusions. Electronically Signed   By: Kellie Simmering DO   On: 08/20/2019 13:42   ECHOCARDIOGRAM COMPLETE  Result Date: 08/22/2019    ECHOCARDIOGRAM REPORT   Patient Name:   KABAO GLAZER Date of Exam: 08/21/2019 Medical Rec #:  [id number]           Height:       62.0 in Accession #:     [id number]          Weight:       134.9 lb Date of Birth:  1950/11/29          BSA:          1.617 m Patient Age:    69 years            BP:           189/87 mmHg Patient Gender: F                   HR:           98 bpm. Exam Location:  [facility] Procedure: 2D Echo, Cardiac Doppler and Color Doppler Indications:     Pulmonary hypertension 416.8 / I27.2  History:         Patient has prior history of Echocardiogram examinations. COPD;                  Risk Factors:Hypertension.  Sonographer:     Alyse Low Roar Referring Phys:  [id number] CARMEN Veda Canning Diagnosing Phys: Kathlyn Sacramento MD IMPRESSIONS  1. Left ventricular ejection fraction, by estimation, is 50 to 55%. The left ventricle has low normal function. The left ventricle has no regional wall motion abnormalities. Left ventricular  diastolic parameters are consistent with Grade I diastolic dysfunction (impaired relaxation). There is the interventricular septum is flattened in systole, consistent with right ventricular pressure overload.  2. Right ventricular systolic function was not well visualized. The right ventricular size is mildly enlarged. Tricuspid regurgitation signal is inadequate for assessing PA pressure.  3. The mitral valve is normal in structure. No evidence of mitral valve regurgitation. No evidence of mitral stenosis.  4. The aortic valve is normal in structure. Aortic valve regurgitation is not visualized. Mild to moderate aortic valve sclerosis/calcification is present, without any evidence of aortic stenosis.  5. The inferior vena cava is normal in size with greater than 50% respiratory variability, suggesting right atrial pressure of 3 mmHg. FINDINGS  Left Ventricle: Left ventricular ejection fraction, by estimation, is 50 to 55%. The left ventricle has low normal function. The left ventricle has no regional wall motion abnormalities. The left ventricular internal cavity size was normal in size. There is no left ventricular hypertrophy. The  interventricular septum is flattened in systole, consistent with right ventricular pressure overload. Left ventricular diastolic parameters are consistent with Grade I diastolic dysfunction (impaired relaxation). Right Ventricle: The right ventricular size is mildly enlarged. No increase in right ventricular wall thickness. Right ventricular systolic function was not well visualized. Tricuspid regurgitation signal is inadequate for assessing PA pressure. Left Atrium: Left atrial size was normal in size. Right Atrium: Right atrial size was normal in size. Pericardium: There is no evidence of pericardial effusion. Mitral Valve: The mitral valve is normal in structure. Normal mobility of the mitral valve leaflets. No evidence of mitral valve regurgitation. No evidence of mitral valve stenosis. Tricuspid Valve: The tricuspid valve is normal in structure. Tricuspid valve regurgitation is not demonstrated. No evidence of tricuspid stenosis. Aortic Valve: The aortic valve is normal in structure. Aortic valve regurgitation is not visualized. Mild to moderate aortic valve sclerosis/calcification is present, without any evidence of aortic stenosis. Aortic valve mean gradient measures 4.0 mmHg. Aortic valve peak gradient measures 8.9 mmHg. Aortic valve area, by VTI measures 2.24 cm. Pulmonic Valve: The pulmonic valve was normal in structure. Pulmonic valve regurgitation is trivial. No evidence of pulmonic stenosis. Aorta: The aortic root is normal in size and structure. Venous: The inferior vena cava is normal in size with greater than 50% respiratory variability, suggesting right atrial pressure of 3 mmHg. IAS/Shunts: No atrial level shunt detected by color flow Doppler.  LEFT VENTRICLE PLAX 2D LVIDd:         4.51 cm  Diastology LVIDs:         3.32 cm  LV e' lateral:   6.85 cm/s LV PW:         0.96 cm  LV E/e' lateral: 8.6 LV IVS:        1.09 cm  LV e' medial:    4.35 cm/s LVOT diam:     1.90 cm  LV E/e' medial:  13.5 LV SV:          57 LV SV Index:   35 LVOT Area:     2.84 cm  RIGHT VENTRICLE RV Mid diam:    2.60 cm RV S prime:     10.00 cm/s TAPSE (M-mode): 1.5 cm LEFT ATRIUM             Index       RIGHT ATRIUM          Index LA diam:        2.80 cm 1.73 cm/m  RA Area:     9.05 cm LA Vol (A2C):   32.9 ml 20.34 ml/m RA Volume:   16.40 ml 10.14 ml/m LA Vol (A4C):   38.6 ml 23.87 ml/m LA Biplane Vol: 38.7 ml 23.93 ml/m  AORTIC VALVE                   PULMONIC VALVE AV Area (Vmax):    1.65 cm    PV Vmax:        0.99 m/s AV Area (Vmean):   1.73 cm    PV Peak grad:   3.9 mmHg AV Area (VTI):     2.24 cm    RVOT Peak grad: 4 mmHg AV Vmax:           149.00 cm/s AV Vmean:          92.400 cm/s AV VTI:            0.253 m AV Peak Grad:      8.9 mmHg AV Mean Grad:      4.0 mmHg LVOT Vmax:         86.80 cm/s LVOT Vmean:        56.300 cm/s LVOT VTI:          0.200 m LVOT/AV VTI ratio: 0.79  AORTA Ao Root diam: 2.80 cm MITRAL VALVE MV Area (PHT): 5.97 cm     SHUNTS MV Decel Time: 127 msec     Systemic VTI:  0.20 m MV E velocity: 58.70 cm/s   Systemic Diam: 1.90 cm MV A velocity: 115.00 cm/s MV E/A ratio:  0.51 MV A Prime:    12.7 cm/s Kathlyn Sacramento MD Electronically signed by Kathlyn Sacramento MD Signature Date/Time: 08/22/2019/7:53:17 AM    Final      Subjective: Patient was feeling better when seen today.  No new complaints.  She thinks that she is back to her baseline now.  Discharge Exam: Vitals:   08/22/19 0754 08/22/19 1151  BP:  (!) 160/88  Pulse:  80  Resp:  17  Temp:  97.6 F (36.4 C)  SpO2: 98% 100%   Vitals:   08/22/19 0452 08/22/19 0729 08/22/19 0754 08/22/19 1151  BP: (!) 155/81 (!) 178/76  (!) 160/88  Pulse: 86 88  80  Resp: 19 17  17   Temp: 97.7 F (36.5 C) 98.4 F (36.9 C)  97.6 F (36.4 C)  TempSrc:  Oral    SpO2: 97% 99% 98% 100%  Weight:      Height:        General: Pt is alert, awake, not in acute distress Cardiovascular: RRR, S1/S2 +, no rubs, no gallops Respiratory: CTA bilaterally, no  wheezing, no rhonchi Abdominal: Soft, NT, ND, bowel sounds + Extremities: no edema, no cyanosis   The results of significant diagnostics from this hospitalization (including imaging, microbiology, ancillary and laboratory) are listed below for reference.    Microbiology: Recent Results (from the past 240 hour(s))  Respiratory Panel by RT PCR (Flu A&B, Covid) - Nasopharyngeal Swab     Status: None   Collection Time: 08/19/19  4:33 PM   Specimen: Nasopharyngeal Swab  Result Value Ref Range Status   SARS Coronavirus 2 by RT PCR NEGATIVE NEGATIVE Final    Comment: (NOTE) SARS-CoV-2 target nucleic acids are NOT DETECTED. The SARS-CoV-2 RNA is generally detectable in upper respiratoy specimens during the acute phase of infection. The lowest concentration of SARS-CoV-2 viral copies this assay can detect is 131 copies/mL. A negative  result does not preclude SARS-Cov-2 infection and should not be used as the sole basis for treatment or other patient management decisions. A negative result may occur with  improper specimen collection/handling, submission of specimen other than nasopharyngeal swab, presence of viral mutation(s) within the areas targeted by this assay, and inadequate number of viral copies (<131 copies/mL). A negative result must be combined with clinical observations, patient history, and epidemiological information. The expected result is Negative. Fact Sheet for Patients:  PinkCheek.be Fact Sheet for Healthcare Providers:  GravelBags.it This test is not yet ap proved or cleared by the [place] FDA and  has been authorized for detection and/or diagnosis of SARS-CoV-2 by FDA under an Emergency Use Authorization (EUA). This EUA will remain  in effect (meaning this test can be used) for the duration of the COVID-19 declaration under Section 564(b)(1) of the Act, 21 U.S.C. section 360bbb-3(b)(1), unless the  authorization is terminated or revoked sooner.    Influenza A by PCR NEGATIVE NEGATIVE Final   Influenza B by PCR NEGATIVE NEGATIVE Final    Comment: (NOTE) The Xpert Xpress SARS-CoV-2/FLU/RSV assay is intended as an aid in  the diagnosis of influenza from Nasopharyngeal swab specimens and  should not be used as a sole basis for treatment. Nasal washings and  aspirates are unacceptable for Xpert Xpress SARS-CoV-2/FLU/RSV  testing. Fact Sheet for Patients: PinkCheek.be Fact Sheet for Healthcare Providers: GravelBags.it This test is not yet approved or cleared by the [place] FDA and  has been authorized for detection and/or diagnosis of SARS-CoV-2 by  FDA under an Emergency Use Authorization (EUA). This EUA will remain  in effect (meaning this test can be used) for the duration of the  Covid-19 declaration under Section 564(b)(1) of the Act, 21  U.S.C. section 360bbb-3(b)(1), unless the authorization is  terminated or revoked. Performed at [facility], [address] [employer] [place], [place] [address]      Labs: BNP (last 3 results) No results for input(s): BNP in the last 8760 hours. Basic Metabolic Panel: Recent Labs  Lab 08/19/19 1633 08/20/19 0459 08/20/19 1442 08/21/19 0531 08/22/19 0421  NA 133* 134*  --  135 137  K 4.9 4.2  --  3.9 3.4*  CL 86* 93*  --  92* 90*  CO2 30 32  --  34* 39*  GLUCOSE 186* 138*  --  105* 121*  BUN 24* 26*  --  21 15  CREATININE 0.68 0.49  --  0.35* 0.37*  CALCIUM 9.5 8.7*  --  8.8* 8.7*  MG  --   --  1.9  --   --   PHOS  --   --  2.8  --   --    Liver Function Tests: Recent Labs  Lab 08/19/19 1633  AST 24  ALT 13  ALKPHOS 53  BILITOT 1.8*  PROT 8.3*  ALBUMIN 5.0   No results for input(s): LIPASE, AMYLASE in the last 168 hours. No results for input(s): AMMONIA in the last 168 hours. CBC: Recent Labs  Lab 08/19/19 1633 08/20/19 0459  WBC 9.7 5.7   NEUTROABS 8.4*  --   HGB 13.3 11.6*  HCT 44.1 36.4  MCV 90.4 87.3  PLT 304 239   Cardiac Enzymes: No results for input(s): CKTOTAL, CKMB, CKMBINDEX, TROPONINI in the last 168 hours. BNP: Invalid input(s): POCBNP CBG: Recent Labs  Lab 08/21/19 2007 08/21/19 2334 08/22/19 0443 08/22/19 0810 08/22/19 1146  GLUCAP 139* 101* 125* 109* 158*   D-Dimer  No results for input(s): DDIMER in the last 72 hours. Hgb A1c Recent Labs    08/19/19 1909  HGBA1C 6.4*   Lipid Profile No results for input(s): CHOL, HDL, LDLCALC, TRIG, CHOLHDL, LDLDIRECT in the last 72 hours. Thyroid function studies Recent Labs    08/20/19 1442  TSH 0.419   Anemia work up No results for input(s): VITAMINB12, FOLATE, FERRITIN, TIBC, IRON, RETICCTPCT in the last 72 hours. Urinalysis    Component Value Date/Time   COLORURINE YELLOW (A) 02/25/2018 1259   APPEARANCEUR CLEAR (A) 02/25/2018 1259   LABSPEC 1.028 02/25/2018 1259   PHURINE 5.0 02/25/2018 1259   GLUCOSEU >=500 (A) 02/25/2018 1259   HGBUR NEGATIVE 02/25/2018 1259   BILIRUBINUR NEGATIVE 02/25/2018 1259   KETONESUR 5 (A) 02/25/2018 1259   PROTEINUR NEGATIVE 02/25/2018 1259   NITRITE NEGATIVE 02/25/2018 1259   LEUKOCYTESUR NEGATIVE 02/25/2018 1259   Sepsis Labs Invalid input(s): PROCALCITONIN,  WBC,  LACTICIDVEN Microbiology Recent Results (from the past 240 hour(s))  Respiratory Panel by RT PCR (Flu A&B, Covid) - Nasopharyngeal Swab     Status: None   Collection Time: 08/19/19  4:33 PM   Specimen: Nasopharyngeal Swab  Result Value Ref Range Status   SARS Coronavirus 2 by RT PCR NEGATIVE NEGATIVE Final    Comment: (NOTE) SARS-CoV-2 target nucleic acids are NOT DETECTED. The SARS-CoV-2 RNA is generally detectable in upper respiratoy specimens during the acute phase of infection. The lowest concentration of SARS-CoV-2 viral copies this assay can detect is 131 copies/mL. A negative result does not preclude SARS-Cov-2 infection and should  not be used as the sole basis for treatment or other patient management decisions. A negative result may occur with  improper specimen collection/handling, submission of specimen other than nasopharyngeal swab, presence of viral mutation(s) within the areas targeted by this assay, and inadequate number of viral copies (<131 copies/mL). A negative result must be combined with clinical observations, patient history, and epidemiological information. The expected result is Negative. Fact Sheet for Patients:  PinkCheek.be Fact Sheet for Healthcare Providers:  GravelBags.it This test is not yet ap proved or cleared by the [place] FDA and  has been authorized for detection and/or diagnosis of SARS-CoV-2 by FDA under an Emergency Use Authorization (EUA). This EUA will remain  in effect (meaning this test can be used) for the duration of the COVID-19 declaration under Section 564(b)(1) of the Act, 21 U.S.C. section 360bbb-3(b)(1), unless the authorization is terminated or revoked sooner.    Influenza A by PCR NEGATIVE NEGATIVE Final   Influenza B by PCR NEGATIVE NEGATIVE Final    Comment: (NOTE) The Xpert Xpress SARS-CoV-2/FLU/RSV assay is intended as an aid in  the diagnosis of influenza from Nasopharyngeal swab specimens and  should not be used as a sole basis for treatment. Nasal washings and  aspirates are unacceptable for Xpert Xpress SARS-CoV-2/FLU/RSV  testing. Fact Sheet for Patients: PinkCheek.be Fact Sheet for Healthcare Providers: GravelBags.it This test is not yet approved or cleared by the [place] FDA and  has been authorized for detection and/or diagnosis of SARS-CoV-2 by  FDA under an Emergency Use Authorization (EUA). This EUA will remain  in effect (meaning this test can be used) for the duration of the  Covid-19 declaration under Section 564(b)(1)  of the Act, 21  U.S.C. section 360bbb-3(b)(1), unless the authorization is  terminated or revoked. Performed at [facility], [address], [place], [place] [address]     Time coordinating discharge: Over 30 minutes  SIGNED:  Lorella Nimrod, MD  [facility] 08/22/2019, 12:47 PM  If 7PM-7AM, please contact night-coverage [email or web address]  This record has been created using Systems analyst. Errors have been sought and corrected,but may not always be located. Such creation errors do not reflect on the standard of care.

## 2019-08-24 IMAGING — DX DG CHEST 1V PORT
1 series · 1 of 1 positions shown · non-contrast
Comparison: 02/10/2018

CLINICAL DATA: Shortness of breath

EXAM:
PORTABLE CHEST 1 VIEW

[chest ap]
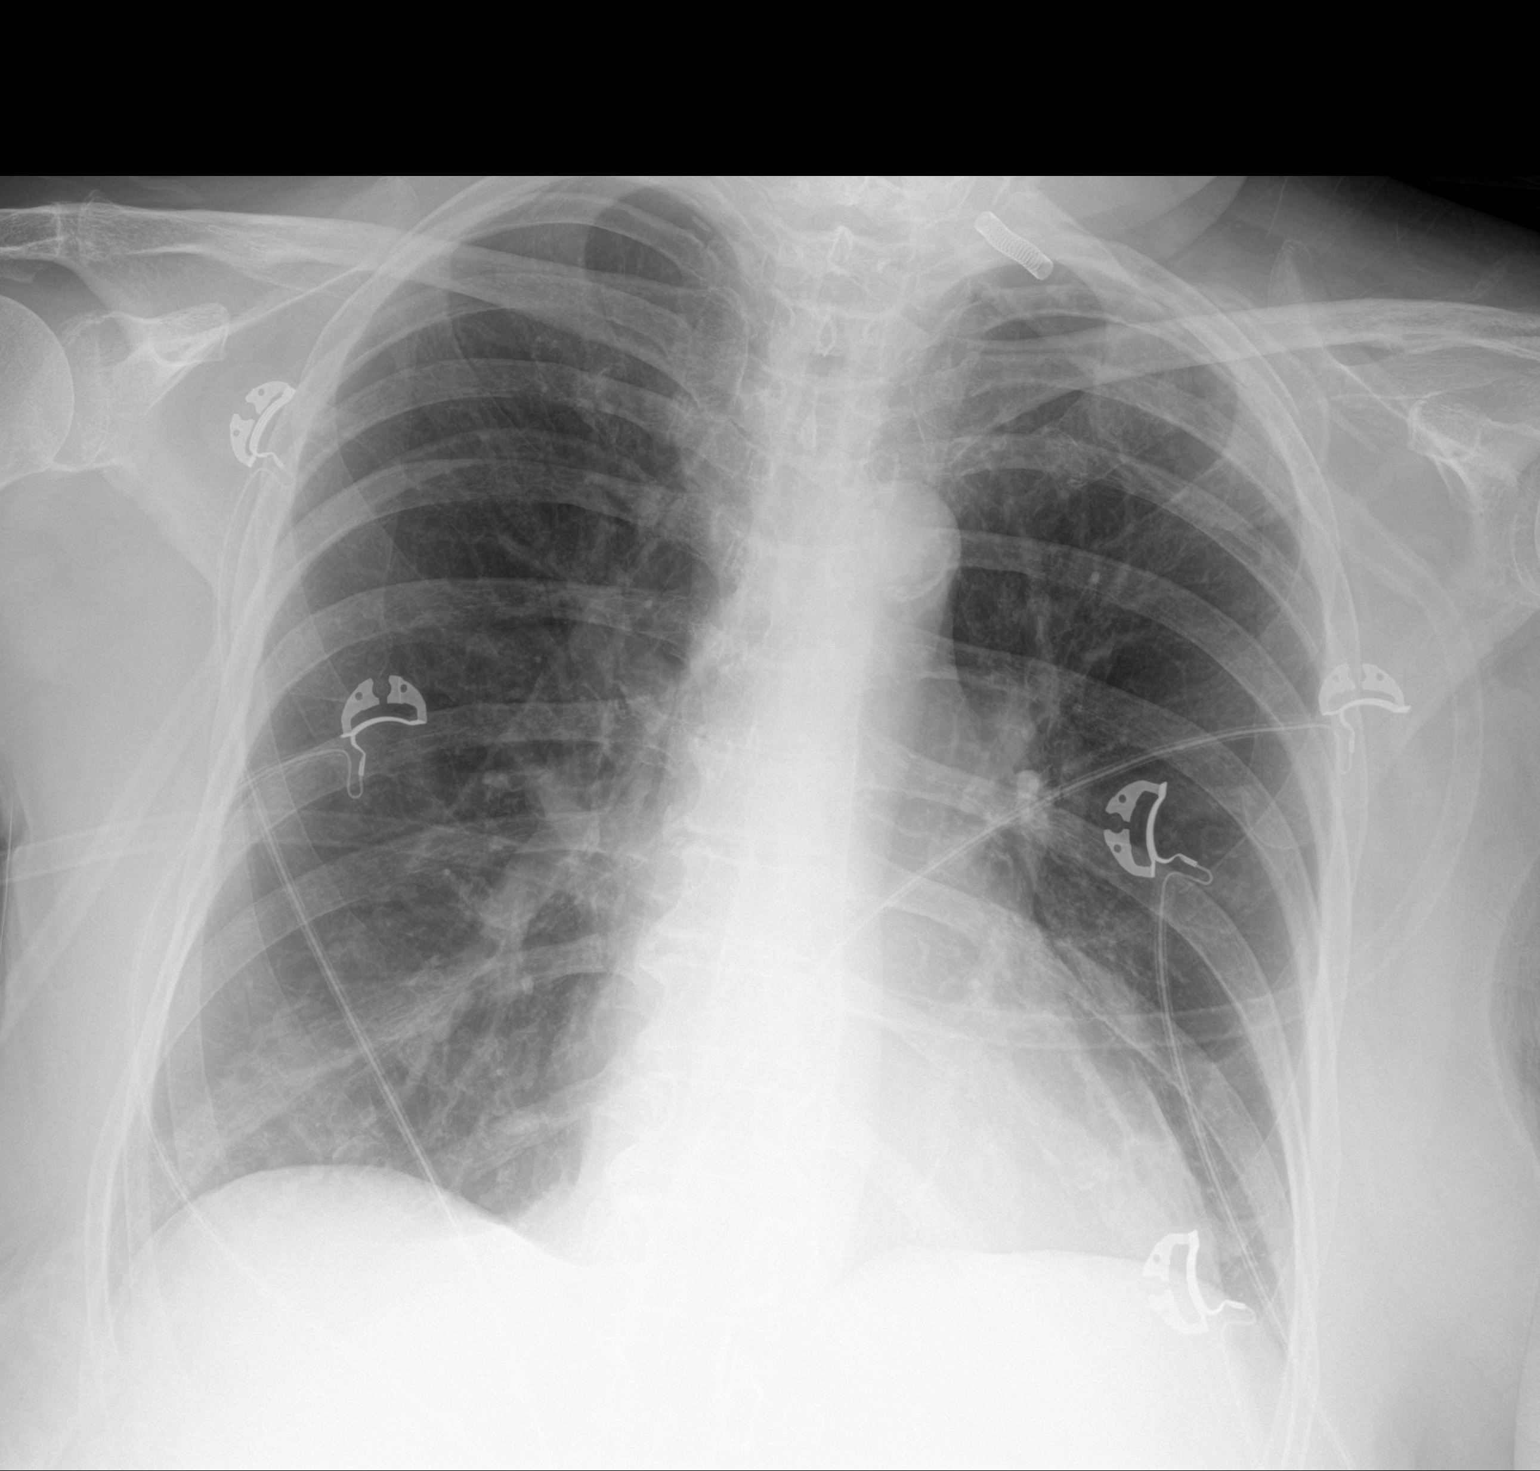

[1 of 1 positions shown; findings below may reference images not displayed]

FINDINGS: Mild hyperinflation. Heart is borderline in size. No confluent
airspace opacities, effusions or edema. No acute bony abnormality.
IMPRESSION: Mild hyperinflation.  No active disease.

## 2019-08-30 ENCOUNTER — Emergency Department: Payer: Medicare Other

## 2019-08-30 ENCOUNTER — Encounter: Payer: Self-pay | Admitting: Emergency Medicine

## 2019-08-30 ENCOUNTER — Inpatient Hospital Stay
Admission: EM | Admit: 2019-08-30 | Discharge: 2019-09-13 | DRG: 208 | Disposition: E | Payer: Medicare Other | Attending: Pulmonary Disease | Admitting: Pulmonary Disease

## 2019-08-30 ENCOUNTER — Other Ambulatory Visit: Payer: Self-pay

## 2019-08-30 ENCOUNTER — Inpatient Hospital Stay: Payer: Medicare Other

## 2019-08-30 DIAGNOSIS — Z91048 Other nonmedicinal substance allergy status: Secondary | ICD-10-CM | POA: Diagnosis not present

## 2019-08-30 DIAGNOSIS — J9622 Acute and chronic respiratory failure with hypercapnia: Secondary | ICD-10-CM | POA: Diagnosis present

## 2019-08-30 DIAGNOSIS — R400 Somnolence: Secondary | ICD-10-CM | POA: Diagnosis not present

## 2019-08-30 DIAGNOSIS — Z885 Allergy status to narcotic agent status: Secondary | ICD-10-CM

## 2019-08-30 DIAGNOSIS — Z7951 Long term (current) use of inhaled steroids: Secondary | ICD-10-CM

## 2019-08-30 DIAGNOSIS — Z888 Allergy status to other drugs, medicaments and biological substances status: Secondary | ICD-10-CM | POA: Diagnosis not present

## 2019-08-30 DIAGNOSIS — J441 Chronic obstructive pulmonary disease with (acute) exacerbation: Secondary | ICD-10-CM | POA: Diagnosis present

## 2019-08-30 DIAGNOSIS — R4020 Unspecified coma: Secondary | ICD-10-CM | POA: Diagnosis not present

## 2019-08-30 DIAGNOSIS — I639 Cerebral infarction, unspecified: Secondary | ICD-10-CM | POA: Diagnosis present

## 2019-08-30 DIAGNOSIS — Z7984 Long term (current) use of oral hypoglycemic drugs: Secondary | ICD-10-CM

## 2019-08-30 DIAGNOSIS — I110 Hypertensive heart disease with heart failure: Secondary | ICD-10-CM | POA: Diagnosis present

## 2019-08-30 DIAGNOSIS — Z20822 Contact with and (suspected) exposure to covid-19: Secondary | ICD-10-CM | POA: Diagnosis present

## 2019-08-30 DIAGNOSIS — J9601 Acute respiratory failure with hypoxia: Secondary | ICD-10-CM | POA: Diagnosis present

## 2019-08-30 DIAGNOSIS — I959 Hypotension, unspecified: Secondary | ICD-10-CM | POA: Diagnosis present

## 2019-08-30 DIAGNOSIS — E119 Type 2 diabetes mellitus without complications: Secondary | ICD-10-CM | POA: Diagnosis present

## 2019-08-30 DIAGNOSIS — I5033 Acute on chronic diastolic (congestive) heart failure: Secondary | ICD-10-CM | POA: Diagnosis present

## 2019-08-30 DIAGNOSIS — Z66 Do not resuscitate: Secondary | ICD-10-CM | POA: Diagnosis present

## 2019-08-30 DIAGNOSIS — Z7983 Long term (current) use of bisphosphonates: Secondary | ICD-10-CM | POA: Diagnosis not present

## 2019-08-30 DIAGNOSIS — Z9981 Dependence on supplemental oxygen: Secondary | ICD-10-CM | POA: Diagnosis not present

## 2019-08-30 DIAGNOSIS — Z882 Allergy status to sulfonamides status: Secondary | ICD-10-CM | POA: Diagnosis not present

## 2019-08-30 DIAGNOSIS — Z87891 Personal history of nicotine dependence: Secondary | ICD-10-CM | POA: Diagnosis not present

## 2019-08-30 DIAGNOSIS — Z8673 Personal history of transient ischemic attack (TIA), and cerebral infarction without residual deficits: Secondary | ICD-10-CM | POA: Diagnosis not present

## 2019-08-30 DIAGNOSIS — Z79899 Other long term (current) drug therapy: Secondary | ICD-10-CM | POA: Diagnosis not present

## 2019-08-30 DIAGNOSIS — J9602 Acute respiratory failure with hypercapnia: Secondary | ICD-10-CM | POA: Diagnosis not present

## 2019-08-30 DIAGNOSIS — Z515 Encounter for palliative care: Secondary | ICD-10-CM | POA: Diagnosis not present

## 2019-08-30 DIAGNOSIS — J9621 Acute and chronic respiratory failure with hypoxia: Principal | ICD-10-CM | POA: Diagnosis present

## 2019-08-30 LAB — COMPREHENSIVE METABOLIC PANEL
ALT: 41 U/L (ref 0–44)
AST: 35 U/L (ref 15–41)
Albumin: 4.2 g/dL (ref 3.5–5.0)
Alkaline Phosphatase: 44 U/L (ref 38–126)
Anion gap: 12 (ref 5–15)
BUN: 29 mg/dL — ABNORMAL HIGH (ref 8–23)
CO2: 38 mmol/L — ABNORMAL HIGH (ref 22–32)
Calcium: 9.2 mg/dL (ref 8.9–10.3)
Chloride: 85 mmol/L — ABNORMAL LOW (ref 98–111)
Creatinine, Ser: 0.6 mg/dL (ref 0.44–1.00)
GFR calc Af Amer: >60 mL/min (ref >60)
GFR calc non Af Amer: >60 mL/min (ref >60)
Glucose, Bld: 182 mg/dL — ABNORMAL HIGH (ref 70–99)
Potassium: 5.1 mmol/L (ref 3.5–5.1)
Sodium: 135 mmol/L (ref 135–145)
Total Bilirubin: 1.1 mg/dL (ref 0.3–1.2)
Total Protein: 7.3 g/dL (ref 6.5–8.1)

## 2019-08-30 LAB — CBC WITH DIFFERENTIAL/PLATELET
Abs Immature Granulocytes: 0.05 10*3/uL (ref 0.00–0.07)
Basophils Absolute: 0 10*3/uL (ref 0.0–0.1)
Basophils Relative: 0 %
Eosinophils Absolute: 0 10*3/uL (ref 0.0–0.5)
Eosinophils Relative: 0 %
HCT: 38.7 % (ref 36.0–46.0)
Hemoglobin: 11.6 g/dL — ABNORMAL LOW (ref 12.0–15.0)
Immature Granulocytes: 1 %
Lymphocytes Relative: 5 %
Lymphs Abs: 0.4 10*3/uL — ABNORMAL LOW (ref 0.7–4.0)
MCH: 27.8 pg (ref 26.0–34.0)
MCHC: 30 g/dL (ref 30.0–36.0)
MCV: 92.6 fL (ref 80.0–100.0)
Monocytes Absolute: 0.4 10*3/uL (ref 0.1–1.0)
Monocytes Relative: 6 %
Neutro Abs: 6.1 10*3/uL (ref 1.7–7.7)
Neutrophils Relative %: 88 %
Platelets: 364 10*3/uL (ref 150–400)
RBC: 4.18 MIL/uL (ref 3.87–5.11)
RDW: 13.5 % (ref 11.5–15.5)
WBC: 6.9 10*3/uL (ref 4.0–10.5)
nRBC: 0 % (ref 0.0–0.2)

## 2019-08-30 LAB — LACTIC ACID, PLASMA
Lactic Acid, Venous: 2.1 mmol/L (ref 0.5–1.9)
Lactic Acid, Venous: 3 mmol/L (ref 0.5–1.9)

## 2019-08-30 LAB — BLOOD GAS, ARTERIAL
Acid-Base Excess: 11.8 mmol/L — ABNORMAL HIGH (ref 0.0–2.0)
Allens test (pass/fail): POSITIVE — AB
Bicarbonate: 42.3 mmol/L — ABNORMAL HIGH (ref 20.0–28.0)
Delivery systems: POSITIVE
Expiratory PAP: 8
FIO2: 0.4
Inspiratory PAP: 16
O2 Saturation: 92.3 %
Patient temperature: 37
RATE: 16 resp/min
pCO2 arterial: 88 mmHg (ref 32.0–48.0)
pH, Arterial: 7.29 — ABNORMAL LOW (ref 7.350–7.450)
pO2, Arterial: 72 mmHg — ABNORMAL LOW (ref 83.0–108.0)

## 2019-08-30 LAB — MRSA PCR SCREENING: MRSA by PCR: NEGATIVE

## 2019-08-30 LAB — GLUCOSE, CAPILLARY
Glucose-Capillary: 165 mg/dL — ABNORMAL HIGH (ref 70–99)
Glucose-Capillary: 134 mg/dL — ABNORMAL HIGH (ref 70–99)
Glucose-Capillary: 136 mg/dL — ABNORMAL HIGH (ref 70–99)

## 2019-08-30 LAB — TROPONIN I (HIGH SENSITIVITY)
Troponin I (High Sensitivity): 55 ng/L — ABNORMAL HIGH (ref <18)
Troponin I (High Sensitivity): 91 ng/L — ABNORMAL HIGH (ref <18)

## 2019-08-30 LAB — TRIGLYCERIDES: Triglycerides: 119 mg/dL (ref <150)

## 2019-08-30 LAB — BRAIN NATRIURETIC PEPTIDE: B Natriuretic Peptide: 1290.8 pg/mL — ABNORMAL HIGH (ref 0.0–100.0)

## 2019-08-30 LAB — SARS CORONAVIRUS 2 BY RT PCR (HOSPITAL ORDER, PERFORMED IN ~~LOC~~ HOSPITAL LAB): SARS Coronavirus 2: NEGATIVE

## 2019-08-30 MED ORDER — LACTATED RINGERS IV BOLUS
250.0000 mL | Freq: Once | INTRAVENOUS | Status: AC
  Administered 2019-08-30: 250 mL via INTRAVENOUS

## 2019-08-30 MED ORDER — MIDAZOLAM HCL 2 MG/2ML IJ SOLN
1.0000 mg | INTRAMUSCULAR | Status: DC | PRN
  Administered 2019-08-31 – 2019-09-01 (×2): 1 mg via INTRAVENOUS
  Filled 2019-08-30 (×4): qty 2

## 2019-08-30 MED ORDER — POLYETHYLENE GLYCOL 3350 17 G PO PACK: 17.0000 g | PACK | Freq: Every day | ORAL | Status: DC | PRN

## 2019-08-30 MED ORDER — FENTANYL CITRATE (PF) 100 MCG/2ML IJ SOLN: 50.0000 ug | INTRAMUSCULAR | Status: DC | PRN

## 2019-08-30 MED ORDER — CHLORHEXIDINE GLUCONATE CLOTH 2 % EX PADS
6.0000 | MEDICATED_PAD | Freq: Every day | CUTANEOUS | Status: DC
  Administered 2019-08-30 – 2019-09-01 (×3): 6 via TOPICAL

## 2019-08-30 MED ORDER — PROPOFOL 1000 MG/100ML IV EMUL
INTRAVENOUS | Status: AC
  Filled 2019-08-30: qty 100

## 2019-08-30 MED ORDER — MIDAZOLAM HCL 2 MG/2ML IJ SOLN
1.0000 mg | INTRAMUSCULAR | Status: DC | PRN
  Administered 2019-08-31: 1 mg via INTRAVENOUS

## 2019-08-30 MED ORDER — IPRATROPIUM-ALBUTEROL 0.5-2.5 (3) MG/3ML IN SOLN
3.0000 mL | Freq: Four times a day (QID) | RESPIRATORY_TRACT | Status: DC
  Administered 2019-08-31 – 2019-09-01 (×7): 3 mL via RESPIRATORY_TRACT
  Filled 2019-08-30 (×7): qty 3

## 2019-08-30 MED ORDER — SODIUM CHLORIDE 0.9 % IV BOLUS
1000.0000 mL | Freq: Once | INTRAVENOUS | Status: AC
  Administered 2019-08-30: 1000 mL via INTRAVENOUS

## 2019-08-30 MED ORDER — FUROSEMIDE 10 MG/ML IJ SOLN
40.0000 mg | Freq: Once | INTRAMUSCULAR | Status: AC
  Administered 2019-08-30: 40 mg via INTRAVENOUS
  Filled 2019-08-30: qty 4

## 2019-08-30 MED ORDER — FENTANYL CITRATE (PF) 100 MCG/2ML IJ SOLN: 25.0000 ug | Freq: Once | INTRAMUSCULAR | Status: AC

## 2019-08-30 MED ORDER — ASPIRIN 300 MG RE SUPP: 300.0000 mg | RECTAL | Status: AC

## 2019-08-30 MED ORDER — MIDAZOLAM HCL 2 MG/2ML IJ SOLN
INTRAMUSCULAR | Status: AC
  Administered 2019-08-30: 1 mg via INTRAVENOUS
  Filled 2019-08-30: qty 2

## 2019-08-30 MED ORDER — FENTANYL CITRATE (PF) 100 MCG/2ML IJ SOLN
50.0000 ug | INTRAMUSCULAR | Status: AC | PRN
  Administered 2019-08-30 (×3): 50 ug via INTRAVENOUS
  Filled 2019-08-30 (×2): qty 2

## 2019-08-30 MED ORDER — CHLORHEXIDINE GLUCONATE 0.12% ORAL RINSE (MEDLINE KIT)
15.0000 mL | Freq: Two times a day (BID) | OROMUCOSAL | Status: DC
  Administered 2019-08-30 – 2019-09-01 (×4): 15 mL via OROMUCOSAL

## 2019-08-30 MED ORDER — PROPOFOL 1000 MG/100ML IV EMUL
0.0000 ug/kg/min | INTRAVENOUS | Status: DC
  Administered 2019-08-30: 5 ug/kg/min via INTRAVENOUS

## 2019-08-30 MED ORDER — ENOXAPARIN SODIUM 40 MG/0.4ML ~~LOC~~ SOLN
40.0000 mg | SUBCUTANEOUS | Status: DC
  Administered 2019-08-30 – 2019-08-31 (×2): 40 mg via SUBCUTANEOUS
  Filled 2019-08-30 (×2): qty 0.4

## 2019-08-30 MED ORDER — FENTANYL 2500MCG IN NS 250ML (10MCG/ML) PREMIX INFUSION
INTRAVENOUS | Status: AC
  Administered 2019-08-30: 200 ug/h via INTRAVENOUS
  Filled 2019-08-30: qty 250

## 2019-08-30 MED ORDER — SUCCINYLCHOLINE CHLORIDE 20 MG/ML IJ SOLN
1.5000 mg/kg | Freq: Once | INTRAMUSCULAR | Status: AC
  Administered 2019-08-30: 91.6 mg via INTRAVENOUS

## 2019-08-30 MED ORDER — IBUPROFEN 100 MG/5ML PO SUSP
600.0000 mg | Freq: Once | ORAL | Status: AC
  Administered 2019-08-30: 600 mg
  Filled 2019-08-30 (×2): qty 30

## 2019-08-30 MED ORDER — ETOMIDATE 2 MG/ML IV SOLN
15.0000 mg | Freq: Once | INTRAVENOUS | Status: AC
  Administered 2019-08-30: 15 mg via INTRAVENOUS

## 2019-08-30 MED ORDER — IOHEXOL 350 MG/ML SOLN
75.0000 mL | Freq: Once | INTRAVENOUS | Status: AC | PRN
  Administered 2019-08-30: 75 mL via INTRAVENOUS

## 2019-08-30 MED ORDER — FENTANYL 2500MCG IN NS 250ML (10MCG/ML) PREMIX INFUSION
25.0000 ug/h | INTRAVENOUS | Status: DC
  Administered 2019-08-30: 100 ug/h via INTRAVENOUS
  Administered 2019-08-31 (×2): 200 ug/h via INTRAVENOUS
  Administered 2019-09-01: 150 ug/h via INTRAVENOUS
  Filled 2019-08-30 (×3): qty 250

## 2019-08-30 MED ORDER — BUDESONIDE 0.5 MG/2ML IN SUSP
0.5000 mg | Freq: Two times a day (BID) | RESPIRATORY_TRACT | Status: DC
  Administered 2019-08-31 – 2019-09-01 (×3): 0.5 mg via RESPIRATORY_TRACT
  Filled 2019-08-30 (×3): qty 2

## 2019-08-30 MED ORDER — SODIUM CHLORIDE 0.9 % IV SOLN
2.0000 g | Freq: Once | INTRAVENOUS | Status: AC
  Administered 2019-08-30: 2 g via INTRAVENOUS
  Filled 2019-08-30: qty 2

## 2019-08-30 MED ORDER — ORAL CARE MOUTH RINSE
15.0000 mL | OROMUCOSAL | Status: DC
  Administered 2019-08-30 – 2019-09-01 (×21): 15 mL via OROMUCOSAL

## 2019-08-30 MED ORDER — SODIUM CHLORIDE 0.9 % IV SOLN
500.0000 mg | Freq: Once | INTRAVENOUS | Status: AC
  Administered 2019-08-30: 500 mg via INTRAVENOUS
  Filled 2019-08-30: qty 500

## 2019-08-30 MED ORDER — ALBUTEROL SULFATE (2.5 MG/3ML) 0.083% IN NEBU: 2.5000 mg | INHALATION_SOLUTION | Freq: Once | RESPIRATORY_TRACT | Status: AC

## 2019-08-30 MED ORDER — SENNOSIDES-DOCUSATE SODIUM 8.6-50 MG PO TABS
2.0000 | ORAL_TABLET | Freq: Two times a day (BID) | ORAL | Status: DC
  Administered 2019-08-30 – 2019-09-01 (×4): 2
  Filled 2019-08-30 (×4): qty 2

## 2019-08-30 MED ORDER — DOCUSATE SODIUM 100 MG PO CAPS: 100.0000 mg | ORAL_CAPSULE | Freq: Two times a day (BID) | ORAL | Status: DC | PRN

## 2019-08-30 MED ORDER — IPRATROPIUM-ALBUTEROL 0.5-2.5 (3) MG/3ML IN SOLN: 3.0000 mL | Freq: Once | RESPIRATORY_TRACT | Status: AC

## 2019-08-30 MED ORDER — ALBUTEROL SULFATE (2.5 MG/3ML) 0.083% IN NEBU
INHALATION_SOLUTION | RESPIRATORY_TRACT | Status: AC
  Administered 2019-08-30: 2.5 mg via RESPIRATORY_TRACT
  Filled 2019-08-30: qty 3

## 2019-08-30 MED ORDER — INSULIN ASPART 100 UNIT/ML ~~LOC~~ SOLN
0.0000 [IU] | SUBCUTANEOUS | Status: DC
  Administered 2019-08-30 (×2): 2 [IU] via SUBCUTANEOUS
  Administered 2019-08-30: 3 [IU] via SUBCUTANEOUS
  Administered 2019-08-31 – 2019-09-01 (×2): 2 [IU] via SUBCUTANEOUS
  Filled 2019-08-30 (×5): qty 1

## 2019-08-30 MED ORDER — ASPIRIN 81 MG PO CHEW: 324.0000 mg | CHEWABLE_TABLET | ORAL | Status: AC

## 2019-08-30 MED ORDER — FENTANYL BOLUS VIA INFUSION
25.0000 ug | INTRAVENOUS | Status: DC | PRN
  Administered 2019-08-30: 25 ug via INTRAVENOUS
  Filled 2019-08-30: qty 25

## 2019-08-30 MED ORDER — FENTANYL CITRATE (PF) 100 MCG/2ML IJ SOLN
INTRAMUSCULAR | Status: AC
  Administered 2019-08-30: 50 ug via INTRAVENOUS
  Filled 2019-08-30: qty 2

## 2019-08-30 MED ORDER — MIDAZOLAM HCL 2 MG/2ML IJ SOLN: 2.0000 mg | Freq: Once | INTRAMUSCULAR | Status: DC

## 2019-08-30 MED ORDER — MAGNESIUM SULFATE 2 GM/50ML IV SOLN
2.0000 g | Freq: Once | INTRAVENOUS | Status: AC
  Administered 2019-08-30: 2 g via INTRAVENOUS
  Filled 2019-08-30: qty 50

## 2019-08-30 MED ORDER — IPRATROPIUM-ALBUTEROL 0.5-2.5 (3) MG/3ML IN SOLN
RESPIRATORY_TRACT | Status: AC
  Administered 2019-08-30: 3 mL via RESPIRATORY_TRACT
  Filled 2019-08-30: qty 3

## 2019-08-30 MED ORDER — MIDAZOLAM HCL 2 MG/2ML IJ SOLN: 1.0000 mg | Freq: Once | INTRAMUSCULAR | Status: AC

## 2019-08-30 MED ORDER — FENTANYL CITRATE (PF) 100 MCG/2ML IJ SOLN: 50.0000 ug | Freq: Once | INTRAMUSCULAR | Status: AC

## 2019-08-30 MED ORDER — LACTATED RINGERS IV BOLUS
1000.0000 mL | Freq: Once | INTRAVENOUS | Status: AC
  Administered 2019-08-30: 1000 mL via INTRAVENOUS

## 2019-08-30 MED ORDER — METHYLPREDNISOLONE SODIUM SUCC 40 MG IJ SOLR
40.0000 mg | INTRAMUSCULAR | Status: DC
  Administered 2019-08-30 – 2019-08-31 (×2): 40 mg via INTRAVENOUS
  Filled 2019-08-30 (×2): qty 1

## 2019-08-30 NOTE — ED Notes (Signed)
Pts O2 sats not going above 88% on bipap, good waveform noted on pulseox, RT notified.

## 2019-08-30 NOTE — ED Notes (Signed)
Patient mentation decreasing. Patient opening eyes to voice but unable to follow commands and not tracking across room. MD and RT made aware. Decision made to intubate.

## 2019-08-30 NOTE — Progress Notes (Signed)
Patient remains stable on the ventilator. Vital signs are currently WDL. Patient responds to voice but does not follow commands. She is currently sedated with 100 mcg of fentanyl for comfort and also has a KVO line running. Patient's temperature via temp foley has remained elevated since assuming care of the patient at 1900. Despite administration of 600 mg of ibuprofen suspension, the temperature remains unchanged (102 F). Therefore, the patient was placed on a cooling blanket. the patient remains calm and is in no apparent distress. Will continue to monitor.  Cameron Ali, RN

## 2019-08-30 NOTE — ED Triage Notes (Signed)
Patient from home via [facility]. Reports worsening SOB for the last several days. Denies fever at home. Per EMS, upon FD arrival, pt was 50% on RA upon their arrival. Patient placed on cpap by EMS. Arrives at 90% on cpap. Diminished breath sounds. Respiratory and MD at bedside. Patient placed on bipap immediately upon arrival.

## 2019-08-30 NOTE — H&P (Signed)
CRITICAL CARE PROGRESS NOTE    Name: Belinda Jenkins MRN: [id number] DOB: 18-Aug-1950     LOS: 0   SUBJECTIVE FINDINGS & SIGNIFICANT EVENTS   Patient description:  69 yo F with advanced COPD recurrent admissions, last hospitalized May 2021 1 week ago also for AECOPD. She was intubated in ED due to failure of BIPAP after coming in hypoxemic in respiratory distress.  Upon arrival patient was hypotensive.   Lines / Drains: PIVx2  Cultures / Sepsis markers: Resp cx , blood cx  Antibiotics: Azithrmomycin and Rocephin    Protocols / Consultants: PCCM  Tests / Events: TTE    PAST MEDICAL HISTORY   Past Medical History:  Diagnosis Date  . COPD (chronic obstructive pulmonary disease) ([place])   . Diabetes mellitus without complication ([place])   . Family history of adverse reaction to anesthesia    sister - slow to wake after 1 procedure  . Hypertension   . Vertigo      SURGICAL HISTORY   Past Surgical History:  Procedure Laterality Date  . ABDOMINAL HYSTERECTOMY    . BACK SURGERY     L5  . BLEPHAROPLASTY Bilateral   . BREAST SURGERY     lumpectomy(x1), milk duct(x1)  . CATARACT EXTRACTION W/ INTRAOCULAR LENS  IMPLANT, BILATERAL    . COLONOSCOPY WITH PROPOFOL N/A 07/14/2016   Procedure: COLONOSCOPY WITH PROPOFOL;  Surgeon: Lucilla Lame, MD;  Location: [facility];  Service: Endoscopy;  Laterality: N/A;  Diabetic - oral meds requests arrival around 8 AM  . HIP SURGERY Left   . POLYPECTOMY  07/14/2016   Procedure: POLYPECTOMY;  Surgeon: Lucilla Lame, MD;  Location: [facility];  Service: Endoscopy;;     FAMILY HISTORY   No family history on file.   SOCIAL HISTORY   Social History   Tobacco Use  . Smoking status: Former Smoker    Quit date: 04/15/2003    Years since  quitting: 16.3  . Smokeless tobacco: Never Used  Substance Use Topics  . Alcohol use: Yes    Alcohol/week: 1.0 standard drinks    Types: 1 Shots of liquor per week  . Drug use: Never     MEDICATIONS   Current Medication:  Current Facility-Administered Medications:  .  aspirin chewable tablet 324 mg, 324 mg, Oral, NOW **OR** aspirin suppository 300 mg, 300 mg, Rectal, NOW, Lanney Gins, Welton Bord, MD .  azithromycin (ZITHROMAX) 500 mg in sodium chloride 0.9 % 250 mL IVPB, 500 mg, Intravenous, Once, Duffy Bruce, MD, Last Rate: 250 mL/hr at 08/18/2019 1315, 500 mg at 08/14/2019 1315 .  docusate sodium (COLACE) capsule 100 mg, 100 mg, Oral, BID PRN, Lanney Gins, Rhyse Loux, MD .  enoxaparin (LOVENOX) injection 40 mg, 40 mg, Subcutaneous, Q24H, Neola Worrall, MD .  fentaNYL (SUBLIMAZE) injection 50 mcg, 50 mcg, Intravenous, Q15 min PRN, Duffy Bruce, MD, 50 mcg at 08/25/2019 1234 .  fentaNYL (SUBLIMAZE) injection 50 mcg, 50 mcg, Intravenous, Q2H PRN, Duffy Bruce, MD .  polyethylene glycol (MIRALAX / GLYCOLAX) packet 17 g, 17 g, Oral, Daily PRN, Lanney Gins, Annielee Jemmott, MD .  propofol (DIPRIVAN) 1000 MG/100ML infusion, 0-50 mcg/kg/min, Intravenous, Continuous, Duffy Bruce, MD, Last Rate: 12.81 mL/hr at 09/12/2019 1255, 35 mcg/kg/min at 08/17/2019 1255  Current Outpatient Medications:  .  alendronate (FOSAMAX) 70 MG tablet, Take 70 mg by mouth once a week. , Disp: , Rfl:  .  ALPRAZolam (XANAX) 0.5 MG tablet, Take 0.5 mg by mouth 2 (two) times daily., Disp: , Rfl:  .  budesonide (PULMICORT) 0.5 MG/2ML nebulizer solution, Take 2 mLs (0.5 mg total) by nebulization 2 (two) times daily. DX:J44.9, Disp: 60 mL, Rfl: 12 .  feeding supplement, ENSURE ENLIVE, (ENSURE ENLIVE) LIQD, Take 237 mLs by mouth 2 (two) times daily between meals., Disp: 237 mL, Rfl: 12 .  ipratropium-albuterol (DUONEB) 0.5-2.5 (3) MG/3ML SOLN, Take 3 mLs by nebulization every 6 (six) hours as needed (shortness of breath or wheezing). , Disp: ,  Rfl:  .  metFORMIN (GLUCOPHAGE) 1000 MG tablet, Take 1,000 mg by mouth 2 (two) times daily with a meal., Disp: , Rfl:  .  metoprolol tartrate (LOPRESSOR) 25 MG tablet, Take 1 tablet (25 mg total) by mouth 2 (two) times daily., Disp: 60 tablet, Rfl: 1 .  valsartan (DIOVAN) 160 MG tablet, Take 160 mg by mouth daily., Disp: , Rfl:     ALLERGIES   Sulfamethoxazole-trimethoprim, Morphine, Oxycodone-acetaminophen, Sertraline, and Adhesive [tape]    REVIEW OF SYSTEMS     10 point ROS negative except as per HPI  PHYSICAL EXAMINATION   Vital Signs: Temp:  [97.6 F (36.4 C)] 97.6 F (36.4 C) (05/18 0836) Pulse Rate:  [92-116] 99 (05/18 1305) Resp:  [16-31] 20 (05/18 1305) BP: (82-157)/(64-137) 100/74 (05/18 1305) SpO2:  [57 %-100 %] 100 % (05/18 1305) FiO2 (%):  [35 %] 35 % (05/18 1120) Weight:  [61 kg] 61 kg (05/18 0842)  GENERAL:Chronically ill appearing  HEAD: Normocephalic, atraumatic.  EYES: Pupils equal, round, reactive to light.  No scleral icterus.  MOUTH: Moist mucosal membrane. NECK: Supple. No thyromegaly. No nodules. No JVD.  PULMONARY: Mechanical ventilator respirations with rhonchi bilaterally CARDIOVASCULAR: S1 and S2. Regular rate and rhythm. No murmurs, rubs, or gallops.  GASTROINTESTINAL: Soft, nontender, non-distended. No masses. Positive bowel sounds. No hepatosplenomegaly.  MUSCULOSKELETAL: No swelling, clubbing, or edema.  NEUROLOGIC: GCS4T SKIN:intact    PERTINENT DATA     Infusions: . azithromycin (ZITHROMAX) 500 MG IVPB (Vial-Mate [employer]) 500 mg (08/26/2019 1315)  . propofol (DIPRIVAN) infusion 35 mcg/kg/min (08/26/2019 1255)   Scheduled Medications: . aspirin  324 mg Oral NOW   Or  . aspirin  300 mg Rectal NOW  . enoxaparin (LOVENOX) injection  40 mg Subcutaneous Q24H   PRN Medications: docusate sodium, fentaNYL (SUBLIMAZE) injection, fentaNYL (SUBLIMAZE) injection, polyethylene glycol Hemodynamic parameters:   Intake/Output: No  intake/output data recorded.  Ventilator  Settings: Vent Mode: AC FiO2 (%):  [35 %] 35 % Set Rate:  [20 bmp] 20 bmp Vt Set:  [500 mL] 500 mL PEEP:  [5 cmH20] 5 cmH20    LAB RESULTS:  Basic Metabolic Panel: Recent Labs  Lab 09/10/2019 0858  NA 135  K 5.1  CL 85*  CO2 38*  GLUCOSE 182*  BUN 29*  CREATININE 0.60  CALCIUM 9.2   Liver Function Tests: Recent Labs  Lab 09/06/2019 0858  AST 35  ALT 41  ALKPHOS 44  BILITOT 1.1  PROT 7.3  ALBUMIN 4.2   No results for input(s): LIPASE, AMYLASE in the last 168 hours. No results for input(s): AMMONIA in the last 168 hours. CBC: Recent Labs  Lab 09/08/2019 0858  WBC 6.9  NEUTROABS 6.1  HGB 11.6*  HCT 38.7  MCV 92.6  PLT 364   Cardiac Enzymes: No results for input(s): CKTOTAL, CKMB, CKMBINDEX, TROPONINI in the last 168 hours. BNP: Invalid input(s): POCBNP CBG: No results for input(s): GLUCAP in the last 168 hours.     IMAGING RESULTS:  Imaging: DG Chest Portable 1 View  Result Date:  09/10/2019 CLINICAL DATA:  Status post intubation and orogastric tube placement. Ex-smoker. EXAM: PORTABLE CHEST 1 VIEW COMPARISON:  Earlier today. FINDINGS: Endotracheal tube tip in satisfactory position. Orogastric tube extending into the stomach. Normal sized heart. Clear lungs with normal vascularity. The lungs remain mildly hyperexpanded. Monitor lead artifact on the left. Lower thoracic spine degenerative changes. IMPRESSION: Endotracheal tube tip in satisfactory position. No acute abnormality. Electronically Signed   By: Claudie Revering M.D.   On: 09/09/2019 11:47   DG Chest Portable 1 View  Result Date: 09/05/2019 CLINICAL DATA:  Shortness of breath EXAM: PORTABLE CHEST 1 VIEW COMPARISON:  Eleven days ago FINDINGS: Normal heart size for technique. There is no edema, consolidation, effusion, or pneumothorax. Large lung volumes with emphysema by 2019 CT. No acute osseous finding. IMPRESSION: 1. No evidence of active disease. 2. COPD.  Electronically Signed   By: Monte Fantasia M.D.   On: 09/12/2019 08:57   DG Abd Portable 1 View  Result Date: 08/29/2019 CLINICAL DATA:  Orogastric tube placement. EXAM: PORTABLE ABDOMEN - 1 VIEW COMPARISON:  02/08/2018 FINDINGS: Orogastric 2 tip in the mid to distal stomach and side hole in the proximal to mid stomach. The included bowel gas pattern is normal. Lumbar and lower thoracic spine degenerative changes. Clear lung bases. IMPRESSION: Orogastric tube tip in the mid to distal stomach and side hole in the proximal to mid stomach. Electronically Signed   By: Claudie Revering M.D.   On: 08/16/2019 11:48   _0 @ DG Chest Portable 1 View  Result Date: 08/28/2019 CLINICAL DATA:  Status post intubation and orogastric tube placement. Ex-smoker. EXAM: PORTABLE CHEST 1 VIEW COMPARISON:  Earlier today. FINDINGS: Endotracheal tube tip in satisfactory position. Orogastric tube extending into the stomach. Normal sized heart. Clear lungs with normal vascularity. The lungs remain mildly hyperexpanded. Monitor lead artifact on the left. Lower thoracic spine degenerative changes. IMPRESSION: Endotracheal tube tip in satisfactory position. No acute abnormality. Electronically Signed   By: Claudie Revering M.D.   On: 08/31/2019 11:47   DG Chest Portable 1 View  Result Date: 08/23/2019 CLINICAL DATA:  Shortness of breath EXAM: PORTABLE CHEST 1 VIEW COMPARISON:  Eleven days ago FINDINGS: Normal heart size for technique. There is no edema, consolidation, effusion, or pneumothorax. Large lung volumes with emphysema by 2019 CT. No acute osseous finding. IMPRESSION: 1. No evidence of active disease. 2. COPD. Electronically Signed   By: Monte Fantasia M.D.   On: 08/29/2019 08:57   DG Abd Portable 1 View  Result Date: 09/05/2019 CLINICAL DATA:  Orogastric tube placement. EXAM: PORTABLE ABDOMEN - 1 VIEW COMPARISON:  02/08/2018 FINDINGS: Orogastric 2 tip in the mid to distal stomach and side hole in the proximal to mid  stomach. The included bowel gas pattern is normal. Lumbar and lower thoracic spine degenerative changes. Clear lung bases. IMPRESSION: Orogastric tube tip in the mid to distal stomach and side hole in the proximal to mid stomach. Electronically Signed   By: Claudie Revering M.D.   On: 08/16/2019 11:48      ASSESSMENT AND PLAN    -Multidisciplinary rounds held today  Acute Hypoxic Respiratory Failure -due to Severe Acute COPD exacerbation continue Full MV support -continue Bronchodilator Therapy -Wean Fio2 and PEEP as tolerated -will perform SAT/SBT when respiratory parameters are met -respiratory viral panel -tracheal aspirate    Acute on chronic diastolic CHF with EF 46-27%  - BNP >1290 -oxygen as needed -Lasix as tolerated -follow up cardiac enzymes as indicated ICU monitoring  Renal Failure-most likely due to ATN -follow chem 7 -follow UO -continue Foley Catheter-assess need daily   NEUROLOGY - intubated and sedated - minimal sedation to achieve a RASS goal: -1 Wake up assessment pending   ID -continue IV abx as prescibed -follow up cultures  GI/Nutrition GI PROPHYLAXIS as indicated DIET-->TF's as tolerated Constipation protocol as indicated  ENDO - ICU hypoglycemic\Hyperglycemia protocol -check FSBS per protocol   ELECTROLYTES -follow labs as needed -replace as needed -pharmacy consultation   DVT/GI PRX ordered -SCDs  TRANSFUSIONS AS NEEDED MONITOR FSBS ASSESS the need for LABS as needed   Critical care provider statement:    Critical care time (minutes):  33   Critical care time was exclusive of:  Separately billable procedures and treating other patients   Critical care was necessary to treat or prevent imminent or life-threatening deterioration of the following conditions:  acute hypoxemic respiratory failure due to acute COPD exacerbation , acute on chronic diastolic chf, multiple comorbid conditions.    Critical care was time spent  personally by me on the following activities:  Development of treatment plan with patient or surrogate, discussions with consultants, evaluation of patient's response to treatment, examination of patient, obtaining history from patient or surrogate, ordering and performing treatments and interventions, ordering and review of laboratory studies and re-evaluation of patient's condition.  I assumed direction of critical care for this patient from another provider in my specialty: no    This document was prepared using Dragon voice recognition software and may include unintentional dictation errors.    Ottie Glazier, M.D.  Division of [facility]

## 2019-08-30 NOTE — Code Documentation (Signed)
7.0 ETT 22@ teeth + color change O2 sat increased to 99%

## 2019-08-30 NOTE — ED Provider Notes (Signed)
[facility] Emergency Department Provider Note  ____________________________________________   First MD Initiated Contact with Patient 08/27/2019 [phone]     (approximate)  I have reviewed the triage vital signs and the nursing notes.   HISTORY  Chief Complaint Respiratory Distress    HPI Belinda Jenkins is a 69 y.o. female with history of COPD on chronic oxygen, diabetes, here with  shortness of breath.  History is somewhat limited due to severe respiratory distress on arrival.  She reports that she began feeling bad yesterday.  She became increasingly dyspneic throughout the day and called EMS this morning.  Per EMS report, the patient was found tachypneic, in obvious respiratory distress with sats in the 50s by fire.  She was placed on CPAP with gradual improvement.  She has been given steroids, 2 duo nebs, and albuterol with gradual improvement.  She states she feels somewhat better, but remains tachypneic and unable to provide much additional history. Denies CP, fever, sputum production.  Level 5 caveat invoked as remainder of history, ROS, and physical exam limited due to patient's respiratory disress   Past Medical History:  Diagnosis Date  . COPD (chronic obstructive pulmonary disease) ([place])   . Diabetes mellitus without complication ([place])   . Family history of adverse reaction to anesthesia    sister - slow to wake after 1 procedure  . Hypertension   . Vertigo     Patient Active Problem List   Diagnosis Date Noted  . Acute hypoxemic respiratory failure ([place]) 08/20/2019  . NPH (normal pressure hydrocephalus) ([place])   . Acute on chronic respiratory failure with hypercapnia ([place]) 08/20/2019  . Acute on chronic respiratory failure with hypoxia and hypercapnia ([place]) 08/19/2019  . Elevated troponin I level   . Essential hypertension   . COPD, very severe ([place]) 11/08/2018  . Chronic hypoxemic respiratory failure ([place]) 11/08/2018  . Hx of colonic  polyps   . Benign neoplasm of rectosigmoid junction     Past Surgical History:  Procedure Laterality Date  . ABDOMINAL HYSTERECTOMY    . BACK SURGERY     L5  . BLEPHAROPLASTY Bilateral   . BREAST SURGERY     lumpectomy(x1), milk duct(x1)  . CATARACT EXTRACTION W/ INTRAOCULAR LENS  IMPLANT, BILATERAL    . COLONOSCOPY WITH PROPOFOL N/A 07/14/2016   Procedure: COLONOSCOPY WITH PROPOFOL;  Surgeon: Lucilla Lame, MD;  Location: [facility];  Service: Endoscopy;  Laterality: N/A;  Diabetic - oral meds requests arrival around 8 AM  . HIP SURGERY Left   . POLYPECTOMY  07/14/2016   Procedure: POLYPECTOMY;  Surgeon: Lucilla Lame, MD;  Location: [facility];  Service: Endoscopy;;    Prior to Admission medications   Medication Sig Start Date End Date Taking? Authorizing Provider  alendronate (FOSAMAX) 70 MG tablet Take 70 mg by mouth once a week.    Yes [provider]  ALPRAZolam Duanne Moron) 0.5 MG tablet Take 0.5 mg by mouth 2 (two) times daily.   Yes [provider]  budesonide (PULMICORT) 0.5 MG/2ML nebulizer solution Take 2 mLs (0.5 mg total) by nebulization 2 (two) times daily. DX:[id number] 12/24/18  Yes Wilhelmina Mcardle, MD  feeding supplement, ENSURE ENLIVE, (ENSURE ENLIVE) LIQD Take 237 mLs by mouth 2 (two) times daily between meals. 08/22/19  Yes Lorella Nimrod, MD  ipratropium-albuterol (DUONEB) 0.5-2.5 (3) MG/3ML SOLN Take 3 mLs by nebulization every 6 (six) hours as needed (shortness of breath or wheezing).    Yes [provider]  metFORMIN (GLUCOPHAGE) 1000 MG tablet Take 1,000 mg by mouth 2 (two) times daily with a meal.   Yes [provider]  metoprolol tartrate (LOPRESSOR) 25 MG tablet Take 1 tablet (25 mg total) by mouth 2 (two) times daily. 08/22/19  Yes Lorella Nimrod, MD  valsartan (DIOVAN) 160 MG tablet Take 160 mg by mouth daily.   Yes [provider]    Allergies Sulfamethoxazole-trimethoprim, Morphine, Oxycodone-acetaminophen,  Sertraline, and Adhesive [tape]  No family history on file.  Social History Social History   Tobacco Use  . Smoking status: Former Smoker    Quit date: 04/15/2003    Years since quitting: 16.3  . Smokeless tobacco: Never Used  Substance Use Topics  . Alcohol use: Yes    Alcohol/week: 1.0 standard drinks    Types: 1 Shots of liquor per week  . Drug use: Never    Review of Systems  Review of Systems  Constitutional: Positive for fatigue. Negative for fever.  HENT: Negative for congestion and sore throat.   Eyes: Negative for visual disturbance.  Respiratory: Positive for cough, shortness of breath and wheezing.   Cardiovascular: Negative for chest pain.  Gastrointestinal: Negative for abdominal pain, diarrhea, nausea and vomiting.  Genitourinary: Negative for flank pain.  Musculoskeletal: Negative for back pain and neck pain.  Skin: Negative for rash and wound.  Neurological: Positive for weakness.     ____________________________________________  PHYSICAL EXAM:      VITAL SIGNS: ED Triage Vitals  Enc Vitals Group     BP 08/24/2019 0842 139/86     Pulse Rate 08/25/2019 0830 (!) 116     Resp 08/22/2019 0836 (!) 28     Temp 09/08/2019 0836 97.6 F (36.4 C)     Temp Source 09/03/2019 0836 Axillary     SpO2 09/07/2019 0830 100 %     Weight 08/27/2019 0842 134 lb 7.7 oz (61 kg)     Height 09/03/2019 0842 _0  (1.575 m)     Head Circumference --      Peak Flow --      Pain Score 09/09/2019 0842 0     Pain Loc --      Pain Edu? --      Excl. in [place]? --      Physical Exam Vitals and nursing note reviewed.  Constitutional:      General: She is in acute distress.     Appearance: She is well-developed.  HENT:     Head: Normocephalic and atraumatic.  Eyes:     Conjunctiva/sclera: Conjunctivae normal.  Cardiovascular:     Rate and Rhythm: Regular rhythm. Tachycardia present.     Heart sounds: Normal heart sounds. No murmur. No friction rub.  Pulmonary:     Effort: Tachypnea,  prolonged expiration and respiratory distress present.     Breath sounds: Decreased air movement present. Wheezing present. No rales.  Abdominal:     General: There is no distension.     Palpations: Abdomen is soft.     Tenderness: There is no abdominal tenderness.  Musculoskeletal:     Cervical back: Neck supple.  Skin:    General: Skin is warm.     Capillary Refill: Capillary refill takes less than 2 seconds.  Neurological:     Mental Status: She is alert and oriented to person, place, and time.     Motor: No abnormal muscle tone.       ____________________________________________   LABS (all labs ordered are listed, but only  abnormal results are displayed)  Labs Reviewed  BLOOD GAS, ARTERIAL - Abnormal; Notable for the following components:      Result Value   pH, Arterial 7.29 (*)    pCO2 arterial 88 (*)    pO2, Arterial 72 (*)    Bicarbonate 42.3 (*)    Acid-Base Excess 11.8 (*)    Allens test (pass/fail) POSITIVE (*)    All other components within normal limits  CBC WITH DIFFERENTIAL/PLATELET - Abnormal; Notable for the following components:   Hemoglobin 11.6 (*)    Lymphs Abs 0.4 (*)    All other components within normal limits  COMPREHENSIVE METABOLIC PANEL - Abnormal; Notable for the following components:   Chloride 85 (*)    CO2 38 (*)    Glucose, Bld 182 (*)    BUN 29 (*)    All other components within normal limits  LACTIC ACID, PLASMA - Abnormal; Notable for the following components:   Lactic Acid, Venous 2.1 (*)    All other components within normal limits  LACTIC ACID, PLASMA - Abnormal; Notable for the following components:   Lactic Acid, Venous 3.0 (*)    All other components within normal limits  BRAIN NATRIURETIC PEPTIDE - Abnormal; Notable for the following components:   B Natriuretic Peptide 1,290.8 (*)    All other components within normal limits  GLUCOSE, CAPILLARY - Abnormal; Notable for the following components:   Glucose-Capillary 165 (*)     All other components within normal limits  GLUCOSE, CAPILLARY - Abnormal; Notable for the following components:   Glucose-Capillary 134 (*)    All other components within normal limits  TROPONIN I (HIGH SENSITIVITY) - Abnormal; Notable for the following components:   Troponin I (High Sensitivity) 55 (*)    All other components within normal limits  TROPONIN I (HIGH SENSITIVITY) - Abnormal; Notable for the following components:   Troponin I (High Sensitivity) 91 (*)    All other components within normal limits  SARS CORONAVIRUS 2 BY RT PCR (HOSPITAL ORDER, [facility])  MRSA PCR SCREENING  CULTURE, BLOOD (ROUTINE X 2)  CULTURE, BLOOD (ROUTINE X 2)  TRIGLYCERIDES  CBC  BASIC METABOLIC PANEL    ____________________________________________  EKG: Sinus tachycardia, ventricular rate 117.  QRS 123, QTc 443.  T wave inversions noted in the anterior precordial leads.  No acute ST elevations or depressions. ________________________________________  RADIOLOGY All imaging, including plain films, CT scans, and ultrasounds, independently reviewed by me, and interpretations confirmed via formal radiology reads.  ED MD interpretation:   Chest x-ray: COPD, no focal abnormality CT Angio: pending  Official radiology report(s): CT Angio Chest PE W and/or Wo Contrast  Addendum Date: 08/15/2019   ADDENDUM REPORT: 08/14/2019 15:26 ADDENDUM: The main pulmonary outflow tract measures 3.2 cm which raises question of a degree of pulmonary arterial hypertension. Electronically Signed   By: Lowella Grip III M.D.   On: 09/12/2019 15:26   Result Date: 09/12/2019 CLINICAL DATA:  Shortness of breath EXAM: CT ANGIOGRAPHY CHEST WITH CONTRAST TECHNIQUE: Multidetector CT imaging of the chest was performed using the standard protocol during bolus administration of intravenous contrast. Multiplanar CT image reconstructions and MIPs were obtained to evaluate the vascular anatomy.  CONTRAST:  77m OMNIPAQUE IOHEXOL 350 MG/ML SOLN COMPARISON:  CT angiogram chest February 08, 2018; chest radiograph Aug 30, 2019 FINDINGS: Cardiovascular: There is no demonstrable pulmonary embolus. There is no thoracic aortic aneurysm or dissection. There are multiple foci of great vessel calcification.  There are foci of aortic atherosclerosis as well as foci of coronary artery calcification. There is no pericardial effusion or pericardial thickening. Mediastinum/Nodes: There is a 5 mm nodular opacity in the left lobe of the thyroid which based on consensus guidelines does not warrant additional imaging surveillance. No larger thyroid lesions evident. There is no appreciable thoracic adenopathy. No esophageal lesions evident. Note that the nasogastric tube passes through the esophagus into the stomach. Lungs/Pleura: Endotracheal tube tip is in the distal trachea. No pneumothorax. There is underlying centrilobular emphysematous change. There is chronic atelectatic change in the lingula, unchanged. There is slight bibasilar scarring. No edema present. No new airspace opacity beyond the changes in the lingula. No pleural effusions. Upper Abdomen: There is upper abdominal aortic atherosclerosis. Nasogastric tube tip in stomach. Visualized upper abdominal structures otherwise appear unremarkable. Musculoskeletal: No blastic or lytic bone lesions. There are foci of degenerative change in the thoracic spine. No chest wall lesions evident. A small focus of calcification is noted adjacent to the lateral humeral head, likely supraspinatus calcific tendinosis. Review of the MIP images confirms the above findings. IMPRESSION: 1. No demonstrable pulmonary embolus. No thoracic aortic aneurysm or dissection. There is aortic atherosclerosis as well as multiple foci of great vessel and coronary artery calcification. 2. Chronic apparent atelectasis in the lingula. Underlying centrilobular emphysematous change. No new opacity. 3.  No  evident adenopathy. 4.  Endotracheal tube tip in distal trachea.  No pneumothorax. 5.  Nasogastric tube tip is in the stomach. Aortic Atherosclerosis (ICD10-I70.0) and Emphysema (ICD10-J43.9). Electronically Signed: By: Lowella Grip III M.D. On: 08/13/2019 14:57   DG Chest Portable 1 View  Result Date: 08/29/2019 CLINICAL DATA:  Status post intubation and orogastric tube placement. Ex-smoker. EXAM: PORTABLE CHEST 1 VIEW COMPARISON:  Earlier today. FINDINGS: Endotracheal tube tip in satisfactory position. Orogastric tube extending into the stomach. Normal sized heart. Clear lungs with normal vascularity. The lungs remain mildly hyperexpanded. Monitor lead artifact on the left. Lower thoracic spine degenerative changes. IMPRESSION: Endotracheal tube tip in satisfactory position. No acute abnormality. Electronically Signed   By: Claudie Revering M.D.   On: 08/20/2019 11:47   DG Chest Portable 1 View  Result Date: 08/18/2019 CLINICAL DATA:  Shortness of breath EXAM: PORTABLE CHEST 1 VIEW COMPARISON:  Eleven days ago FINDINGS: Normal heart size for technique. There is no edema, consolidation, effusion, or pneumothorax. Large lung volumes with emphysema by 2019 CT. No acute osseous finding. IMPRESSION: 1. No evidence of active disease. 2. COPD. Electronically Signed   By: Monte Fantasia M.D.   On: 09/07/2019 08:57   DG Abd Portable 1 View  Result Date: 09/04/2019 CLINICAL DATA:  Orogastric tube placement. EXAM: PORTABLE ABDOMEN - 1 VIEW COMPARISON:  02/08/2018 FINDINGS: Orogastric 2 tip in the mid to distal stomach and side hole in the proximal to mid stomach. The included bowel gas pattern is normal. Lumbar and lower thoracic spine degenerative changes. Clear lung bases. IMPRESSION: Orogastric tube tip in the mid to distal stomach and side hole in the proximal to mid stomach. Electronically Signed   By: Claudie Revering M.D.   On: 08/14/2019 11:48     ____________________________________________  PROCEDURES   Procedure(s) performed (including Critical Care):  .Critical Care Performed by: Duffy Bruce, MD Authorized by: Duffy Bruce, MD   Critical care provider statement:    Critical care time (minutes):  35   Critical care time was exclusive of:  Separately billable procedures and treating other patients and teaching time  Critical care was necessary to treat or prevent imminent or life-threatening deterioration of the following conditions:  Circulatory failure, cardiac failure and respiratory failure   Critical care was time spent personally by me on the following activities:  Development of treatment plan with patient or surrogate, discussions with consultants, evaluation of patient's response to treatment, examination of patient, obtaining history from patient or surrogate, ordering and performing treatments and interventions, ordering and review of laboratory studies, ordering and review of radiographic studies, pulse oximetry, re-evaluation of patient's condition and review of old charts   I assumed direction of critical care for this patient from another provider in my specialty: no   Procedure Name: Intubation Date/Time: 08/24/2019 8:34 PM Performed by: Duffy Bruce, MD Pre-anesthesia Checklist: Patient identified, Patient being monitored, Emergency Drugs available, Timeout performed and Suction available Oxygen Delivery Method: Non-rebreather mask Preoxygenation: Pre-oxygenation with 100% oxygen Induction Type: Rapid sequence Ventilation: Mask ventilation without difficulty Laryngoscope Size: Glidescope and 3 Grade View: Grade I Tube size: 7.0 mm Number of attempts: 1 Airway Equipment and Method: Rigid stylet Placement Confirmation: ETT inserted through vocal cords under direct vision,  CO2 detector and Breath sounds checked- equal and bilateral Secured at: 22 cm Tube secured with: ETT holder Dental Injury:  Teeth and Oropharynx as per pre-operative assessment  Difficulty Due To: Difficulty was unanticipated Future Recommendations: Recommend- induction with short-acting agent, and alternative techniques readily available    .1-3 Lead EKG Interpretation Performed by: Duffy Bruce, MD Authorized by: Duffy Bruce, MD     Interpretation: non-specific     ECG rate:  100-110   ECG rate assessment: tachycardic     Rhythm: sinus tachycardia     Ectopy: none     Conduction: normal   Comments:     Indication: Resp failure, distress    ____________________________________________  INITIAL IMPRESSION / MDM / ASSESSMENT AND PLAN / ED COURSE  As part of my medical decision making, I reviewed the following data within the [facility] notes reviewed and incorporated, Old chart reviewed, Notes from prior ED visits, and [place] Controlled Substance Database       *Belinda Jenkins was evaluated in Emergency Department on 09/07/2019 for the symptoms described in the history of present illness. She was evaluated in the context of the global COVID-19 pandemic, which necessitated consideration that the patient might be at risk for infection with the SARS-CoV-2 virus that causes COVID-19. Institutional protocols and algorithms that pertain to the evaluation of patients at risk for COVID-19 are in a state of rapid change based on information released by regulatory bodies including the CDC and federal and state organizations. These policies and algorithms were followed during the patient's care in the ED.  Some ED evaluations and interventions may be delayed as a result of limited staffing during the pandemic.*  Clinical Course as of Aug 29 2033  Tue Aug 30, 5682  1568 69 year old female here with acute on chronic respiratory failure.  Patient in moderate respiratory distress but awake on arrival.  Chest x-ray is consistent with COPD.  BNP elevated, with likely component of diastolic  heart failure as well and she does appear clinically hypervolemic.  Will start Lasix.  Lactic acid minimally elevated 2.1 which I suspect is due to increased work of breathing and she is afebrile with normal white count no evidence of sepsis.  Troponin and BNP both elevated as mentioned, likely due to CHF component.  Gas shows mild hypercapnia.  Will continue BiPAP and  repeat gas.   [CI]  1230 Patient unfortunately decompensated clinically while here. She became increasingly drowsy. Given her respiratory failure with hypercapnia and increasing drowsiness, concern for ability to protect airway and ventilate. Patient subsequently intubated. Prior to this, I made multiple attempts to call her husband as well as family listed in her chart, and I also confirmed that she was full code as recently as this month. Patient intubated as above. She does have a mild increase in her lactic acid, though I suspect again this is related to increased work of breathing. Nonetheless, given that she does have a severe COPD exacerbation, will cover with Rocephin and azithromycin. Will also check a CT angio given recent hospitalization and hypoxia that seems worse than her baseline.   [CI]    Clinical Course User Index [CI] Duffy Bruce, MD    Medical Decision Making:  As above. Admit to ICU.  ____________________________________________  FINAL CLINICAL IMPRESSION(S) / ED DIAGNOSES  Final diagnoses:  Acute respiratory failure with hypoxia and hypercapnia (HCC)     MEDICATIONS GIVEN DURING THIS VISIT:  Medications  propofol (DIPRIVAN) 1000 MG/100ML infusion (0 mcg/kg/min  61 kg Intravenous Stopped 08/21/2019 1617)  fentaNYL (SUBLIMAZE) injection 50 mcg (has no administration in time range)  docusate sodium (COLACE) capsule 100 mg (has no administration in time range)  polyethylene glycol (MIRALAX / GLYCOLAX) packet 17 g (has no administration in time range)  aspirin chewable tablet 324 mg (324 mg Oral Not Given  08/23/2019 1330)    Or  aspirin suppository 300 mg ( Rectal See Alternative 09/06/2019 1330)  enoxaparin (LOVENOX) injection 40 mg (has no administration in time range)  chlorhexidine gluconate (MEDLINE KIT) (PERIDEX) 0.12 % solution 15 mL (15 mLs Mouth Rinse Given 08/23/2019 1920)  MEDLINE mouth rinse (15 mLs Mouth Rinse Given 09/09/2019 1701)  Chlorhexidine Gluconate Cloth 2 % PADS 6 each (6 each Topical Given 08/20/2019 1510)  fentaNYL 2539mg in NS 2567m(1063mml) infusion-PREMIX (100 mcg/hr Intravenous Rate/Dose Verify 08/16/2019 2000)  fentaNYL (SUBLIMAZE) bolus via infusion 25 mcg (25 mcg Intravenous Bolus from Bag 08/14/2019 1514)  midazolam (VERSED) injection 1 mg (has no administration in time range)  midazolam (VERSED) injection 1 mg (has no administration in time range)  senna-docusate (Senokot-S) tablet 2 tablet (has no administration in time range)  insulin aspart (novoLOG) injection 0-15 Units (2 Units Subcutaneous Given 09/12/2019 1925)  ibuprofen (ADVIL) 100 MG/5ML suspension 600 mg (has no administration in time range)  ipratropium-albuterol (DUONEB) 0.5-2.5 (3) MG/3ML nebulizer solution 3 mL (3 mLs Nebulization Given 09/06/2019 0902)  albuterol (PROVENTIL) (2.5 MG/3ML) 0.083% nebulizer solution 2.5 mg (2.5 mg Nebulization Given 08/20/2019 0902)  magnesium sulfate IVPB 2 g 50 mL (0 g Intravenous Stopped 08/19/2019 0929)  furosemide (LASIX) injection 40 mg (40 mg Intravenous Given 09/07/2019 1055)  etomidate (AMIDATE) injection 15 mg (15 mg Intravenous Given 09/12/2019 1118)  succinylcholine (ANECTINE) injection 91.6 mg (91.6 mg Intravenous Given 09/11/2019 1118)  cefTRIAXone (ROCEPHIN) 2 g in sodium chloride 0.9 % 100 mL IVPB (0 g Intravenous Stopped 08/26/2019 1316)  azithromycin (ZITHROMAX) 500 mg in sodium chloride 0.9 % 250 mL IVPB ( Intravenous Stopped 08/29/2019 1423)  fentaNYL (SUBLIMAZE) injection 50 mcg (50 mcg Intravenous Given 08/31/2019 1140)  fentaNYL (SUBLIMAZE) injection 50 mcg (50 mcg Intravenous Given  08/16/2019 1342)  midazolam (VERSED) injection 1 mg (1 mg Intravenous Given 08/13/2019 1145)  sodium chloride 0.9 % bolus 1,000 mL (0 mLs Intravenous Stopped 08/28/2019 1434)  iohexol (OMNIPAQUE) 350 MG/ML injection 75 mL (75  mLs Intravenous Contrast Given 09/12/2019 1442)  fentaNYL (SUBLIMAZE) injection 25 mcg (0 mcg Intravenous Duplicate 1/77/93 9030)  lactated ringers bolus 250 mL (0 mLs Intravenous Stopped 08/21/2019 1638)  lactated ringers bolus 1,000 mL (1,000 mLs Intravenous New Bag/Given 09/09/2019 1644)     ED Discharge Orders    None       Note:  This document was prepared using Dragon voice recognition software and may include unintentional dictation errors.   Duffy Bruce, MD 09/05/2019 2035

## 2019-08-30 NOTE — ED Notes (Signed)
Assigned bed @ 1329, spoke with RN Mickel Baas.

## 2019-08-31 ENCOUNTER — Inpatient Hospital Stay: Payer: Medicare Other

## 2019-08-31 DIAGNOSIS — R400 Somnolence: Secondary | ICD-10-CM

## 2019-08-31 LAB — CBC
HCT: 36.8 % (ref 36.0–46.0)
Hemoglobin: 11.8 g/dL — ABNORMAL LOW (ref 12.0–15.0)
MCH: 28 pg (ref 26.0–34.0)
MCHC: 32.1 g/dL (ref 30.0–36.0)
MCV: 87.4 fL (ref 80.0–100.0)
Platelets: 377 10*3/uL (ref 150–400)
RBC: 4.21 MIL/uL (ref 3.87–5.11)
RDW: 13.7 % (ref 11.5–15.5)
WBC: 14.2 10*3/uL — ABNORMAL HIGH (ref 4.0–10.5)
nRBC: 0 % (ref 0.0–0.2)

## 2019-08-31 LAB — GLUCOSE, CAPILLARY
Glucose-Capillary: 107 mg/dL — ABNORMAL HIGH (ref 70–99)
Glucose-Capillary: 110 mg/dL — ABNORMAL HIGH (ref 70–99)
Glucose-Capillary: 129 mg/dL — ABNORMAL HIGH (ref 70–99)
Glucose-Capillary: 69 mg/dL — ABNORMAL LOW (ref 70–99)
Glucose-Capillary: 87 mg/dL (ref 70–99)
Glucose-Capillary: 92 mg/dL (ref 70–99)
Glucose-Capillary: 95 mg/dL (ref 70–99)

## 2019-08-31 LAB — BASIC METABOLIC PANEL
Anion gap: 16 — ABNORMAL HIGH (ref 5–15)
BUN: 29 mg/dL — ABNORMAL HIGH (ref 8–23)
CO2: 34 mmol/L — ABNORMAL HIGH (ref 22–32)
Calcium: 8.7 mg/dL — ABNORMAL LOW (ref 8.9–10.3)
Chloride: 85 mmol/L — ABNORMAL LOW (ref 98–111)
Creatinine, Ser: 0.79 mg/dL (ref 0.44–1.00)
GFR calc Af Amer: >60 mL/min (ref >60)
GFR calc non Af Amer: >60 mL/min (ref >60)
Glucose, Bld: 116 mg/dL — ABNORMAL HIGH (ref 70–99)
Potassium: 4.5 mmol/L (ref 3.5–5.1)
Sodium: 135 mmol/L (ref 135–145)

## 2019-08-31 MED ORDER — GADOBUTROL 1 MMOL/ML IV SOLN
6.0000 mL | Freq: Once | INTRAVENOUS | Status: AC | PRN
  Administered 2019-08-31: 6 mL via INTRAVENOUS

## 2019-08-31 MED ORDER — DEXTROSE 5 % IV SOLN
10.0000 mg/kg | Freq: Three times a day (TID) | INTRAVENOUS | Status: DC
  Administered 2019-08-31 – 2019-09-01 (×4): 630 mg via INTRAVENOUS
  Filled 2019-08-31 (×7): qty 12.6

## 2019-08-31 MED ORDER — VANCOMYCIN HCL 1500 MG/300ML IV SOLN
1500.0000 mg | Freq: Once | INTRAVENOUS | Status: AC
  Administered 2019-08-31: 1500 mg via INTRAVENOUS
  Filled 2019-08-31: qty 300

## 2019-08-31 MED ORDER — FENTANYL CITRATE (PF) 100 MCG/2ML IJ SOLN
INTRAMUSCULAR | Status: AC
  Filled 2019-08-31: qty 2

## 2019-08-31 MED ORDER — SODIUM CHLORIDE 0.9 % IV SOLN
2.0000 g | Freq: Two times a day (BID) | INTRAVENOUS | Status: DC
  Administered 2019-08-31 – 2019-09-01 (×3): 2 g via INTRAVENOUS
  Filled 2019-08-31: qty 20
  Filled 2019-08-31 (×2): qty 2
  Filled 2019-08-31 (×2): qty 20
  Filled 2019-08-31: qty 2

## 2019-08-31 MED ORDER — DEXTROSE 50 % IV SOLN
1.0000 | Freq: Once | INTRAVENOUS | Status: AC
  Administered 2019-08-31: 50 mL via INTRAVENOUS

## 2019-08-31 MED ORDER — VANCOMYCIN HCL 500 MG/100ML IV SOLN
500.0000 mg | Freq: Two times a day (BID) | INTRAVENOUS | Status: DC
  Administered 2019-09-01 (×2): 500 mg via INTRAVENOUS
  Filled 2019-08-31 (×4): qty 100

## 2019-08-31 MED ORDER — ETOMIDATE 2 MG/ML IV SOLN
INTRAVENOUS | Status: AC
  Filled 2019-08-31: qty 10

## 2019-08-31 MED ORDER — SODIUM CHLORIDE 0.9% FLUSH: 10.0000 mL | INTRAVENOUS | Status: DC | PRN

## 2019-08-31 MED ORDER — DEXTROSE 50 % IV SOLN
INTRAVENOUS | Status: AC
  Filled 2019-08-31: qty 50

## 2019-08-31 MED ORDER — ROCURONIUM BROMIDE 50 MG/5ML IV SOLN
INTRAVENOUS | Status: AC
  Filled 2019-08-31: qty 1

## 2019-08-31 NOTE — Progress Notes (Signed)
Pt has been anxious all day despite fentanyl gtt, PRN fentanyl, and versed pushes. BP has been fluctuating based on how anxious pt is. She is very tense and rigid. Unable to follow commands off sedation earlier with wake up assessment. Pt went for head CT earlier today which showed moderate infarct and pt's husband was spoken to by Dr. Lanney Gins. Dr. Lanney Gins wants to perform LP on patient to rule out meningitis possibly tomorrow. Pt is resting in bed more restful at this time. HR is elevated.

## 2019-08-31 NOTE — Progress Notes (Signed)
Patient experiencing increased muscle tension, oxygen desaturation into the upper 80s, and an increased heart rate in the 120s. Fentanyl increased to 216mcg and 1mg  of PRN versed given for comfort. Will continue to monitor.   Cameron Ali, RN

## 2019-08-31 NOTE — Progress Notes (Signed)
Dr. Lanney Gins at bedside speaking with patient's husband. He made husband aware of moderate stroke found on CT scan and also got consent for LP to rule out meningitis. Husband understands and has no questions at this time.

## 2019-08-31 NOTE — Progress Notes (Signed)
Initial Nutrition Assessment  DOCUMENTATION CODES:   Not applicable  INTERVENTION:   If tube feeds initiated, recommend:  Vital 1.5 @40ml /hr + Prostat 110ml BID via tube   Free water flushes 69ml q4 hours to maintain tube patency   Regimen provides 1640kcal/day, 95g/day protein and 971ml/day free water   Provide Liquid MVI daily via tube   NUTRITION DIAGNOSIS:   Inadequate oral intake related to inability to eat(pt sedated and ventilated) as evidenced by NPO status.  GOAL:   Provide needs based on ASPEN/SCCM guidelines  MONITOR:   Vent status, Labs, Weight trends, Skin, I & O's  REASON FOR ASSESSMENT:   Ventilator    ASSESSMENT:   69 y/o female with h/o advanced COPD with recurrent admissions, last hospitalized 1 week ago also for AECOPD, CHF, DM, HTN and vertigo who is admitted with COPD exacerbation requiring intubation in the ED  RD familiar with this patient from a recent previous admit. Pt with fairly good appetite and oral intake at baseline. Pt was eating 25-75% of meals during her last admit and drinking chocolate Ensure (she drinks this at home). Per chart, pt remains fairly weight stable at baseline. Pt currently NPO with OGT in place. No plans for tube feeds at this time.   Medications reviewed and include: aspirin, lovenox, insulin, solu-medrol, senokot, ceftriaxone, fentanyl, vancomycin   Labs reviewed: K 4.5 wnl, BUN 29(H) BNP- 1290.8(H)- 5/18 Wbc- 14.2(H) cbgs- 107, 92, 87 x 24 hrs AIC 6.4(H)- 5/7  Patient is currently intubated on ventilator support MV: 8.1 L/min Temp (24hrs), Avg:101 F (38.3 C), Min:99 F (37.2 C), Max:102.1 F (38.9 C)  Propofol: none   MAP- >67mmHg  UOP- 178ml  NUTRITION - FOCUSED PHYSICAL EXAM:    Most Recent Value  Orbital Region  No depletion  Upper Arm Region  No depletion  Thoracic and Lumbar Region  No depletion  Buccal Region  No depletion  Temple Region  Mild depletion  Clavicle Bone Region  No  depletion  Clavicle and Acromion Bone Region  No depletion  Scapular Bone Region  No depletion  Dorsal Hand  No depletion  Patellar Region  Moderate depletion  Anterior Thigh Region  Mild depletion  Posterior Calf Region  Moderate depletion  Edema (RD Assessment)  None  Hair  Reviewed  Eyes  Reviewed  Mouth  Reviewed  Skin  Reviewed  Nails  Reviewed     Diet Order:   Diet Order    None     EDUCATION NEEDS:   Not appropriate for education at this time  Skin:  Skin Assessment: Reviewed RN Assessment  Last BM:  pta  Height:   Ht Readings from Last 1 Encounters:  08/31/19 5' 4.02" (1.626 m)    Weight:   Wt Readings from Last 1 Encounters:  08/31/19 62.8 kg    Ideal Body Weight:  54.5 kg  BMI:  Body mass index is 23.75 kg/m.  Estimated Nutritional Needs:   Kcal:  1632kcal/day  Protein:  85-95g/day  Fluid:  1.4-1.6L/day  Koleen Distance MS, RD, LDN Please refer to [facility] for RD and/or RD on-call/weekend/after hours pager

## 2019-08-31 NOTE — Consult Note (Signed)
Pharmacy Antibiotic Note  Belinda Jenkins is a 69 y.o. female admitted on 09/07/2019 with possible meningitis and severe COPD.  Patient is acutely comatose in the setting of fever (102.1 degrees F).  She also has elevated WBC suspected to be related to IV steroids. Patient will be going for LP.  Also, CTH reveals acute frontal CVA.  Pharmacy has been consulted for vancomycin and acyclovir dosing.    Plan:  Vancomycin Patient received vancomycin loading dose of 1500 mg IV x 1 on 5/19 @1304 .   Will order vancomycin maintenance dose of 500 mg IV BID based on [facility] Vancomycin Nomogram. Goal trough 15-20 in the setting of meningitis.  Pharmacy will order levels and adjust per renal function as clinically appropriate.  Acyclovir Will order acyclovir 630 mg (10 mg/kg) IV q 8 hours empirically.  Pharmacy will continue to monitor and adjust per consult.  Height: 5' 4.02" (162.6 cm) Weight: 62.8 kg (138 lb 7.2 oz) IBW/kg (Calculated) : 54.74  Temp (24hrs), Avg:101 F (38.3 C), Min:99 F (37.2 C), Max:102.1 F (38.9 C)  Recent Labs  Lab 08/19/2019 0858 08/26/2019 1058 08/31/19 0405  WBC 6.9  --  14.2*  CREATININE 0.60  --  0.79  LATICACIDVEN 2.1* 3.0*  --     Estimated Creatinine Clearance: 57.3 mL/min (by C-G formula based on SCr of 0.79 mg/dL).    Allergies  Allergen Reactions  . Sulfamethoxazole-Trimethoprim Other (See Comments)    Sulfa causes tingling  . Morphine     Other reaction(s): Other (See Comments) Morphine causes mind altering.  . Oxycodone-Acetaminophen     Other reaction(s): Other (See Comments) Percocet causes mind altering.  . Sertraline Other (See Comments)    Sertraline causes worsening depression.  . Adhesive [Tape] Rash    Some bandaids cause rash    Antimicrobials this admission: Azithromycin 5/18 x 1 Ceftriaxone 5/18 >> Vancomycin 5/19 >> Acyclovir 5/19 >>  Dose adjustments this admission: None  Microbiology results: 5/18 Bcx:  NG x 24  hours 5/18 MRSA PCR:  Negative 5/18 SARS Coronavirus 2:  Negative  Thank you for allowing pharmacy to be a part of this patient's care.  Gerald Dexter, PharmD 08/31/2019 1:52 PM

## 2019-08-31 NOTE — Progress Notes (Signed)
Patient transported to CT and back with no issues.

## 2019-08-31 NOTE — Progress Notes (Signed)
Because patient is scheduled for an MRI, all jewelry was removed. A silver medical alert bracelet with the inscription "TYPE 2 DIABETES," and two gold rings with clear crystals were removed. They were placed in a labeled specimen cup and then placed in the patient's belongings bag. The belongings bag, along with her clothes are in the cabinet in ICU 04.  Cameron Ali, RN

## 2019-08-31 NOTE — Progress Notes (Signed)
Pt returned from CT at this time. VSS.

## 2019-08-31 NOTE — Progress Notes (Signed)
CRITICAL CARE PROGRESS NOTE    Name: Belinda Jenkins MRN: [id number] DOB: 01-07-1951     LOS: 1   SUBJECTIVE FINDINGS & SIGNIFICANT EVENTS   Patient description:  69 yo F with advanced COPD recurrent admissions, last hospitalized May 2021 1 week ago also for AECOPD. She was intubated in ED due to failure of BIPAP after coming in hypoxemic in respiratory distress.  Upon arrival patient was hypotensive.    Lines / Drains: PIVx2  Cultures / Sepsis markers: LP with atypical profile suggestive of meningitis  Antibiotics: Acyclovir, Rocephin, vanco empirically for possible menigitis   Protocols / Consultants: Neurology   Tests / Events: CTH + acute left frontal CVA  Overnight: Febrile despite tylenol   PAST MEDICAL HISTORY   Past Medical History:  Diagnosis Date   COPD (chronic obstructive pulmonary disease) (HCC)    Diabetes mellitus without complication (HCC)    Family history of adverse reaction to anesthesia    sister - slow to wake after 1 procedure   Hypertension    Vertigo      SURGICAL HISTORY   Past Surgical History:  Procedure Laterality Date   ABDOMINAL HYSTERECTOMY     BACK SURGERY     L5   BLEPHAROPLASTY Bilateral    BREAST SURGERY     lumpectomy(x1), milk duct(x1)   CATARACT EXTRACTION W/ INTRAOCULAR LENS  IMPLANT, BILATERAL     COLONOSCOPY WITH PROPOFOL N/A 07/14/2016   Procedure: COLONOSCOPY WITH PROPOFOL;  Surgeon: Lucilla Lame, MD;  Location: [facility];  Service: Endoscopy;  Laterality: N/A;  Diabetic - oral meds requests arrival around 8 AM   HIP SURGERY Left    POLYPECTOMY  07/14/2016   Procedure: POLYPECTOMY;  Surgeon: Lucilla Lame, MD;  Location: [facility];  Service: Endoscopy;;     FAMILY HISTORY   No family history on  file.   SOCIAL HISTORY   Social History   Tobacco Use   Smoking status: Former Smoker    Quit date: 04/15/2003    Years since quitting: 16.3   Smokeless tobacco: Never Used  Substance Use Topics   Alcohol use: Yes    Alcohol/week: 1.0 standard drinks    Types: 1 Shots of liquor per week   Drug use: Never     MEDICATIONS   Current Medication:  Current Facility-Administered Medications:    acyclovir (ZOVIRAX) 630 mg in dextrose 5 % 100 mL IVPB, 10 mg/kg, Intravenous, Q8H, Kolby Schara, MD   aspirin chewable tablet 324 mg, 324 mg, Oral, NOW **OR** aspirin suppository 300 mg, 300 mg, Rectal, NOW, Lanney Gins, Kaydin Karbowski, MD   budesonide (PULMICORT) nebulizer solution 0.5 mg, 0.5 mg, Nebulization, BID, Darel Hong D, NP, 0.5 mg at 08/31/19 0732   cefTRIAXone (ROCEPHIN) 2 g in sodium chloride 0.9 % 100 mL IVPB, 2 g, Intravenous, Q12H, Elian Gloster, MD, Last Rate: 200 mL/hr at 08/31/19 1201, Rate Verify at 08/31/19 1201   chlorhexidine gluconate (MEDLINE KIT) (PERIDEX) 0.12 % solution 15 mL, 15 mL, Mouth Rinse, BID, Abrey Bradway, MD, 15 mL at 08/31/19 0729   Chlorhexidine Gluconate Cloth 2 % PADS 6 each, 6 each, Topical, Daily, Kellsie Grindle, MD, 6 each at 09/02/2019 1510   docusate sodium (COLACE) capsule 100 mg, 100 mg, Oral, BID PRN, Ottie Glazier, MD   enoxaparin (LOVENOX) injection 40 mg, 40 mg, Subcutaneous, Q24H, Lakelyn Straus, MD, 40 mg at 08/22/2019 2100   fentaNYL (SUBLIMAZE) bolus via infusion 25 mcg, 25 mcg, Intravenous, Q15 min PRN, Garett Tetzloff,  MD, 25 mcg at 09/05/2019 1514   fentaNYL (SUBLIMAZE) injection 50 mcg, 50 mcg, Intravenous, Q2H PRN, Duffy Bruce, MD   fentaNYL 2554mg in NS 2567m(1066mml) infusion-PREMIX, 25-200 mcg/hr, Intravenous, Continuous, AleOttie GlazierD, Stopped at 08/31/19 1151   insulin aspart (novoLOG) injection 0-15 Units, 0-15 Units, Subcutaneous, Q4H, AleOttie GlazierD, 2 Units at 08/20/2019 2333    ipratropium-albuterol (DUONEB) 0.5-2.5 (3) MG/3ML nebulizer solution 3 mL, 3 mL, Nebulization, Q6H, KeeDarel Hong NP, 3 mL at 08/31/19 0732   MEDLINE mouth rinse, 15 mL, Mouth Rinse, 10 times per day, AleOttie GlazierD, 15 mL at 08/31/19 1122   methylPREDNISolone sodium succinate (SOLU-MEDROL) 40 mg/mL injection 40 mg, 40 mg, Intravenous, Q24H, KeeDarel Hong NP, 40 mg at 09/02/2019 2138   midazolam (VERSED) injection 1 mg, 1 mg, Intravenous, Q15 min PRN, AleOttie GlazierD   midazolam (VERSED) injection 1 mg, 1 mg, Intravenous, Q2H PRN, AleOttie GlazierD, 1 mg at 08/31/19 0142   polyethylene glycol (MIRALAX / GLYCOLAX) packet 17 g, 17 g, Oral, Daily PRN, AleOttie GlazierD   propofol (DIPRIVAN) 1000 MG/100ML infusion, 0-50 mcg/kg/min, Intravenous, Continuous, IsaDuffy BruceD, Stopped at 08/31/2019 1617   senna-docusate (Senokot-S) tablet 2 tablet, 2 tablet, Per Tube, BID, AleOttie GlazierD, 2 tablet at 08/31/19 0911   vancomycin (VANCOREADY) IVPB 1500 mg/300 mL, 1,500 mg, Intravenous, Once, AleOttie GlazierD    ALLERGIES   Sulfamethoxazole-trimethoprim, Morphine, Oxycodone-acetaminophen, Sertraline, and Adhesive [tape]    REVIEW OF SYSTEMS     10 point ROS unable to obtain due to comatose state on MV  PHYSICAL EXAMINATION   Vital Signs: Temp:  [99 F (37.2 C)-102.1 F (38.9 C)] 99.5 F (37.5 C) (05/19 1200) Pulse Rate:  [82-115] 106 (05/19 1200) Resp:  [14-31] 18 (05/19 1200) BP: (47-191)/(27-154) 93/55 (05/19 1200) SpO2:  [57 %-100 %] 99 % (05/19 1200) FiO2 (%):  [28 %-35 %] 28 % (05/19 1200) Weight:  [59.1 kg-62.8 kg] 62.8 kg (05/19 0329)  GENERAL:NAD acutely comatose HEAD: Normocephalic, atraumatic.  EYES: Pupils equal, round, reactive to light.  No scleral icterus.  MOUTH: Moist mucosal membrane. NECK: Supple. No thyromegaly. No nodules. No JVD.  PULMONARY: mild rhonci bilaterally  CARDIOVASCULAR: S1 and S2. Regular rate and rhythm. No  murmurs, rubs, or gallops.  GASTROINTESTINAL: Soft, nontender, non-distended. No masses. Positive bowel sounds. No hepatosplenomegaly.  MUSCULOSKELETAL: No swelling, clubbing, or edema.  NEUROLOGIC: GCS4T SKIN:intact,warm,dry   PERTINENT DATA     Infusions:  acyclovir     cefTRIAXone (ROCEPHIN)  IV 200 mL/hr at 08/31/19 1201   fentaNYL infusion INTRAVENOUS Stopped (08/31/19 1151)   propofol (DIPRIVAN) infusion Stopped (08/22/2019 1617)   vancomycin     Scheduled Medications:  aspirin  324 mg Oral NOW   Or   aspirin  300 mg Rectal NOW   budesonide (PULMICORT) nebulizer solution  0.5 mg Nebulization BID   chlorhexidine gluconate (MEDLINE KIT)  15 mL Mouth Rinse BID   Chlorhexidine Gluconate Cloth  6 each Topical Daily   enoxaparin (LOVENOX) injection  40 mg Subcutaneous Q24H   insulin aspart  0-15 Units Subcutaneous Q4H   ipratropium-albuterol  3 mL Nebulization Q6H   mouth rinse  15 mL Mouth Rinse 10 times per day   methylPREDNISolone (SOLU-MEDROL) injection  40 mg Intravenous Q24H   senna-docusate  2 tablet Per Tube BID   PRN Medications: docusate sodium, fentaNYL, fentaNYL (SUBLIMAZE) injection, midazolam, midazolam, polyethylene glycol Hemodynamic parameters:   Intake/Output: 05/18 0701 - 05/19 0700 In:  2067.4 [I.V.:223; IV Piggyback:1844.3] Out: 1865 [Urine:1765; Emesis/NG output:100]  Ventilator  Settings: Vent Mode: PRVC FiO2 (%):  [28 %-35 %] 28 % Set Rate:  [20 bmp] 20 bmp Vt Set:  [500 mL] 500 mL PEEP:  [5 cmH20] 5 cmH20 Plateau Pressure:  [23 cmH20-24 cmH20] 23 cmH20    LAB RESULTS:  Basic Metabolic Panel: Recent Labs  Lab 08/23/2019 0858 08/31/19 0405  NA 135 135  K 5.1 4.5  CL 85* 85*  CO2 38* 34*  GLUCOSE 182* 116*  BUN 29* 29*  CREATININE 0.60 0.79  CALCIUM 9.2 8.7*   Liver Function Tests: Recent Labs  Lab 08/19/2019 0858  AST 35  ALT 41  ALKPHOS 44  BILITOT 1.1  PROT 7.3  ALBUMIN 4.2   No results for input(s):  LIPASE, AMYLASE in the last 168 hours. No results for input(s): AMMONIA in the last 168 hours. CBC: Recent Labs  Lab 08/27/2019 0858 08/31/19 0405  WBC 6.9 14.2*  NEUTROABS 6.1  --   HGB 11.6* 11.8*  HCT 38.7 36.8  MCV 92.6 87.4  PLT 364 377   Cardiac Enzymes: No results for input(s): CKTOTAL, CKMB, CKMBINDEX, TROPONINI in the last 168 hours. BNP: Invalid input(s): POCBNP CBG: Recent Labs  Lab 09/03/2019 1918 08/13/2019 2320 08/31/19 0322 08/31/19 0709 08/31/19 1108  GLUCAP 134* 136* 107* 92 87       IMAGING RESULTS:  Imaging: CT HEAD WO CONTRAST  Result Date: 08/31/2019 CLINICAL DATA:  Altered mental status yesterday prior to intubation EXAM: CT HEAD WITHOUT CONTRAST TECHNIQUE: Contiguous axial images were obtained from the base of the skull through the vertex without intravenous contrast. COMPARISON:  08/19/2019 FINDINGS: Brain: Moderate area of gray-white differentiation loss along the lateral left frontal lobe. Ventriculomegaly that is generalized and out of proportion to atrophy, with disproportionate subarachnoid spaces. Negative for hemorrhage or masslike finding. Vascular: Atherosclerotic calcification.  No hyperdense vessel. Skull: Normal. Negative for fracture or focal lesion. Sinuses/Orbits: No acute finding.  Bilateral cataract resection Other: These results will be called ASAP to the ordering clinician or representative by the Radiologist Assistant, and communication documented in the PACS or [employer]. IMPRESSION: 1. Moderate acute infarct in the lateral left frontal lobe. 2. 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 Electronically Signed   By: Monte Fantasia M.D.   On: 08/31/2019 11:57   CT Angio Chest PE W and/or Wo Contrast  Addendum Date: 09/08/2019   ADDENDUM REPORT: 09/02/2019 15:26 ADDENDUM: The main pulmonary outflow tract measures 3.2 cm which raises question of a degree of pulmonary arterial hypertension.  Electronically Signed   By: Lowella Grip III M.D.   On: 08/26/2019 15:26   Result Date: 08/29/2019 CLINICAL DATA:  Shortness of breath EXAM: CT ANGIOGRAPHY CHEST WITH CONTRAST TECHNIQUE: Multidetector CT imaging of the chest was performed using the standard protocol during bolus administration of intravenous contrast. Multiplanar CT image reconstructions and MIPs were obtained to evaluate the vascular anatomy. CONTRAST:  37m OMNIPAQUE IOHEXOL 350 MG/ML SOLN COMPARISON:  CT angiogram chest February 08, 2018; chest radiograph Aug 30, 2019 FINDINGS: Cardiovascular: There is no demonstrable pulmonary embolus. There is no thoracic aortic aneurysm or dissection. There are multiple foci of great vessel calcification. There are foci of aortic atherosclerosis as well as foci of coronary artery calcification. There is no pericardial effusion or pericardial thickening. Mediastinum/Nodes: There is a 5 mm nodular opacity in the left lobe of the thyroid which based on consensus guidelines does not warrant additional imaging surveillance.  No larger thyroid lesions evident. There is no appreciable thoracic adenopathy. No esophageal lesions evident. Note that the nasogastric tube passes through the esophagus into the stomach. Lungs/Pleura: Endotracheal tube tip is in the distal trachea. No pneumothorax. There is underlying centrilobular emphysematous change. There is chronic atelectatic change in the lingula, unchanged. There is slight bibasilar scarring. No edema present. No new airspace opacity beyond the changes in the lingula. No pleural effusions. Upper Abdomen: There is upper abdominal aortic atherosclerosis. Nasogastric tube tip in stomach. Visualized upper abdominal structures otherwise appear unremarkable. Musculoskeletal: No blastic or lytic bone lesions. There are foci of degenerative change in the thoracic spine. No chest wall lesions evident. A small focus of calcification is noted adjacent to the lateral humeral  head, likely supraspinatus calcific tendinosis. Review of the MIP images confirms the above findings. IMPRESSION: 1. No demonstrable pulmonary embolus. No thoracic aortic aneurysm or dissection. There is aortic atherosclerosis as well as multiple foci of great vessel and coronary artery calcification. 2. Chronic apparent atelectasis in the lingula. Underlying centrilobular emphysematous change. No new opacity. 3.  No evident adenopathy. 4.  Endotracheal tube tip in distal trachea.  No pneumothorax. 5.  Nasogastric tube tip is in the stomach. Aortic Atherosclerosis (ICD10-I70.0) and Emphysema (ICD10-J43.9). Electronically Signed: By: Lowella Grip III M.D. On: 08/14/2019 14:57   DG Chest Portable 1 View  Result Date: 08/21/2019 CLINICAL DATA:  Status post intubation and orogastric tube placement. Ex-smoker. EXAM: PORTABLE CHEST 1 VIEW COMPARISON:  Earlier today. FINDINGS: Endotracheal tube tip in satisfactory position. Orogastric tube extending into the stomach. Normal sized heart. Clear lungs with normal vascularity. The lungs remain mildly hyperexpanded. Monitor lead artifact on the left. Lower thoracic spine degenerative changes. IMPRESSION: Endotracheal tube tip in satisfactory position. No acute abnormality. Electronically Signed   By: Claudie Revering M.D.   On: 09/10/2019 11:47   DG Chest Portable 1 View  Result Date: 08/29/2019 CLINICAL DATA:  Shortness of breath EXAM: PORTABLE CHEST 1 VIEW COMPARISON:  Eleven days ago FINDINGS: Normal heart size for technique. There is no edema, consolidation, effusion, or pneumothorax. Large lung volumes with emphysema by 2019 CT. No acute osseous finding. IMPRESSION: 1. No evidence of active disease. 2. COPD. Electronically Signed   By: Monte Fantasia M.D.   On: 08/14/2019 08:57   DG Abd Portable 1 View  Result Date: 08/25/2019 CLINICAL DATA:  Orogastric tube placement. EXAM: PORTABLE ABDOMEN - 1 VIEW COMPARISON:  02/08/2018 FINDINGS: Orogastric 2 tip in the  mid to distal stomach and side hole in the proximal to mid stomach. The included bowel gas pattern is normal. Lumbar and lower thoracic spine degenerative changes. Clear lung bases. IMPRESSION: Orogastric tube tip in the mid to distal stomach and side hole in the proximal to mid stomach. Electronically Signed   By: Claudie Revering M.D.   On: 09/08/2019 11:48   @PROBHOSP @ CT HEAD WO CONTRAST  Result Date: 08/31/2019 CLINICAL DATA:  Altered mental status yesterday prior to intubation EXAM: CT HEAD WITHOUT CONTRAST TECHNIQUE: Contiguous axial images were obtained from the base of the skull through the vertex without intravenous contrast. COMPARISON:  08/19/2019 FINDINGS: Brain: Moderate area of gray-white differentiation loss along the lateral left frontal lobe. Ventriculomegaly that is generalized and out of proportion to atrophy, with disproportionate subarachnoid spaces. Negative for hemorrhage or masslike finding. Vascular: Atherosclerotic calcification.  No hyperdense vessel. Skull: Normal. Negative for fracture or focal lesion. Sinuses/Orbits: No acute finding.  Bilateral cataract resection Other: These results will be  called ASAP to the ordering clinician or representative by the Radiologist Assistant, and communication documented in the PACS or [employer]. IMPRESSION: 1. Moderate acute infarct in the lateral left frontal lobe. 2. 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 Electronically Signed   By: Monte Fantasia M.D.   On: 08/31/2019 11:57   CT Angio Chest PE W and/or Wo Contrast  Addendum Date: 08/27/2019   ADDENDUM REPORT: 08/17/2019 15:26 ADDENDUM: The main pulmonary outflow tract measures 3.2 cm which raises question of a degree of pulmonary arterial hypertension. Electronically Signed   By: Lowella Grip III M.D.   On: 09/11/2019 15:26   Result Date: 08/18/2019 CLINICAL DATA:  Shortness of breath EXAM: CT ANGIOGRAPHY CHEST WITH CONTRAST TECHNIQUE:  Multidetector CT imaging of the chest was performed using the standard protocol during bolus administration of intravenous contrast. Multiplanar CT image reconstructions and MIPs were obtained to evaluate the vascular anatomy. CONTRAST:  65m OMNIPAQUE IOHEXOL 350 MG/ML SOLN COMPARISON:  CT angiogram chest February 08, 2018; chest radiograph Aug 30, 2019 FINDINGS: Cardiovascular: There is no demonstrable pulmonary embolus. There is no thoracic aortic aneurysm or dissection. There are multiple foci of great vessel calcification. There are foci of aortic atherosclerosis as well as foci of coronary artery calcification. There is no pericardial effusion or pericardial thickening. Mediastinum/Nodes: There is a 5 mm nodular opacity in the left lobe of the thyroid which based on consensus guidelines does not warrant additional imaging surveillance. No larger thyroid lesions evident. There is no appreciable thoracic adenopathy. No esophageal lesions evident. Note that the nasogastric tube passes through the esophagus into the stomach. Lungs/Pleura: Endotracheal tube tip is in the distal trachea. No pneumothorax. There is underlying centrilobular emphysematous change. There is chronic atelectatic change in the lingula, unchanged. There is slight bibasilar scarring. No edema present. No new airspace opacity beyond the changes in the lingula. No pleural effusions. Upper Abdomen: There is upper abdominal aortic atherosclerosis. Nasogastric tube tip in stomach. Visualized upper abdominal structures otherwise appear unremarkable. Musculoskeletal: No blastic or lytic bone lesions. There are foci of degenerative change in the thoracic spine. No chest wall lesions evident. A small focus of calcification is noted adjacent to the lateral humeral head, likely supraspinatus calcific tendinosis. Review of the MIP images confirms the above findings. IMPRESSION: 1. No demonstrable pulmonary embolus. No thoracic aortic aneurysm or  dissection. There is aortic atherosclerosis as well as multiple foci of great vessel and coronary artery calcification. 2. Chronic apparent atelectasis in the lingula. Underlying centrilobular emphysematous change. No new opacity. 3.  No evident adenopathy. 4.  Endotracheal tube tip in distal trachea.  No pneumothorax. 5.  Nasogastric tube tip is in the stomach. Aortic Atherosclerosis (ICD10-I70.0) and Emphysema (ICD10-J43.9). Electronically Signed: By: WLowella GripIII M.D. On: 09/07/2019 14:57     ASSESSMENT AND PLAN    -Multidisciplinary rounds held today  Acutely comatose   -In context of febrile illness  - intubated and sedated to achieve a RASS goal: -1 - patient placed on empiric antimicrobials for possible meningitis  - s/p CTH today - acute frontal CVA  - Neurology on case - appreciate input - Dr ZJesusita Okaworkup in order - we discussed findings of acute CVA -continue Full MV support -continue Bronchodilator Therapy -Wean Fio2 and PEEP as tolerated -will perform SAT/SBT when respiratory parameters are met     Acute severe exacerbation of COPD  - continue current therapy   - DuoNEB  - lung auscultation improved   -  CXR without infiltrate ICU telemetry monitoring   ID -continue IV abx as prescibed -follow up cultures  GI/Nutrition GI PROPHYLAXIS as indicated DIET-->TF's as tolerated Constipation protocol as indicated  ENDO - ICU hypoglycemic\Hyperglycemia protocol -check FSBS per protocol   ELECTROLYTES -follow labs as needed -replace as needed -pharmacy consultation   DVT/GI PRX ordered -SCDs  TRANSFUSIONS AS NEEDED MONITOR FSBS ASSESS the need for LABS as needed   Critical care provider statement:    Critical care time (minutes):  33   Critical care time was exclusive of:  Separately billable procedures and treating other patients   Critical care was necessary to treat or prevent imminent or life-threatening deterioration of the following  conditions:  acute CVA, febrile illness with possible meningitis, acute exacerbation of COPD, multiple comorbid conditions.    Critical care was time spent personally by me on the following activities:  Development of treatment plan with patient or surrogate, discussions with consultants, evaluation of patient's response to treatment, examination of patient, obtaining history from patient or surrogate, ordering and performing treatments and interventions, ordering and review of laboratory studies and re-evaluation of patient's condition.  I assumed direction of critical care for this patient from another provider in my specialty: no    This document was prepared using Dragon voice recognition software and may include unintentional dictation errors.    Ottie Glazier, M.D.  Division of [place]

## 2019-08-31 NOTE — Progress Notes (Signed)
Pt off unit for head CT at this time. 

## 2019-08-31 NOTE — Consult Note (Addendum)
Reason for Consult: AMS Requesting Physician: Dr. Lanney Gins  CC: AMS  HPI: Malayka Swagler is an 69 y.o. female COPD recurrent admissions, last hospitalized May 2021 1 week ago also for AECOPD. She was intubated in ED due to failure of BIPAP after coming in hypoxemic in respiratory distress.  Upon arrival patient was hypotensive. Pt ws febrile and started on antibiotics.  Tmax 102.1   Past Medical History:  Diagnosis Date  . COPD (chronic obstructive pulmonary disease) ([place])   . Diabetes mellitus without complication ([place])   . Family history of adverse reaction to anesthesia    sister - slow to wake after 1 procedure  . Hypertension   . Vertigo     Past Surgical History:  Procedure Laterality Date  . ABDOMINAL HYSTERECTOMY    . BACK SURGERY     L5  . BLEPHAROPLASTY Bilateral   . BREAST SURGERY     lumpectomy(x1), milk duct(x1)  . CATARACT EXTRACTION W/ INTRAOCULAR LENS  IMPLANT, BILATERAL    . COLONOSCOPY WITH PROPOFOL N/A 07/14/2016   Procedure: COLONOSCOPY WITH PROPOFOL;  Surgeon: Lucilla Lame, MD;  Location: [facility];  Service: Endoscopy;  Laterality: N/A;  Diabetic - oral meds requests arrival around 8 AM  . HIP SURGERY Left   . POLYPECTOMY  07/14/2016   Procedure: POLYPECTOMY;  Surgeon: Lucilla Lame, MD;  Location: [facility];  Service: Endoscopy;;    No family history on file.  Social History:  reports that she quit smoking about 16 years ago. She has never used smokeless tobacco. She reports current alcohol use of about 1.0 standard drinks of alcohol per week. She reports that she does not use drugs.  Allergies  Allergen Reactions  . Sulfamethoxazole-Trimethoprim Other (See Comments)    Sulfa causes tingling  . Morphine     Other reaction(s): Other (See Comments) Morphine causes mind altering.  . Oxycodone-Acetaminophen     Other reaction(s): Other (See Comments) Percocet causes mind altering.  . Sertraline Other (See Comments)    Sertraline  causes worsening depression.  . Adhesive [Tape] Rash    Some bandaids cause rash    Medications: I have reviewed the patient's current medications.  ROS: Unable to obtain given the sedation   Physical Examination: Blood pressure (!) 123/27, pulse 98, temperature 99 F (37.2 C), temperature source Bladder, resp. rate 20, height 5' 4.02" (1.626 m), weight 62.8 kg, SpO2 100 %.  Pt opens her eyes Has dysconjugate gaze Withdrawal lower extremities and minimal in upper extremities Does not follow commands.    Laboratory Studies:   Basic Metabolic Panel: Recent Labs  Lab 08/17/2019 0858 08/31/19 0405  NA 135 135  K 5.1 4.5  CL 85* 85*  CO2 38* 34*  GLUCOSE 182* 116*  BUN 29* 29*  CREATININE 0.60 0.79  CALCIUM 9.2 8.7*    Liver Function Tests: Recent Labs  Lab 08/29/2019 0858  AST 35  ALT 41  ALKPHOS 44  BILITOT 1.1  PROT 7.3  ALBUMIN 4.2   No results for input(s): LIPASE, AMYLASE in the last 168 hours. No results for input(s): AMMONIA in the last 168 hours.  CBC: Recent Labs  Lab 08/19/2019 0858 08/31/19 0405  WBC 6.9 14.2*  NEUTROABS 6.1  --   HGB 11.6* 11.8*  HCT 38.7 36.8  MCV 92.6 87.4  PLT 364 377    Cardiac Enzymes: No results for input(s): CKTOTAL, CKMB, CKMBINDEX, TROPONINI in the last 168 hours.  BNP: Invalid input(s): POCBNP  CBG: Recent  Labs  Lab 08/15/2019 1918 08/14/2019 2320 08/31/19 0322 08/31/19 0709 08/31/19 1108  GLUCAP 134* 136* 107* 92 41    Microbiology: Results for orders placed or performed during the hospital encounter of 08/13/2019  MRSA PCR Screening     Status: None   Collection Time: 09/12/2019  8:32 AM   Specimen: Nasal Mucosa; Nasopharyngeal  Result Value Ref Range Status   MRSA by PCR NEGATIVE NEGATIVE Final    Comment:        The GeneXpert MRSA Assay (FDA approved for NASAL specimens only), is one component of a comprehensive MRSA colonization surveillance program. It is not intended to diagnose MRSA infection nor  to guide or monitor treatment for MRSA infections. Performed at [facility], [address] [place], [place] [address]   SARS Coronavirus 2 by RT PCR (hospital order, performed in [facility] hospital lab) Nasopharyngeal Nasopharyngeal Swab     Status: None   Collection Time: 09/03/2019 11:51 AM   Specimen: Nasopharyngeal Swab  Result Value Ref Range Status   SARS Coronavirus 2 NEGATIVE NEGATIVE Final    Comment: (NOTE) SARS-CoV-2 target nucleic acids are NOT DETECTED. The SARS-CoV-2 RNA is generally detectable in upper and lower respiratory specimens during the acute phase of infection. The lowest concentration of SARS-CoV-2 viral copies this assay can detect is 250 copies / mL. A negative result does not preclude SARS-CoV-2 infection and should not be used as the sole basis for treatment or other patient management decisions.  A negative result may occur with improper specimen collection / handling, submission of specimen other than nasopharyngeal swab, presence of viral mutation(s) within the areas targeted by this assay, and inadequate number of viral copies (<250 copies / mL). A negative result must be combined with clinical observations, patient history, and epidemiological information. Fact Sheet for Patients:   StrictlyIdeas.no Fact Sheet for Healthcare Providers: BankingDealers.co.za This test is not yet approved or cleared  by the [place] FDA and has been authorized for detection and/or diagnosis of SARS-CoV-2 by FDA under an Emergency Use Authorization (EUA).  This EUA will remain in effect (meaning this test can be used) for the duration of the COVID-19 declaration under Section 564(b)(1) of the Act, 21 U.S.C. section 360bbb-3(b)(1), unless the authorization is terminated or revoked sooner. Performed at [facility], [address] [place], [place] [address]   Blood culture (routine x 2)      Status: None (Preliminary result)   Collection Time: 08/26/2019 11:51 AM   Specimen: BLOOD RIGHT ARM  Result Value Ref Range Status   Specimen Description BLOOD RIGHT ARM  Final   Special Requests   Final    BOTTLES DRAWN AEROBIC AND ANAEROBIC Blood Culture adequate volume   Culture   Final    NO GROWTH < 24 HOURS Performed at [facility], [address] [place], [place] [address]    Report Status PENDING  Incomplete  Blood culture (routine x 2)     Status: None (Preliminary result)   Collection Time: 08/26/2019 11:52 AM   Specimen: BLOOD LEFT ARM  Result Value Ref Range Status   Specimen Description BLOOD LEFT ARM  Final   Special Requests   Final    BOTTLES DRAWN AEROBIC AND ANAEROBIC Blood Culture adequate volume   Culture   Final    NO GROWTH < 24 HOURS Performed at [facility], [address] [place], [place] [address]    Report Status PENDING  Incomplete  Coagulation Studies: No results for input(s): LABPROT, INR in the last 72 hours.  Urinalysis: No results for input(s): COLORURINE, LABSPEC, PHURINE, GLUCOSEU, HGBUR, BILIRUBINUR, KETONESUR, PROTEINUR, UROBILINOGEN, NITRITE, LEUKOCYTESUR in the last 168 hours.  Invalid input(s): APPERANCEUR  Lipid Panel:     Component Value Date/Time   TRIG 119 08/29/2019 1058    HgbA1C:  Lab Results  Component Value Date   HGBA1C 6.4 (H) 08/19/2019    Urine Drug Screen:      Component Value Date/Time   LABOPIA NONE DETECTED 02/25/2018 1259   COCAINSCRNUR NONE DETECTED 02/25/2018 1259   LABBENZ POSITIVE (A) 02/25/2018 1259   AMPHETMU NONE DETECTED 02/25/2018 1259   THCU NONE DETECTED 02/25/2018 1259   LABBARB NONE DETECTED 02/25/2018 1259    Alcohol Level: No results for input(s): ETH in the last 168 hours.  Other results: EKG: normal EKG, normal sinus rhythm, unchanged from previous tracings.  Imaging: CT Angio Chest PE W and/or Wo Contrast  Addendum Date: 09/12/2019   ADDENDUM REPORT:  08/19/2019 15:26 ADDENDUM: The main pulmonary outflow tract measures 3.2 cm which raises question of a degree of pulmonary arterial hypertension. Electronically Signed   By: Lowella Grip III M.D.   On: 09/03/2019 15:26   Result Date: 09/06/2019 CLINICAL DATA:  Shortness of breath EXAM: CT ANGIOGRAPHY CHEST WITH CONTRAST TECHNIQUE: Multidetector CT imaging of the chest was performed using the standard protocol during bolus administration of intravenous contrast. Multiplanar CT image reconstructions and MIPs were obtained to evaluate the vascular anatomy. CONTRAST:  94mL OMNIPAQUE IOHEXOL 350 MG/ML SOLN COMPARISON:  CT angiogram chest February 08, 2018; chest radiograph Aug 30, 2019 FINDINGS: Cardiovascular: There is no demonstrable pulmonary embolus. There is no thoracic aortic aneurysm or dissection. There are multiple foci of great vessel calcification. There are foci of aortic atherosclerosis as well as foci of coronary artery calcification. There is no pericardial effusion or pericardial thickening. Mediastinum/Nodes: There is a 5 mm nodular opacity in the left lobe of the thyroid which based on consensus guidelines does not warrant additional imaging surveillance. No larger thyroid lesions evident. There is no appreciable thoracic adenopathy. No esophageal lesions evident. Note that the nasogastric tube passes through the esophagus into the stomach. Lungs/Pleura: Endotracheal tube tip is in the distal trachea. No pneumothorax. There is underlying centrilobular emphysematous change. There is chronic atelectatic change in the lingula, unchanged. There is slight bibasilar scarring. No edema present. No new airspace opacity beyond the changes in the lingula. No pleural effusions. Upper Abdomen: There is upper abdominal aortic atherosclerosis. Nasogastric tube tip in stomach. Visualized upper abdominal structures otherwise appear unremarkable. Musculoskeletal: No blastic or lytic bone lesions. There are foci of  degenerative change in the thoracic spine. No chest wall lesions evident. A small focus of calcification is noted adjacent to the lateral humeral head, likely supraspinatus calcific tendinosis. Review of the MIP images confirms the above findings. IMPRESSION: 1. No demonstrable pulmonary embolus. No thoracic aortic aneurysm or dissection. There is aortic atherosclerosis as well as multiple foci of great vessel and coronary artery calcification. 2. Chronic apparent atelectasis in the lingula. Underlying centrilobular emphysematous change. No new opacity. 3.  No evident adenopathy. 4.  Endotracheal tube tip in distal trachea.  No pneumothorax. 5.  Nasogastric tube tip is in the stomach. Aortic Atherosclerosis (ICD10-I70.0) and Emphysema (ICD10-J43.9). Electronically Signed: By: Lowella Grip III M.D. On: 08/18/2019 14:57   DG Chest Portable 1 View  Result Date: 08/29/2019 CLINICAL DATA:  Status post intubation and orogastric tube  placement. Ex-smoker. EXAM: PORTABLE CHEST 1 VIEW COMPARISON:  Earlier today. FINDINGS: Endotracheal tube tip in satisfactory position. Orogastric tube extending into the stomach. Normal sized heart. Clear lungs with normal vascularity. The lungs remain mildly hyperexpanded. Monitor lead artifact on the left. Lower thoracic spine degenerative changes. IMPRESSION: Endotracheal tube tip in satisfactory position. No acute abnormality. Electronically Signed   By: Claudie Revering M.D.   On: 08/31/2019 11:47   DG Chest Portable 1 View  Result Date: 08/17/2019 CLINICAL DATA:  Shortness of breath EXAM: PORTABLE CHEST 1 VIEW COMPARISON:  Eleven days ago FINDINGS: Normal heart size for technique. There is no edema, consolidation, effusion, or pneumothorax. Large lung volumes with emphysema by 2019 CT. No acute osseous finding. IMPRESSION: 1. No evidence of active disease. 2. COPD. Electronically Signed   By: Monte Fantasia M.D.   On: 09/12/2019 08:57   DG Abd Portable 1 View  Result Date:  09/11/2019 CLINICAL DATA:  Orogastric tube placement. EXAM: PORTABLE ABDOMEN - 1 VIEW COMPARISON:  02/08/2018 FINDINGS: Orogastric 2 tip in the mid to distal stomach and side hole in the proximal to mid stomach. The included bowel gas pattern is normal. Lumbar and lower thoracic spine degenerative changes. Clear lung bases. IMPRESSION: Orogastric tube tip in the mid to distal stomach and side hole in the proximal to mid stomach. Electronically Signed   By: Claudie Revering M.D.   On: 09/07/2019 11:48     Assessment/Plan:  69 y.o. female COPD recurrent admissions, last hospitalized May 2021 1 week ago also for AECOPD. She was intubated in ED due to failure of BIPAP after coming in hypoxemic in respiratory distress.  Upon arrival patient was hypotensive. Pt ws febrile and started on antibiotics.  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  Don't think this is Normal pressure hydrocephalus.   08/31/2019, 11:52 AM

## 2019-09-01 DIAGNOSIS — J9602 Acute respiratory failure with hypercapnia: Secondary | ICD-10-CM

## 2019-09-01 DIAGNOSIS — J9601 Acute respiratory failure with hypoxia: Secondary | ICD-10-CM

## 2019-09-01 DIAGNOSIS — Z8673 Personal history of transient ischemic attack (TIA), and cerebral infarction without residual deficits: Secondary | ICD-10-CM

## 2019-09-01 LAB — GLUCOSE, CAPILLARY
Glucose-Capillary: 121 mg/dL — ABNORMAL HIGH (ref 70–99)
Glucose-Capillary: 99 mg/dL (ref 70–99)
Glucose-Capillary: 88 mg/dL (ref 70–99)
Glucose-Capillary: 86 mg/dL (ref 70–99)

## 2019-09-01 LAB — BASIC METABOLIC PANEL
Anion gap: 14 (ref 5–15)
BUN: 40 mg/dL — ABNORMAL HIGH (ref 8–23)
CO2: 32 mmol/L (ref 22–32)
Calcium: 7.7 mg/dL — ABNORMAL LOW (ref 8.9–10.3)
Chloride: 87 mmol/L — ABNORMAL LOW (ref 98–111)
Creatinine, Ser: 0.71 mg/dL (ref 0.44–1.00)
GFR calc Af Amer: >60 mL/min (ref >60)
GFR calc non Af Amer: >60 mL/min (ref >60)
Glucose, Bld: 115 mg/dL — ABNORMAL HIGH (ref 70–99)
Potassium: 4.1 mmol/L (ref 3.5–5.1)
Sodium: 133 mmol/L — ABNORMAL LOW (ref 135–145)

## 2019-09-01 LAB — CBC
HCT: 31.1 % — ABNORMAL LOW (ref 36.0–46.0)
Hemoglobin: 9.9 g/dL — ABNORMAL LOW (ref 12.0–15.0)
MCH: 28 pg (ref 26.0–34.0)
MCHC: 31.8 g/dL (ref 30.0–36.0)
MCV: 87.9 fL (ref 80.0–100.0)
Platelets: 336 10*3/uL (ref 150–400)
RBC: 3.54 MIL/uL — ABNORMAL LOW (ref 3.87–5.11)
RDW: 13.8 % (ref 11.5–15.5)
WBC: 13.7 10*3/uL — ABNORMAL HIGH (ref 4.0–10.5)
nRBC: 0 % (ref 0.0–0.2)

## 2019-09-01 MED ORDER — PHENYLEPHRINE HCL-NACL 10-0.9 MG/250ML-% IV SOLN
25.0000 ug/min | INTRAVENOUS | Status: DC
  Administered 2019-09-01: 50 ug/min via INTRAVENOUS
  Administered 2019-09-01: 25 ug/min via INTRAVENOUS
  Filled 2019-09-01 (×2): qty 250

## 2019-09-01 MED ORDER — SODIUM CHLORIDE 0.9 % IV SOLN
25.0000 ug/min | INTRAVENOUS | Status: DC
  Administered 2019-09-01: 25 ug/min via INTRAVENOUS
  Filled 2019-09-01: qty 10

## 2019-09-01 MED ORDER — SODIUM CHLORIDE 0.9 % IV SOLN: 250.0000 mL | INTRAVENOUS | Status: DC

## 2019-09-01 MED ORDER — MIDAZOLAM HCL 2 MG/2ML IJ SOLN
1.0000 mg | INTRAMUSCULAR | Status: DC | PRN
  Administered 2019-09-01 (×4): 2 mg via INTRAVENOUS
  Filled 2019-09-01 (×4): qty 2

## 2019-09-01 MED ORDER — MORPHINE 100MG IN NS 100ML (1MG/ML) PREMIX INFUSION
1.0000 mg/h | INTRAVENOUS | Status: DC
  Administered 2019-09-01: 2 mg/h via INTRAVENOUS
  Filled 2019-09-01: qty 100

## 2019-09-01 MED ORDER — LACTATED RINGERS IV BOLUS: 500.0000 mL | Freq: Once | INTRAVENOUS | Status: DC

## 2019-09-01 MED ORDER — LACTATED RINGERS IV BOLUS
1000.0000 mL | Freq: Once | INTRAVENOUS | Status: AC
  Administered 2019-09-01: 1000 mL via INTRAVENOUS

## 2019-09-02 NOTE — Progress Notes (Addendum)
CDS updated with patient's time of death. Family also updated on patient's death and called to the bedside.  Cameron Ali, RN

## 2019-09-04 LAB — CULTURE, BLOOD (ROUTINE X 2)
Culture: NO GROWTH
Culture: NO GROWTH
Special Requests: ADEQUATE
Special Requests: ADEQUATE

## 2019-09-13 NOTE — Progress Notes (Signed)
Doesn't appear to have fevers overnight.  MRI done with L from stroke and b/l small strokes.     Past Medical History:  Diagnosis Date  . COPD (chronic obstructive pulmonary disease) ([place])   . Diabetes mellitus without complication ([place])   . Family history of adverse reaction to anesthesia    sister - slow to wake after 1 procedure  . Hypertension   . Vertigo     Past Surgical History:  Procedure Laterality Date  . ABDOMINAL HYSTERECTOMY    . BACK SURGERY     L5  . BLEPHAROPLASTY Bilateral   . BREAST SURGERY     lumpectomy(x1), milk duct(x1)  . CATARACT EXTRACTION W/ INTRAOCULAR LENS  IMPLANT, BILATERAL    . COLONOSCOPY WITH PROPOFOL N/A 07/14/2016   Procedure: COLONOSCOPY WITH PROPOFOL;  Surgeon: Lucilla Lame, MD;  Location: [facility];  Service: Endoscopy;  Laterality: N/A;  Diabetic - oral meds requests arrival around 8 AM  . HIP SURGERY Left   . POLYPECTOMY  07/14/2016   Procedure: POLYPECTOMY;  Surgeon: Lucilla Lame, MD;  Location: [facility];  Service: Endoscopy;;    No family history on file.  Social History:  reports that she quit smoking about 16 years ago. She has never used smokeless tobacco. She reports current alcohol use of about 1.0 standard drinks of alcohol per week. She reports that she does not use drugs.  Allergies  Allergen Reactions  . Sulfamethoxazole-Trimethoprim Other (See Comments)    Sulfa causes tingling  . Morphine     Other reaction(s): Other (See Comments) Morphine causes mind altering.  . Oxycodone-Acetaminophen     Other reaction(s): Other (See Comments) Percocet causes mind altering.  . Sertraline Other (See Comments)    Sertraline causes worsening depression.  . Adhesive [Tape] Rash    Some bandaids cause rash    Medications: I have reviewed the patient's current medications.  ROS: Unable to obtain given the sedation   Physical Examination: Blood pressure 113/61, pulse 94, temperature 99.1 F (37.3 C),  temperature source Oral, resp. rate 20, height 5' 4.02" (1.626 m), weight 60.8 kg, SpO2 97 %.  Pt opens her eyes Has dysconjugate gaze Withdrawal lower extremities and minimal in upper extremities Does not follow commands.    Laboratory Studies:   Basic Metabolic Panel: Recent Labs  Lab 08/31/2019 0858 08/31/19 0405 21-Sep-2019 0514  NA 135 135 133*  K 5.1 4.5 4.1  CL 85* 85* 87*  CO2 38* 34* 32  GLUCOSE 182* 116* 115*  BUN 29* 29* 40*  CREATININE 0.60 0.79 0.71  CALCIUM 9.2 8.7* 7.7*    Liver Function Tests: Recent Labs  Lab 09/04/2019 0858  AST 35  ALT 41  ALKPHOS 44  BILITOT 1.1  PROT 7.3  ALBUMIN 4.2   No results for input(s): LIPASE, AMYLASE in the last 168 hours. No results for input(s): AMMONIA in the last 168 hours.  CBC: Recent Labs  Lab 08/31/2019 0858 08/31/19 0405 2019-09-21 0514  WBC 6.9 14.2* 13.7*  NEUTROABS 6.1  --   --   HGB 11.6* 11.8* 9.9*  HCT 38.7 36.8 31.1*  MCV 92.6 87.4 87.9  PLT 364 377 336    Cardiac Enzymes: No results for input(s): CKTOTAL, CKMB, CKMBINDEX, TROPONINI in the last 168 hours.  BNP: Invalid input(s): POCBNP  CBG: Recent Labs  Lab 08/31/19 1602 08/31/19 1917 08/31/19 2348 09/21/2019 0333 09-21-2019 0737  GLUCAP 69* 110* 129* 121* 99    Microbiology: Results for orders placed or  performed during the hospital encounter of 09/04/2019  MRSA PCR Screening     Status: None   Collection Time: 08/22/2019  8:32 AM   Specimen: Nasal Mucosa; Nasopharyngeal  Result Value Ref Range Status   MRSA by PCR NEGATIVE NEGATIVE Final    Comment:        The GeneXpert MRSA Assay (FDA approved for NASAL specimens only), is one component of a comprehensive MRSA colonization surveillance program. It is not intended to diagnose MRSA infection nor to guide or monitor treatment for MRSA infections. Performed at [facility], [address] [place], [place] [address]   SARS Coronavirus 2 by RT PCR (hospital order, performed in  [facility] hospital lab) Nasopharyngeal Nasopharyngeal Swab     Status: None   Collection Time: 08/19/2019 11:51 AM   Specimen: Nasopharyngeal Swab  Result Value Ref Range Status   SARS Coronavirus 2 NEGATIVE NEGATIVE Final    Comment: (NOTE) SARS-CoV-2 target nucleic acids are NOT DETECTED. The SARS-CoV-2 RNA is generally detectable in upper and lower respiratory specimens during the acute phase of infection. The lowest concentration of SARS-CoV-2 viral copies this assay can detect is 250 copies / mL. A negative result does not preclude SARS-CoV-2 infection and should not be used as the sole basis for treatment or other patient management decisions.  A negative result may occur with improper specimen collection / handling, submission of specimen other than nasopharyngeal swab, presence of viral mutation(s) within the areas targeted by this assay, and inadequate number of viral copies (<250 copies / mL). A negative result must be combined with clinical observations, patient history, and epidemiological information. Fact Sheet for Patients:   StrictlyIdeas.no Fact Sheet for Healthcare Providers: BankingDealers.co.za This test is not yet approved or cleared  by the [place] FDA and has been authorized for detection and/or diagnosis of SARS-CoV-2 by FDA under an Emergency Use Authorization (EUA).  This EUA will remain in effect (meaning this test can be used) for the duration of the COVID-19 declaration under Section 564(b)(1) of the Act, 21 U.S.C. section 360bbb-3(b)(1), unless the authorization is terminated or revoked sooner. Performed at [facility], [address] [place], [place] [address]   Blood culture (routine x 2)     Status: None (Preliminary result)   Collection Time: 08/27/2019 11:51 AM   Specimen: BLOOD RIGHT ARM  Result Value Ref Range Status   Specimen Description BLOOD RIGHT ARM  Final   Special Requests    Final    BOTTLES DRAWN AEROBIC AND ANAEROBIC Blood Culture adequate volume   Culture   Final    NO GROWTH 2 DAYS Performed at [facility], [address], [place], [place] [address]    Report Status PENDING  Incomplete  Blood culture (routine x 2)     Status: None (Preliminary result)   Collection Time: 08/29/2019 11:52 AM   Specimen: BLOOD LEFT ARM  Result Value Ref Range Status   Specimen Description BLOOD LEFT ARM  Final   Special Requests   Final    BOTTLES DRAWN AEROBIC AND ANAEROBIC Blood Culture adequate volume   Culture   Final    NO GROWTH 2 DAYS Performed at [facility], [address], [place], [place] [address]    Report Status PENDING  Incomplete    Coagulation Studies: No results for input(s): LABPROT, INR in the last 72 hours.  Urinalysis: No results for input(s): COLORURINE, LABSPEC, PHURINE, GLUCOSEU, HGBUR, BILIRUBINUR, KETONESUR, PROTEINUR, UROBILINOGEN, NITRITE, LEUKOCYTESUR in  the last 168 hours.  Invalid input(s): APPERANCEUR  Lipid Panel:     Component Value Date/Time   TRIG 119 08/20/2019 1058    HgbA1C:  Lab Results  Component Value Date   HGBA1C 6.4 (H) 08/19/2019    Urine Drug Screen:      Component Value Date/Time   LABOPIA NONE DETECTED 02/25/2018 1259   COCAINSCRNUR NONE DETECTED 02/25/2018 1259   LABBENZ POSITIVE (A) 02/25/2018 1259   AMPHETMU NONE DETECTED 02/25/2018 1259   THCU NONE DETECTED 02/25/2018 1259   LABBARB NONE DETECTED 02/25/2018 1259    Alcohol Level: No results for input(s): ETH in the last 168 hours.  Other results: EKG: normal EKG, normal sinus rhythm, unchanged from previous tracings.  Imaging: CT HEAD WO CONTRAST  Result Date: 08/31/2019 CLINICAL DATA:  Altered mental status yesterday prior to intubation EXAM: CT HEAD WITHOUT CONTRAST TECHNIQUE: Contiguous axial images were obtained from the base of the skull through the vertex without intravenous contrast. COMPARISON:  08/19/2019  FINDINGS: Brain: Moderate area of gray-white differentiation loss along the lateral left frontal lobe. Ventriculomegaly that is generalized and out of proportion to atrophy, with disproportionate subarachnoid spaces. Negative for hemorrhage or masslike finding. Vascular: Atherosclerotic calcification.  No hyperdense vessel. Skull: Normal. Negative for fracture or focal lesion. Sinuses/Orbits: No acute finding.  Bilateral cataract resection Other: These results will be called ASAP to the ordering clinician or representative by the Radiologist Assistant, and communication documented in the PACS or [employer]. IMPRESSION: 1. Moderate acute infarct in the lateral left frontal lobe. 2. 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 Electronically Signed   By: Monte Fantasia M.D.   On: 08/31/2019 11:57   CT Angio Chest PE W and/or Wo Contrast  Addendum Date: 09/02/2019   ADDENDUM REPORT: 08/26/2019 15:26 ADDENDUM: The main pulmonary outflow tract measures 3.2 cm which raises question of a degree of pulmonary arterial hypertension. Electronically Signed   By: Lowella Grip III M.D.   On: 09/07/2019 15:26   Result Date: 08/14/2019 CLINICAL DATA:  Shortness of breath EXAM: CT ANGIOGRAPHY CHEST WITH CONTRAST TECHNIQUE: Multidetector CT imaging of the chest was performed using the standard protocol during bolus administration of intravenous contrast. Multiplanar CT image reconstructions and MIPs were obtained to evaluate the vascular anatomy. CONTRAST:  1mL OMNIPAQUE IOHEXOL 350 MG/ML SOLN COMPARISON:  CT angiogram chest February 08, 2018; chest radiograph Aug 30, 2019 FINDINGS: Cardiovascular: There is no demonstrable pulmonary embolus. There is no thoracic aortic aneurysm or dissection. There are multiple foci of great vessel calcification. There are foci of aortic atherosclerosis as well as foci of coronary artery calcification. There is no pericardial effusion or pericardial  thickening. Mediastinum/Nodes: There is a 5 mm nodular opacity in the left lobe of the thyroid which based on consensus guidelines does not warrant additional imaging surveillance. No larger thyroid lesions evident. There is no appreciable thoracic adenopathy. No esophageal lesions evident. Note that the nasogastric tube passes through the esophagus into the stomach. Lungs/Pleura: Endotracheal tube tip is in the distal trachea. No pneumothorax. There is underlying centrilobular emphysematous change. There is chronic atelectatic change in the lingula, unchanged. There is slight bibasilar scarring. No edema present. No new airspace opacity beyond the changes in the lingula. No pleural effusions. Upper Abdomen: There is upper abdominal aortic atherosclerosis. Nasogastric tube tip in stomach. Visualized upper abdominal structures otherwise appear unremarkable. Musculoskeletal: No blastic or lytic bone lesions. There are foci of degenerative change in the thoracic spine. No chest wall  lesions evident. A small focus of calcification is noted adjacent to the lateral humeral head, likely supraspinatus calcific tendinosis. Review of the MIP images confirms the above findings. IMPRESSION: 1. No demonstrable pulmonary embolus. No thoracic aortic aneurysm or dissection. There is aortic atherosclerosis as well as multiple foci of great vessel and coronary artery calcification. 2. Chronic apparent atelectasis in the lingula. Underlying centrilobular emphysematous change. No new opacity. 3.  No evident adenopathy. 4.  Endotracheal tube tip in distal trachea.  No pneumothorax. 5.  Nasogastric tube tip is in the stomach. Aortic Atherosclerosis (ICD10-I70.0) and Emphysema (ICD10-J43.9). Electronically Signed: By: Lowella Grip III M.D. On: 08/23/2019 14:57   MR BRAIN W WO CONTRAST  Result Date: 08/31/2019 CLINICAL DATA:  69 year old female with altered mental status. Left frontal lobe infarct identified on head CT earlier  today. EXAM: MRI HEAD WITHOUT AND WITH CONTRAST TECHNIQUE: Multiplanar, multiecho pulse sequences of the brain and surrounding structures were obtained without and with intravenous contrast. CONTRAST:  66mL GADAVIST GADOBUTROL 1 MMOL/ML IV SOLN COMPARISON:  Head CT earlier today. Recent brain MRI 08/20/2019, and earlier. FINDINGS: Brain: New since 08/20/2019 is a 10 mm area of restricted diffusion in the left caudate nucleus plus nearby left operculum and middle frontal gyrus cortical and subcortical restricted diffusion (series 5, image 22). Additionally, there is punctate scattered bilateral vertex posterior frontal and parietal lobe restricted diffusion (series 3, image 45). There is also a small area of left occipital pole restricted diffusion (image 29). And lastly, there are 1 or 2 small foci of restricted diffusion in the right cerebellum (series 3, image 19). No abnormal enhancement identified. T2 and FLAIR hyperintensity at the largest areas of involvement with no evidence of hemorrhage. No significant mass effect. Stable ventriculomegaly. Stable gray and white matter signal outside of the areas of abnormal diffusion. No midline shift, evidence of mass lesion, extra-axial collection or acute intracranial hemorrhage. Cervicomedullary junction and pituitary are within normal limits. No dural thickening. Vascular: Major intracranial vascular flow voids are stable. Skull and upper cervical spine: Negative for age visible cervical spine. Stable bone marrow signal. Sinuses/Orbits: Stable and negative orbits. Increased mild paranasal sinus mucosal thickening. Other: Intubated. Oral enteric tube. Small volume retained secretions now in the nasopharynx. Mild bilateral mastoid effusions are stable. Grossly negative visible internal auditory structures. IMPRESSION: 1. Left caudate and left middle frontal gyrus infarct corresponding to the CT finding earlier today. But there are also scattered small acute infarcts in  both superior parietal lobes, the left occipital lobe, and the right cerebellum. This pattern suggests a recent embolic event from the heart or proximal aorta. 2. No associated hemorrhage or mass effect. And otherwise stable MRI appearance of the brain since 08/20/2019. Electronically Signed   By: Genevie Ann M.D.   On: 08/31/2019 23:44   DG Chest Portable 1 View  Result Date: 08/29/2019 CLINICAL DATA:  Status post intubation and orogastric tube placement. Ex-smoker. EXAM: PORTABLE CHEST 1 VIEW COMPARISON:  Earlier today. FINDINGS: Endotracheal tube tip in satisfactory position. Orogastric tube extending into the stomach. Normal sized heart. Clear lungs with normal vascularity. The lungs remain mildly hyperexpanded. Monitor lead artifact on the left. Lower thoracic spine degenerative changes. IMPRESSION: Endotracheal tube tip in satisfactory position. No acute abnormality. Electronically Signed   By: Claudie Revering M.D.   On: 08/27/2019 11:47   DG Abd Portable 1 View  Result Date: 08/31/2019 CLINICAL DATA:  Orogastric tube placement. EXAM: PORTABLE ABDOMEN - 1 VIEW COMPARISON:  02/08/2018 FINDINGS: Orogastric  2 tip in the mid to distal stomach and side hole in the proximal to mid stomach. The included bowel gas pattern is normal. Lumbar and lower thoracic spine degenerative changes. Clear lung bases. IMPRESSION: Orogastric tube tip in the mid to distal stomach and side hole in the proximal to mid stomach. Electronically Signed   By: Claudie Revering M.D.   On: 08/31/2019 11:48     Assessment/Plan:  69 y.o. female COPD recurrent admissions, last hospitalized May 2021 1 week ago also for AECOPD. She was intubated in ED due to failure of BIPAP after coming in hypoxemic in respiratory distress.  Upon arrival patient was hypotensive. Pt ws febrile and started on antibiotics.  Tmax 102.1  - AMS suspected in setting of COPD exacerbation/infection and is currently intubated - Tmax 102 with WBC of 14 --> now 13.7 and  no fevers overnight.  - on broad spectrum antibiotics - on exam she has normal tone  - MRI with b/l strokes likely in setting of hypotension. Strokes not significant in size to cause edema/shift.  - titrate fentanyl as possible.

## 2019-09-13 NOTE — Progress Notes (Addendum)
Contacted [employer] and spoke to [employer] about the patient being placed on comfort care. The [facility] representative stated to call them back at cardiac time of death, if the patient is intubated again, or if treatment/care resumes.  [facility] Patient Service Number: 08/25/[id number]  [facility] Phone Number: [phone]  Cameron Ali, RN

## 2019-09-13 NOTE — Progress Notes (Signed)
CRITICAL CARE PROGRESS NOTE    Name: Belinda Jenkins MRN: [id number] DOB: 08/22/1950     LOS: 2   SUBJECTIVE FINDINGS & SIGNIFICANT EVENTS   Patient description:  69 yo F with advanced COPD recurrent admissions, last hospitalized May 2021 1 week ago also for AECOPD. She was intubated in ED due to failure of BIPAP after coming in hypoxemic in respiratory distress.  Upon arrival patient was hypotensive.    Lines / Drains: PIVx2  Cultures / Sepsis markers: LP with atypical profile suggestive of meningitis  Antibiotics: Acyclovir, Rocephin, vanco empirically for possible menigitis   Protocols / Consultants: Neurology   Tests / Events: CTH + acute left frontal CVA  Overnight: Febrile despite tylenol  09-24-19- patient had significant acute CVA, I discussed this with family. Husband who is POA states due to her advanced COPD, her quality of life was poor and they agreed to hospice prior to this admission. I explained to husband that now on top of her end stage COPD she does have CVA and her prognosis is very poor. Husband states that patient would not be agreeable to being in NH and already wanted hospice. They wish to de-escalate  Care due to poor prognosis and agree to perform compassionate extubation. Patient is DNR comfort care with full comfort measure.   PAST MEDICAL HISTORY   Past Medical History:  Diagnosis Date  . COPD (chronic obstructive pulmonary disease) ([place])   . Diabetes mellitus without complication ([place])   . Family history of adverse reaction to anesthesia    sister - slow to wake after 1 procedure  . Hypertension   . Vertigo      SURGICAL HISTORY   Past Surgical History:  Procedure Laterality Date  . ABDOMINAL HYSTERECTOMY    . BACK SURGERY     L5  . BLEPHAROPLASTY  Bilateral   . BREAST SURGERY     lumpectomy(x1), milk duct(x1)  . CATARACT EXTRACTION W/ INTRAOCULAR LENS  IMPLANT, BILATERAL    . COLONOSCOPY WITH PROPOFOL N/A 07/14/2016   Procedure: COLONOSCOPY WITH PROPOFOL;  Surgeon: Lucilla Lame, MD;  Location: [facility];  Service: Endoscopy;  Laterality: N/A;  Diabetic - oral meds requests arrival around 8 AM  . HIP SURGERY Left   . POLYPECTOMY  07/14/2016   Procedure: POLYPECTOMY;  Surgeon: Lucilla Lame, MD;  Location: [facility];  Service: Endoscopy;;     FAMILY HISTORY   No family history on file.   SOCIAL HISTORY   Social History   Tobacco Use  . Smoking status: Former Smoker    Quit date: 04/15/2003    Years since quitting: 16.3  . Smokeless tobacco: Never Used  Substance Use Topics  . Alcohol use: Yes    Alcohol/week: 1.0 standard drinks    Types: 1 Shots of liquor per week  . Drug use: Never     MEDICATIONS   Current Medication:  Current Facility-Administered Medications:  .  0.9 %  sodium chloride infusion, 250 mL, Intravenous, Continuous, Bradly Bienenstock, NP, Stopped at 09/24/2019 0216 .  acyclovir (ZOVIRAX) 630 mg in dextrose 5 % 100 mL IVPB, 10 mg/kg, Intravenous, Q8H, Ottie Glazier, MD, Stopped at 09-24-19 1516 .  budesonide (PULMICORT) nebulizer solution 0.5 mg, 0.5 mg, Nebulization, BID, Darel Hong D, NP, 0.5 mg at 09/24/19 0843 .  cefTRIAXone (ROCEPHIN) 2 g in sodium chloride 0.9 % 100 mL IVPB, 2 g, Intravenous, Q12H, Ottie Glazier, MD, Stopped at 2019-09-24 1150 .  chlorhexidine gluconate (MEDLINE  KIT) (PERIDEX) 0.12 % solution 15 mL, 15 mL, Mouth Rinse, BID, Aleskerov, Fuad, MD, 15 mL at 09-27-19 0814 .  Chlorhexidine Gluconate Cloth 2 % PADS 6 each, 6 each, Topical, Daily, Ottie Glazier, MD, 6 each at September 27, 2019 1112 .  docusate sodium (COLACE) capsule 100 mg, 100 mg, Oral, BID PRN, Ottie Glazier, MD .  enoxaparin (LOVENOX) injection 40 mg, 40 mg, Subcutaneous, Q24H, Lanney Gins, Fuad, MD, 40  mg at 08/31/19 2044 .  fentaNYL (SUBLIMAZE) bolus via infusion 25 mcg, 25 mcg, Intravenous, Q15 min PRN, Ottie Glazier, MD, 25 mcg at 09/12/2019 1514 .  fentaNYL (SUBLIMAZE) injection 50 mcg, 50 mcg, Intravenous, Q2H PRN, Duffy Bruce, MD .  fentaNYL 2539mg in NS 2526m(1079mml) infusion-PREMIX, 25-200 mcg/hr, Intravenous, Continuous, Aleskerov, Fuad, MD, Last Rate: 15 mL/hr at 08/2019-09-1498, 150 mcg/hr at 05/06-15-202100 .  insulin aspart (novoLOG) injection 0-15 Units, 0-15 Units, Subcutaneous, Q4H, AleOttie GlazierD, 2 Units at 08/17/13/202135 .  ipratropium-albuterol (DUONEB) 0.5-2.5 (3) MG/3ML nebulizer solution 3 mL, 3 mL, Nebulization, Q6H, KeeDarel Hong NP, 3 mL at 05/06-15-2121 .  MEDLINE mouth rinse, 15 mL, Mouth Rinse, 10 times per day, AleOttie GlazierD, 15 mL at 08/2019/09/1524 .  methylPREDNISolone sodium succinate (SOLU-MEDROL) 40 mg/mL injection 40 mg, 40 mg, Intravenous, Q24H, KeeDarel Hong NP, 40 mg at 08/31/19 2048 .  midazolam (VERSED) injection 1 mg, 1 mg, Intravenous, Q15 min PRN, AleOttie GlazierD, 1 mg at 08/17/13/2156 .  midazolam (VERSED) injection 1-2 mg, 1-2 mg, Intravenous, Q2H PRN, KeeDarel Hong NP, 2 mg at 08/17/13/202112 .  morphine 100m45m NS 100mL61mg/m17minfusion - premix, 1-10 mg/hr, Intravenous, Continuous, Aleskerov, Fuad, MD .  phenylephrine (NEO-SYNEPHRINE) 10 mg in sodium chloride 0.9 % 250 mL (0.04 mg/mL) infusion, 25-200 mcg/min, Intravenous, Titrated, Aleskerov, Fuad, MD, Last Rate: 37.5 mL/hr at 09/05/2019-06-15 25 mcg/min at 05/20/Sep 27, 2019.  polyethylene glycol (MIRALAX / GLYCOLAX) packet 17 g, 17 g, Oral, Daily PRN, AleskeLanney Gins, MD .  propofol (DIPRIVAN) 1000 MG/100ML infusion, 0-50 mcg/kg/min, Intravenous, Continuous, IsaacsDuffy BruceStopped at 08/15/2019 1617 .  senna-docusate (Senokot-S) tablet 2 tablet, 2 tablet, Per Tube, BID, AleskeOttie Glazier2 tablet at 08/31/04-15-21.  sodium chloride flush (NS) 0.9 % injection 10-40  mL, 10-40 mL, Intracatheter, PRN, AleskeLanney Gins, MD .  vancomycin (VANCOREADY) IVPB 500 mg/100 mL, 500 mg, Intravenous, Q12H, Moran,Gerald Dexter Stopped at 08/31/04-15-21   ALLERGIES   Sulfamethoxazole-trimethoprim, Morphine, Oxycodone-acetaminophen, Sertraline, and Adhesive [tape]    REVIEW OF SYSTEMS     10 point ROS unable to obtain due to comatose state on MV  PHYSICAL EXAMINATION   Vital Signs: Temp:  [98.1 F (36.7 C)-99.5 F (37.5 C)] 98.9 F (37.2 C) (05/20 1230) Pulse Rate:  [62-118] 110 (05/20 1600) Resp:  [17-23] 20 (05/20 1600) BP: (63-179)/(50-164) 143/70 (05/20 1600) SpO2:  [81 %-100 %] 97 % (05/20 1600) FiO2 (%):  [28 %] 28 % (05/20 1600) Weight:  [60.8 kg] 60.8 kg (05/20 0331)  GENERAL:NAD acutely comatose HEAD: Normocephalic, atraumatic.  EYES: Pupils equal, round, reactive to light.  No scleral icterus.  MOUTH: Moist mucosal membrane. NECK: Supple. No thyromegaly. No nodules. No JVD.  PULMONARY: mild rhonci bilaterally  CARDIOVASCULAR: S1 and S2. Regular rate and rhythm. No murmurs, rubs, or gallops.  GASTROINTESTINAL: Soft, nontender, non-distended. No masses. Positive bowel sounds. No hepatosplenomegaly.  MUSCULOSKELETAL: No swelling, clubbing, or edema.  NEUROLOGIC: GCS4T SKIN:intact,warm,dry  PERTINENT DATA     Infusions: . sodium chloride Stopped (2019/09/09 0216)  . acyclovir Stopped (09-09-2019 1516)  . cefTRIAXone (ROCEPHIN)  IV Stopped (09/09/19 1150)  . fentaNYL infusion INTRAVENOUS 150 mcg/hr (Sep 09, 2019 1600)  . morphine    . phenylephrine (NEO-SYNEPHRINE) Adult infusion 25 mcg/min (09-09-19 1600)  . propofol (DIPRIVAN) infusion Stopped (08/28/2019 1617)  . vancomycin Stopped (09/09/19 1351)   Scheduled Medications: . budesonide (PULMICORT) nebulizer solution  0.5 mg Nebulization BID  . chlorhexidine gluconate (MEDLINE KIT)  15 mL Mouth Rinse BID  . Chlorhexidine Gluconate Cloth  6 each Topical Daily  . enoxaparin  (LOVENOX) injection  40 mg Subcutaneous Q24H  . insulin aspart  0-15 Units Subcutaneous Q4H  . ipratropium-albuterol  3 mL Nebulization Q6H  . mouth rinse  15 mL Mouth Rinse 10 times per day  . methylPREDNISolone (SOLU-MEDROL) injection  40 mg Intravenous Q24H  . senna-docusate  2 tablet Per Tube BID   PRN Medications: docusate sodium, fentaNYL, fentaNYL (SUBLIMAZE) injection, midazolam, midazolam, polyethylene glycol, sodium chloride flush Hemodynamic parameters:   Intake/Output: 05/19 0701 - 05/20 0700 In: 2232.7 [I.V.:444.5; IV Piggyback:1788.2] Out: 791 [Urine:415; Emesis/NG output:50]  Ventilator  Settings: Vent Mode: PRVC FiO2 (%):  [28 %] 28 % Set Rate:  [20 bmp] 20 bmp Vt Set:  [500 mL] 500 mL PEEP:  [5 cmH20] 5 cmH20 Plateau Pressure:  [4 cmH20] 4 cmH20    LAB RESULTS:  Basic Metabolic Panel: Recent Labs  Lab 09/10/2019 0858 08/29/2019 0858 08/31/19 0405 09/09/19 0514  NA 135  --  135 133*  K 5.1   < > 4.5 4.1  CL 85*  --  85* 87*  CO2 38*  --  34* 32  GLUCOSE 182*  --  116* 115*  BUN 29*  --  29* 40*  CREATININE 0.60  --  0.79 0.71  CALCIUM 9.2  --  8.7* 7.7*   < > = values in this interval not displayed.   Liver Function Tests: Recent Labs  Lab 08/20/2019 0858  AST 35  ALT 41  ALKPHOS 44  BILITOT 1.1  PROT 7.3  ALBUMIN 4.2   No results for input(s): LIPASE, AMYLASE in the last 168 hours. No results for input(s): AMMONIA in the last 168 hours. CBC: Recent Labs  Lab 09/12/2019 0858 08/31/19 0405 09/09/19 0514  WBC 6.9 14.2* 13.7*  NEUTROABS 6.1  --   --   HGB 11.6* 11.8* 9.9*  HCT 38.7 36.8 31.1*  MCV 92.6 87.4 87.9  PLT 364 377 336   Cardiac Enzymes: No results for input(s): CKTOTAL, CKMB, CKMBINDEX, TROPONINI in the last 168 hours. BNP: Invalid input(s): POCBNP CBG: Recent Labs  Lab 08/31/19 2348 Sep 09, 2019 0333 09-09-19 0737 Sep 09, 2019 1118 2019-09-09 1601  GLUCAP 129* 121* 99 88 86       IMAGING RESULTS:  Imaging: CT HEAD WO  CONTRAST  Result Date: 08/31/2019 CLINICAL DATA:  Altered mental status yesterday prior to intubation EXAM: CT HEAD WITHOUT CONTRAST TECHNIQUE: Contiguous axial images were obtained from the base of the skull through the vertex without intravenous contrast. COMPARISON:  08/19/2019 FINDINGS: Brain: Moderate area of gray-white differentiation loss along the lateral left frontal lobe. Ventriculomegaly that is generalized and out of proportion to atrophy, with disproportionate subarachnoid spaces. Negative for hemorrhage or masslike finding. Vascular: Atherosclerotic calcification.  No hyperdense vessel. Skull: Normal. Negative for fracture or focal lesion. Sinuses/Orbits: No acute finding.  Bilateral cataract resection Other: These results will be called ASAP to the ordering clinician  or representative by the Radiologist Assistant, and communication documented in the PACS or [employer]. IMPRESSION: 1. Moderate acute infarct in the lateral left frontal lobe. 2. 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 Electronically Signed   By: Monte Fantasia M.D.   On: 08/31/2019 11:57   MR BRAIN W WO CONTRAST  Result Date: 08/31/2019 CLINICAL DATA:  69 year old female with altered mental status. Left frontal lobe infarct identified on head CT earlier today. EXAM: MRI HEAD WITHOUT AND WITH CONTRAST TECHNIQUE: Multiplanar, multiecho pulse sequences of the brain and surrounding structures were obtained without and with intravenous contrast. CONTRAST:  76m GADAVIST GADOBUTROL 1 MMOL/ML IV SOLN COMPARISON:  Head CT earlier today. Recent brain MRI 08/20/2019, and earlier. FINDINGS: Brain: New since 08/20/2019 is a 10 mm area of restricted diffusion in the left caudate nucleus plus nearby left operculum and middle frontal gyrus cortical and subcortical restricted diffusion (series 5, image 22). Additionally, there is punctate scattered bilateral vertex posterior frontal and parietal lobe  restricted diffusion (series 3, image 45). There is also a small area of left occipital pole restricted diffusion (image 29). And lastly, there are 1 or 2 small foci of restricted diffusion in the right cerebellum (series 3, image 19). No abnormal enhancement identified. T2 and FLAIR hyperintensity at the largest areas of involvement with no evidence of hemorrhage. No significant mass effect. Stable ventriculomegaly. Stable gray and white matter signal outside of the areas of abnormal diffusion. No midline shift, evidence of mass lesion, extra-axial collection or acute intracranial hemorrhage. Cervicomedullary junction and pituitary are within normal limits. No dural thickening. Vascular: Major intracranial vascular flow voids are stable. Skull and upper cervical spine: Negative for age visible cervical spine. Stable bone marrow signal. Sinuses/Orbits: Stable and negative orbits. Increased mild paranasal sinus mucosal thickening. Other: Intubated. Oral enteric tube. Small volume retained secretions now in the nasopharynx. Mild bilateral mastoid effusions are stable. Grossly negative visible internal auditory structures. IMPRESSION: 1. Left caudate and left middle frontal gyrus infarct corresponding to the CT finding earlier today. But there are also scattered small acute infarcts in both superior parietal lobes, the left occipital lobe, and the right cerebellum. This pattern suggests a recent embolic event from the heart or proximal aorta. 2. No associated hemorrhage or mass effect. And otherwise stable MRI appearance of the brain since 08/20/2019. Electronically Signed   By: HGenevie AnnM.D.   On: 08/31/2019 23:44   _0 @ MR BRAIN W WO CONTRAST  Result Date: 08/31/2019 CLINICAL DATA:  69year old female with altered mental status. Left frontal lobe infarct identified on head CT earlier today. EXAM: MRI HEAD WITHOUT AND WITH CONTRAST TECHNIQUE: Multiplanar, multiecho pulse sequences of the brain and  surrounding structures were obtained without and with intravenous contrast. CONTRAST:  685mGADAVIST GADOBUTROL 1 MMOL/ML IV SOLN COMPARISON:  Head CT earlier today. Recent brain MRI 08/20/2019, and earlier. FINDINGS: Brain: New since 08/20/2019 is a 10 mm area of restricted diffusion in the left caudate nucleus plus nearby left operculum and middle frontal gyrus cortical and subcortical restricted diffusion (series 5, image 22). Additionally, there is punctate scattered bilateral vertex posterior frontal and parietal lobe restricted diffusion (series 3, image 45). There is also a small area of left occipital pole restricted diffusion (image 29). And lastly, there are 1 or 2 small foci of restricted diffusion in the right cerebellum (series 3, image 19). No abnormal enhancement identified. T2 and FLAIR hyperintensity at the largest areas of involvement with no evidence of hemorrhage.  No significant mass effect. Stable ventriculomegaly. Stable gray and white matter signal outside of the areas of abnormal diffusion. No midline shift, evidence of mass lesion, extra-axial collection or acute intracranial hemorrhage. Cervicomedullary junction and pituitary are within normal limits. No dural thickening. Vascular: Major intracranial vascular flow voids are stable. Skull and upper cervical spine: Negative for age visible cervical spine. Stable bone marrow signal. Sinuses/Orbits: Stable and negative orbits. Increased mild paranasal sinus mucosal thickening. Other: Intubated. Oral enteric tube. Small volume retained secretions now in the nasopharynx. Mild bilateral mastoid effusions are stable. Grossly negative visible internal auditory structures. IMPRESSION: 1. Left caudate and left middle frontal gyrus infarct corresponding to the CT finding earlier today. But there are also scattered small acute infarcts in both superior parietal lobes, the left occipital lobe, and the right cerebellum. This pattern suggests a recent  embolic event from the heart or proximal aorta. 2. No associated hemorrhage or mass effect. And otherwise stable MRI appearance of the brain since 08/20/2019. Electronically Signed   By: Genevie Ann M.D.   On: 08/31/2019 23:44     ASSESSMENT AND PLAN    -Multidisciplinary rounds held today  Acutely comatose   -In context of febrile illness  - intubated and sedated to achieve a RASS goal: -1 - patient placed on empiric antimicrobials for possible meningitis  - s/p CTH today - acute frontal CVA  - Neurology on case - appreciate input - Dr Jesusita Oka workup in order - we discussed findings of acute CVA -continue Full MV support -continue Bronchodilator Therapy -Wean Fio2 and PEEP as tolerated -will perform SAT/SBT when respiratory parameters are met     Acute severe exacerbation of COPD  - continue current therapy   - DuoNEB  - lung auscultation improved   - CXR without infiltrate ICU telemetry monitoring   ID -continue IV abx as prescibed -follow up cultures  GI/Nutrition GI PROPHYLAXIS as indicated DIET-->TF's as tolerated Constipation protocol as indicated  ENDO - ICU hypoglycemic\Hyperglycemia protocol -check FSBS per protocol   ELECTROLYTES -follow labs as needed -replace as needed -pharmacy consultation   DVT/GI PRX ordered -SCDs  TRANSFUSIONS AS NEEDED MONITOR FSBS ASSESS the need for LABS as needed   Critical care provider statement:    Critical care time (minutes):  33   Critical care time was exclusive of:  Separately billable procedures and treating other patients   Critical care was necessary to treat or prevent imminent or life-threatening deterioration of the following conditions:  acute CVA, febrile illness with possible meningitis, acute exacerbation of COPD, multiple comorbid conditions.    Critical care was time spent personally by me on the following activities:  Development of treatment plan with patient or surrogate, discussions with  consultants, evaluation of patient's response to treatment, examination of patient, obtaining history from patient or surrogate, ordering and performing treatments and interventions, ordering and review of laboratory studies and re-evaluation of patient's condition.  I assumed direction of critical care for this patient from another provider in my specialty: no    This document was prepared using Dragon voice recognition software and may include unintentional dictation errors.    Ottie Glazier, M.D.  Division of [facility]

## 2019-09-13 NOTE — Progress Notes (Signed)
Pt extubated without complications.

## 2019-09-13 NOTE — Progress Notes (Signed)
Patient extubated and now is comfort care.  Family at bedside.  Fentanyl drip infusing until morphine drip is sent from pharmacy.

## 2019-09-13 NOTE — Progress Notes (Signed)
Shift Summary: - The patient remains stable on the ventilator. Vital signs are currently WDL with the exception of a fluctuating BP (at times it is low, and other times high). Patient responds to voice but still does not follow commands. Her arms and legs remain very tense and difficult to move. She is currently on a fentanyl drip and has a KVO for antibiotics.  - Due to an MRI trip, the patient's temp foley had to be removed and replaced with a regular foley, and the cooling blanket was removed. She has remained afebrile after removing the cooling blanket, and her last temp was 98.1 axillary (0000).  - Patient was successfully taken to MRI this shift and returned to the ICU.  - At 0200 patient was given 2mg  versed PRN for increased HR (115-130). After administration, the patient's BP had a significant drop. Due to this, the patient was given 1L of lactated ringers and initiated on a neo-synephrine drip. Her sedation (fentanyl) was stopped to allow the patient to recover.  - At 0300, the patient was resumed on fentanyl at 75 mcg and the neo was placed on hold due to a normalized BP (130/83). The lactated ringers bolus was completed.  - Patient currently does not appear to be in any distress. Vitals have normalized at this time. Will continue to monitor.  Cameron Ali, RN

## 2019-09-13 NOTE — Consult Note (Signed)
Pharmacy Antibiotic Note  Belinda Jenkins is a 69 y.o. female admitted on 09/10/2019 with possible meningitis and severe COPD.  Patient was acutely comatose in the setting of fever (102.1 degrees F), now afebrile.  She also has elevated WBC suspected to be related to IV steroids, which is trending down slightly. Patient will be going for LP.  Also, CTH reveals acute frontal CVA.  Pharmacy has been consulted for vancomycin and acyclovir dosing.    Plan: Day 2 of antibiotics.  Vancomycin Patient received vancomycin loading dose of 1500 mg IV x 1 on 5/19 @1304 .   Will continue vancomycin maintenance dose of 500 mg IV BID based on [facility] Vancomycin Nomogram Goal trough 15-20 in the setting of meningitis.  Will continue to await LP and wait on vancomycin levels for now in the setting of stable renal function.   Pharmacy will order levels and adjust per renal function as clinically appropriate.  Acyclovir Will continue acyclovir 630 mg (10 mg/kg) IV q 8 hours empirically.  Pharmacy will continue to monitor and adjust per consult.  Height: 5' 4.02" (162.6 cm) Weight: 60.8 kg (134 lb 0.6 oz) IBW/kg (Calculated) : 54.74  Temp (24hrs), Avg:98.8 F (37.1 C), Min:98.1 F (36.7 C), Max:99.5 F (37.5 C)  Recent Labs  Lab 09/11/2019 0858 08/25/2019 1058 08/31/19 0405 2019-09-27 0514  WBC 6.9  --  14.2* 13.7*  CREATININE 0.60  --  0.79 0.71  LATICACIDVEN 2.1* 3.0*  --   --     Estimated Creatinine Clearance: 57.3 mL/min (by C-G formula based on SCr of 0.71 mg/dL).    Allergies  Allergen Reactions  . Sulfamethoxazole-Trimethoprim Other (See Comments)    Sulfa causes tingling  . Morphine     Other reaction(s): Other (See Comments) Morphine causes mind altering.  . Oxycodone-Acetaminophen     Other reaction(s): Other (See Comments) Percocet causes mind altering.  . Sertraline Other (See Comments)    Sertraline causes worsening depression.  . Adhesive [Tape] Rash    Some bandaids cause  rash    Antimicrobials this admission: Azithromycin 5/18 x 1 Ceftriaxone 5/18 >> Vancomycin 5/19 >> Acyclovir 5/19 >>  Dose adjustments this admission: None  Microbiology results: 5/18 Bcx:  NG x 2 days 5/18 MRSA PCR:  Negative 5/18 SARS Coronavirus 2:  Negative  Thank you for allowing pharmacy to be a part of this patient's care.  Gerald Dexter, PharmD September 27, 2019 1:40 PM

## 2019-09-13 NOTE — Death Summary Note (Signed)
DEATH SUMMARY   Patient Details  Name: Belinda Jenkins MRN: [id number] DOB: [birth year]  Admission/Discharge Information   Admit Date:  09/21/2019  Date of Death:  23-Sep-2019  Time of Death:  22:57  Length of Stay: 2  Referring Physician: Adin Hector, MD   Reason(s) for Hospitalization  Acute Hypoxic Respiratory Failure COPD Exacerbation Acute on Chronic HFpEF Acute CVA  Diagnoses  Preliminary cause of death:   Acute Hypoxic Respiratory Failure Secondary Diagnoses (including complications and co-morbidities):  Active Problems:   Acute hypoxemic respiratory failure (HCC) COPD Exacerbation Acute on Chronic HFpEF Acute CVA   Brief Hospital Course (including significant findings, care, treatment, and services provided and events leading to death)   Belinda Jenkins is a 69 y.o. female with history of COPD on chronic oxygen, diabetes, who presented to [facility] ED on Sep 21, 2019 with shortness of breath.  Upon presentation she was noted to severe respiratory distress on arrival.  CXR was consistent with COPD, and BNP was elevated suggesting likely component of diastolic CHF as well.  She was placed on BiPAP.  However while in ED she decompensated, with increased lethargy and concern for ability to protect her airway, requiring emergent intubation.  PCCM admitted pt to the ICU for further workup and treatment of Acute Hypoxic Respiratory Failure in the setting of COPD Exacerbation and Acute on Chronic HFpEF requiring intubation.  Her hospital course was complicated by a left fronto/parietal infarct noted on Head CT.  MRI on 5/19 also showed scattered small acute infarcts in both parietal lobes, left occipital lobe, and right cerebellum suggesting embolic event.   Family meeting was held with pt's husband on 2019/09/23 to discuss acute CVA and very poor prognosis in setting of end stage COPD.  Pt's husband decided to transition to comfort measures and to withdraw all life sustaining  measures.   Pertinent Labs and Studies  Significant Diagnostic Studies DG Chest 1 View  Result Date: 08/19/2019 CLINICAL DATA:  Dyspnea EXAM: CHEST  1 VIEW COMPARISON:  02/25/2018 FINDINGS: Hyperinflation without focal opacity or pleural effusion. Normal cardiomediastinal silhouette with aortic atherosclerosis. No pneumothorax. IMPRESSION: No active disease. Electronically Signed   By: Donavan Foil M.D.   On: 08/19/2019 17:19   CT HEAD WO CONTRAST  Result Date: 08/31/2019 CLINICAL DATA:  Altered mental status yesterday prior to intubation EXAM: CT HEAD WITHOUT CONTRAST TECHNIQUE: Contiguous axial images were obtained from the base of the skull through the vertex without intravenous contrast. COMPARISON:  08/19/2019 FINDINGS: Brain: Moderate area of gray-white differentiation loss along the lateral left frontal lobe. Ventriculomegaly that is generalized and out of proportion to atrophy, with disproportionate subarachnoid spaces. Negative for hemorrhage or masslike finding. Vascular: Atherosclerotic calcification.  No hyperdense vessel. Skull: Normal. Negative for fracture or focal lesion. Sinuses/Orbits: No acute finding.  Bilateral cataract resection Other: These results will be called ASAP to the ordering clinician or representative by the Radiologist Assistant, and communication documented in the PACS or [employer]. IMPRESSION: 1. Moderate acute infarct in the lateral left frontal lobe. 2. 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 Electronically Signed   By: Monte Fantasia M.D.   On: 08/31/2019 11:57   CT Head Wo Contrast  Result Date: 08/19/2019 CLINICAL DATA:  Encephalopathy, lethargic EXAM: CT HEAD WITHOUT CONTRAST TECHNIQUE: Contiguous axial images were obtained from the base of the skull through the vertex without intravenous contrast. COMPARISON:  CT brain 02/08/2018 FINDINGS: Brain: No acute territorial infarction, hemorrhage or intracranial mass is  visualized. Mild atrophy and chronic small vessel ischemic change of the white matter. Moderate ventriculomegaly, similar compared to previous exam, disproportionate to degree of atrophy present. Vascular: No hyperdense vessels.  Carotid vascular calcification. Skull: Normal. Negative for fracture or focal lesion. Sinuses/Orbits: No acute finding. Other: None IMPRESSION: 1. Negative for acute intracranial hemorrhage or acute interval change as compared with 02/08/2018 2. 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 Mild chronic small vessel ischemic change of the white matter Electronically Signed   By: Donavan Foil M.D.   On: 08/19/2019 17:05   CT Angio Chest PE W and/or Wo Contrast  Addendum Date: 09/09/2019   ADDENDUM REPORT: 08/18/2019 15:26 ADDENDUM: The main pulmonary outflow tract measures 3.2 cm which raises question of a degree of pulmonary arterial hypertension. Electronically Signed   By: Lowella Grip III M.D.   On: 08/21/2019 15:26   Result Date: 09/03/2019 CLINICAL DATA:  Shortness of breath EXAM: CT ANGIOGRAPHY CHEST WITH CONTRAST TECHNIQUE: Multidetector CT imaging of the chest was performed using the standard protocol during bolus administration of intravenous contrast. Multiplanar CT image reconstructions and MIPs were obtained to evaluate the vascular anatomy. CONTRAST:  72mL OMNIPAQUE IOHEXOL 350 MG/ML SOLN COMPARISON:  CT angiogram chest February 08, 2018; chest radiograph Aug 30, 2019 FINDINGS: Cardiovascular: There is no demonstrable pulmonary embolus. There is no thoracic aortic aneurysm or dissection. There are multiple foci of great vessel calcification. There are foci of aortic atherosclerosis as well as foci of coronary artery calcification. There is no pericardial effusion or pericardial thickening. Mediastinum/Nodes: There is a 5 mm nodular opacity in the left lobe of the  thyroid which based on consensus guidelines does not warrant additional imaging surveillance. No larger thyroid lesions evident. There is no appreciable thoracic adenopathy. No esophageal lesions evident. Note that the nasogastric tube passes through the esophagus into the stomach. Lungs/Pleura: Endotracheal tube tip is in the distal trachea. No pneumothorax. There is underlying centrilobular emphysematous change. There is chronic atelectatic change in the lingula, unchanged. There is slight bibasilar scarring. No edema present. No new airspace opacity beyond the changes in the lingula. No pleural effusions. Upper Abdomen: There is upper abdominal aortic atherosclerosis. Nasogastric tube tip in stomach. Visualized upper abdominal structures otherwise appear unremarkable. Musculoskeletal: No blastic or lytic bone lesions. There are foci of degenerative change in the thoracic spine. No chest wall lesions evident. A small focus of calcification is noted adjacent to the lateral humeral head, likely supraspinatus calcific tendinosis. Review of the MIP images confirms the above findings. IMPRESSION: 1. No demonstrable pulmonary embolus. No thoracic aortic aneurysm or dissection. There is aortic atherosclerosis as well as multiple foci of great vessel and coronary artery calcification. 2. Chronic apparent atelectasis in the lingula. Underlying centrilobular emphysematous change. No new opacity. 3.  No evident adenopathy. 4.  Endotracheal tube tip in distal trachea.  No pneumothorax. 5.  Nasogastric tube tip is in the stomach. Aortic Atherosclerosis (ICD10-I70.0) and Emphysema (ICD10-J43.9). Electronically Signed: By: Lowella Grip III M.D. On: 08/29/2019 14:57   MR BRAIN WO CONTRAST  Result Date: 08/20/2019 CLINICAL DATA:  Encephalopathy. Additional provided: 69 year old female with history of COPD, diabetes, hypertension, vertigo presenting with altered mental status. EXAM: MRI HEAD WITHOUT CONTRAST TECHNIQUE:  Multiplanar, multiecho pulse sequences of the brain and surrounding structures were obtained without intravenous contrast. COMPARISON:  Head CT 08/19/2019. FINDINGS: Brain: Intermittently motion degraded examination. Unchanged prior examination, there is ventriculomegaly  which is out of proportion to the degree of parenchymal volume loss and findings are suspicious for communicating/normal pressure hydrocephalus. Mild multifocal T2/FLAIR hyperintensity within the cerebral white matter is nonspecific, but consistent with chronic small vessel ischemic disease. There is no acute infarct. No evidence of intracranial mass. No chronic intracranial blood products. No extra-axial fluid collection. No midline shift. Partially empty sella turcica. Vascular: Expected proximal arterial flow voids. Skull and upper cervical spine: No focal marrow lesion identified within the limitations of motion degradation. Sinuses/Orbits: Visualized orbits show no acute finding. Mild ethmoid sinus mucosal thickening. Bilateral mastoid effusions IMPRESSION: 1. Intermittently motion degraded exam. 2. No evidence of acute infarct. 3. Unchanged ventriculomegaly which appears out of proportion to the degree of generalized parenchymal volume loss, and findings again raise suspicion for communicating/normal pressure hydrocephalus. 4. Mild generalized parenchymal atrophy and chronic small vessel ischemic disease. 5. Mild ethmoid sinus mucosal thickening. 6. Bilateral mastoid effusions. Electronically Signed   By: Kellie Simmering DO   On: 08/20/2019 13:42   MR BRAIN W WO CONTRAST  Result Date: 08/31/2019 CLINICAL DATA:  69 year old female with altered mental status. Left frontal lobe infarct identified on head CT earlier today. EXAM: MRI HEAD WITHOUT AND WITH CONTRAST TECHNIQUE: Multiplanar, multiecho pulse sequences of the brain and surrounding structures were obtained without and with intravenous contrast. CONTRAST:  67mL GADAVIST GADOBUTROL 1  MMOL/ML IV SOLN COMPARISON:  Head CT earlier today. Recent brain MRI 08/20/2019, and earlier. FINDINGS: Brain: New since 08/20/2019 is a 10 mm area of restricted diffusion in the left caudate nucleus plus nearby left operculum and middle frontal gyrus cortical and subcortical restricted diffusion (series 5, image 22). Additionally, there is punctate scattered bilateral vertex posterior frontal and parietal lobe restricted diffusion (series 3, image 45). There is also a small area of left occipital pole restricted diffusion (image 29). And lastly, there are 1 or 2 small foci of restricted diffusion in the right cerebellum (series 3, image 19). No abnormal enhancement identified. T2 and FLAIR hyperintensity at the largest areas of involvement with no evidence of hemorrhage. No significant mass effect. Stable ventriculomegaly. Stable gray and white matter signal outside of the areas of abnormal diffusion. No midline shift, evidence of mass lesion, extra-axial collection or acute intracranial hemorrhage. Cervicomedullary junction and pituitary are within normal limits. No dural thickening. Vascular: Major intracranial vascular flow voids are stable. Skull and upper cervical spine: Negative for age visible cervical spine. Stable bone marrow signal. Sinuses/Orbits: Stable and negative orbits. Increased mild paranasal sinus mucosal thickening. Other: Intubated. Oral enteric tube. Small volume retained secretions now in the nasopharynx. Mild bilateral mastoid effusions are stable. Grossly negative visible internal auditory structures. IMPRESSION: 1. Left caudate and left middle frontal gyrus infarct corresponding to the CT finding earlier today. But there are also scattered small acute infarcts in both superior parietal lobes, the left occipital lobe, and the right cerebellum. This pattern suggests a recent embolic event from the heart or proximal aorta. 2. No associated hemorrhage or mass effect. And otherwise stable MRI  appearance of the brain since 08/20/2019. Electronically Signed   By: Genevie Ann M.D.   On: 08/31/2019 23:44   DG Chest Portable 1 View  Result Date: 08/28/2019 CLINICAL DATA:  Status post intubation and orogastric tube placement. Ex-smoker. EXAM: PORTABLE CHEST 1 VIEW COMPARISON:  Earlier today. FINDINGS: Endotracheal tube tip in satisfactory position. Orogastric tube extending into the stomach. Normal sized heart. Clear lungs with normal vascularity. The lungs remain mildly hyperexpanded. Monitor  lead artifact on the left. Lower thoracic spine degenerative changes. IMPRESSION: Endotracheal tube tip in satisfactory position. No acute abnormality. Electronically Signed   By: Claudie Revering M.D.   On: 08/15/2019 11:47   DG Chest Portable 1 View  Result Date: 08/25/2019 CLINICAL DATA:  Shortness of breath EXAM: PORTABLE CHEST 1 VIEW COMPARISON:  Eleven days ago FINDINGS: Normal heart size for technique. There is no edema, consolidation, effusion, or pneumothorax. Large lung volumes with emphysema by 2019 CT. No acute osseous finding. IMPRESSION: 1. No evidence of active disease. 2. COPD. Electronically Signed   By: Monte Fantasia M.D.   On: 08/18/2019 08:57   DG Abd Portable 1 View  Result Date: 08/31/2019 CLINICAL DATA:  Orogastric tube placement. EXAM: PORTABLE ABDOMEN - 1 VIEW COMPARISON:  02/08/2018 FINDINGS: Orogastric 2 tip in the mid to distal stomach and side hole in the proximal to mid stomach. The included bowel gas pattern is normal. Lumbar and lower thoracic spine degenerative changes. Clear lung bases. IMPRESSION: Orogastric tube tip in the mid to distal stomach and side hole in the proximal to mid stomach. Electronically Signed   By: Claudie Revering M.D.   On: 08/22/2019 11:48   ECHOCARDIOGRAM COMPLETE  Result Date: 08/22/2019    ECHOCARDIOGRAM REPORT   Patient Name:   MARIAJOSE NEITZ Date of Exam: 08/21/2019 Medical Rec #:  [id number]           Height:       62.0 in Accession #:    [id number]           Weight:       134.9 lb Date of Birth:  [birth year]          BSA:          1.617 m Patient Age:    79 years            BP:           189/87 mmHg Patient Gender: F                   HR:           98 bpm. Exam Location:  [facility] Procedure: 2D Echo, Cardiac Doppler and Color Doppler Indications:     Pulmonary hypertension 416.8 / I27.2  History:         Patient has prior history of Echocardiogram examinations. COPD;                  Risk Factors:Hypertension.  Sonographer:     Alyse Low Roar Referring Phys:  [id number] CARMEN Veda Canning Diagnosing Phys: Kathlyn Sacramento MD IMPRESSIONS  1. Left ventricular ejection fraction, by estimation, is 50 to 55%. The left ventricle has low normal function. The left ventricle has no regional wall motion abnormalities. Left ventricular diastolic parameters are consistent with Grade I diastolic dysfunction (impaired relaxation). There is the interventricular septum is flattened in systole, consistent with right ventricular pressure overload.  2. Right ventricular systolic function was not well visualized. The right ventricular size is mildly enlarged. Tricuspid regurgitation signal is inadequate for assessing PA pressure.  3. The mitral valve is normal in structure. No evidence of mitral valve regurgitation. No evidence of mitral stenosis.  4. The aortic valve is normal in structure. Aortic valve regurgitation is not visualized. Mild to moderate aortic valve sclerosis/calcification is present, without any evidence of aortic stenosis.  5. The inferior vena cava is normal in size with greater than 50% respiratory variability, suggesting right  atrial pressure of 3 mmHg. FINDINGS  Left Ventricle: Left ventricular ejection fraction, by estimation, is 50 to 55%. The left ventricle has low normal function. The left ventricle has no regional wall motion abnormalities. The left ventricular internal cavity size was normal in size. There is no left ventricular hypertrophy. The interventricular  septum is flattened in systole, consistent with right ventricular pressure overload. Left ventricular diastolic parameters are consistent with Grade I diastolic dysfunction (impaired relaxation). Right Ventricle: The right ventricular size is mildly enlarged. No increase in right ventricular wall thickness. Right ventricular systolic function was not well visualized. Tricuspid regurgitation signal is inadequate for assessing PA pressure. Left Atrium: Left atrial size was normal in size. Right Atrium: Right atrial size was normal in size. Pericardium: There is no evidence of pericardial effusion. Mitral Valve: The mitral valve is normal in structure. Normal mobility of the mitral valve leaflets. No evidence of mitral valve regurgitation. No evidence of mitral valve stenosis. Tricuspid Valve: The tricuspid valve is normal in structure. Tricuspid valve regurgitation is not demonstrated. No evidence of tricuspid stenosis. Aortic Valve: The aortic valve is normal in structure. Aortic valve regurgitation is not visualized. Mild to moderate aortic valve sclerosis/calcification is present, without any evidence of aortic stenosis. Aortic valve mean gradient measures 4.0 mmHg. Aortic valve peak gradient measures 8.9 mmHg. Aortic valve area, by VTI measures 2.24 cm. Pulmonic Valve: The pulmonic valve was normal in structure. Pulmonic valve regurgitation is trivial. No evidence of pulmonic stenosis. Aorta: The aortic root is normal in size and structure. Venous: The inferior vena cava is normal in size with greater than 50% respiratory variability, suggesting right atrial pressure of 3 mmHg. IAS/Shunts: No atrial level shunt detected by color flow Doppler.  LEFT VENTRICLE PLAX 2D LVIDd:         4.51 cm  Diastology LVIDs:         3.32 cm  LV e' lateral:   6.85 cm/s LV PW:         0.96 cm  LV E/e' lateral: 8.6 LV IVS:        1.09 cm  LV e' medial:    4.35 cm/s LVOT diam:     1.90 cm  LV E/e' medial:  13.5 LV SV:         57 LV SV  Index:   35 LVOT Area:     2.84 cm  RIGHT VENTRICLE RV Mid diam:    2.60 cm RV S prime:     10.00 cm/s TAPSE (M-mode): 1.5 cm LEFT ATRIUM             Index       RIGHT ATRIUM          Index LA diam:        2.80 cm 1.73 cm/m  RA Area:     9.05 cm LA Vol (A2C):   32.9 ml 20.34 ml/m RA Volume:   16.40 ml 10.14 ml/m LA Vol (A4C):   38.6 ml 23.87 ml/m LA Biplane Vol: 38.7 ml 23.93 ml/m  AORTIC VALVE                   PULMONIC VALVE AV Area (Vmax):    1.65 cm    PV Vmax:        0.99 m/s AV Area (Vmean):   1.73 cm    PV Peak grad:   3.9 mmHg AV Area (VTI):     2.24 cm    RVOT Peak grad:  4 mmHg AV Vmax:           149.00 cm/s AV Vmean:          92.400 cm/s AV VTI:            0.253 m AV Peak Grad:      8.9 mmHg AV Mean Grad:      4.0 mmHg LVOT Vmax:         86.80 cm/s LVOT Vmean:        56.300 cm/s LVOT VTI:          0.200 m LVOT/AV VTI ratio: 0.79  AORTA Ao Root diam: 2.80 cm MITRAL VALVE MV Area (PHT): 5.97 cm     SHUNTS MV Decel Time: 127 msec     Systemic VTI:  0.20 m MV E velocity: 58.70 cm/s   Systemic Diam: 1.90 cm MV A velocity: 115.00 cm/s MV E/A ratio:  0.51 MV A Prime:    12.7 cm/s Kathlyn Sacramento MD Electronically signed by Kathlyn Sacramento MD Signature Date/Time: 08/22/2019/7:53:17 AM    Final     Microbiology Recent Results (from the past 240 hour(s))  MRSA PCR Screening     Status: None   Collection Time: 08/13/2019  8:32 AM   Specimen: Nasal Mucosa; Nasopharyngeal  Result Value Ref Range Status   MRSA by PCR NEGATIVE NEGATIVE Final    Comment:        The GeneXpert MRSA Assay (FDA approved for NASAL specimens only), is one component of a comprehensive MRSA colonization surveillance program. It is not intended to diagnose MRSA infection nor to guide or monitor treatment for MRSA infections. Performed at [facility], [address] [place], [place] [address]   SARS Coronavirus 2 by RT PCR (hospital order, performed in [facility] hospital lab) Nasopharyngeal Nasopharyngeal  Swab     Status: None   Collection Time: 08/24/2019 11:51 AM   Specimen: Nasopharyngeal Swab  Result Value Ref Range Status   SARS Coronavirus 2 NEGATIVE NEGATIVE Final    Comment: (NOTE) SARS-CoV-2 target nucleic acids are NOT DETECTED. The SARS-CoV-2 RNA is generally detectable in upper and lower respiratory specimens during the acute phase of infection. The lowest concentration of SARS-CoV-2 viral copies this assay can detect is 250 copies / mL. A negative result does not preclude SARS-CoV-2 infection and should not be used as the sole basis for treatment or other patient management decisions.  A negative result may occur with improper specimen collection / handling, submission of specimen other than nasopharyngeal swab, presence of viral mutation(s) within the areas targeted by this assay, and inadequate number of viral copies (<250 copies / mL). A negative result must be combined with clinical observations, patient history, and epidemiological information. Fact Sheet for Patients:   StrictlyIdeas.no Fact Sheet for Healthcare Providers: BankingDealers.co.za This test is not yet approved or cleared  by the [place] FDA and has been authorized for detection and/or diagnosis of SARS-CoV-2 by FDA under an Emergency Use Authorization (EUA).  This EUA will remain in effect (meaning this test can be used) for the duration of the COVID-19 declaration under Section 564(b)(1) of the Act, 21 U.S.C. section 360bbb-3(b)(1), unless the authorization is terminated or revoked sooner. Performed at [facility], [address] [place], [place] [address]   Blood culture (routine x 2)     Status: None (Preliminary result)   Collection Time: 09/09/2019 11:51 AM   Specimen: BLOOD RIGHT ARM  Result Value Ref Range Status  Specimen Description BLOOD RIGHT ARM  Final   Special Requests   Final    BOTTLES DRAWN AEROBIC AND ANAEROBIC Blood  Culture adequate volume   Culture   Final    NO GROWTH 2 DAYS Performed at [facility], [address] [place], [place] [address]    Report Status PENDING  Incomplete  Blood culture (routine x 2)     Status: None (Preliminary result)   Collection Time: 08/28/2019 11:52 AM   Specimen: BLOOD LEFT ARM  Result Value Ref Range Status   Specimen Description BLOOD LEFT ARM  Final   Special Requests   Final    BOTTLES DRAWN AEROBIC AND ANAEROBIC Blood Culture adequate volume   Culture   Final    NO GROWTH 2 DAYS Performed at [facility], [place], [place] [address]    Report Status PENDING  Incomplete    Lab Basic Metabolic Panel: Recent Labs  Lab 08/21/2019 0858 08/31/19 0405 September 17, 2019 0514  NA 135 135 133*  K 5.1 4.5 4.1  CL 85* 85* 87*  CO2 38* 34* 32  GLUCOSE 182* 116* 115*  BUN 29* 29* 40*  CREATININE 0.60 0.79 0.71  CALCIUM 9.2 8.7* 7.7*   Liver Function Tests: Recent Labs  Lab 08/27/2019 0858  AST 35  ALT 41  ALKPHOS 44  BILITOT 1.1  PROT 7.3  ALBUMIN 4.2   No results for input(s): LIPASE, AMYLASE in the last 168 hours. No results for input(s): AMMONIA in the last 168 hours. CBC: Recent Labs  Lab 08/25/2019 0858 08/31/19 0405 Sep 17, 2019 0514  WBC 6.9 14.2* 13.7*  NEUTROABS 6.1  --   --   HGB 11.6* 11.8* 9.9*  HCT 38.7 36.8 31.1*  MCV 92.6 87.4 87.9  PLT 364 377 336   Cardiac Enzymes: No results for input(s): CKTOTAL, CKMB, CKMBINDEX, TROPONINI in the last 168 hours. Sepsis Labs: Recent Labs  Lab 08/15/2019 0858 09/03/2019 1058 08/31/19 0405 17-Sep-2019 0514  WBC 6.9  --  14.2* 13.7*  LATICACIDVEN 2.1* 3.0*  --   --     Procedures/Operations  Intubation 5/18     Darel Hong, AGACNP-BC [facility] Pager: [phone]  Bradly Bienenstock 2019/09/17, 11:44 PM

## 2019-09-13 NOTE — Progress Notes (Signed)
This patient was put on comfort care today. She is currently resting and is in no distress. She appears comfortable. Morphine is running at 2 mg/hr for comfort, pain, and air hunger. The patient's family left for the night and asked this RN to call if any changes occur. Will continue to monitor.   Cameron Ali, RN

## 2019-09-13 DEATH — deceased

## 2019-09-19 ENCOUNTER — Ambulatory Visit: Payer: Medicare Other | Admitting: Adult Health

## 2021-02-26 IMAGING — MR MR HEAD WO/W CM
12 series · 48 of 48 positions shown · IV contrast (gadavist)
Comparison: Head CT earlier today. Recent brain MRI 08/20/2019, and
earlier.

CLINICAL DATA: 68-year-old female with altered mental status. Left
frontal lobe infarct identified on head CT earlier today.

EXAM:
MRI HEAD WITHOUT AND WITH CONTRAST
TECHNIQUE: Multiplanar, multiecho pulse sequences of the brain and surrounding
structures were obtained without and with intravenous contrast.
CONTRAST:  6mL GADAVIST GADOBUTROL 1 MMOL/ML IV SOLN

[Series 3: ax dwi_tracew · axial · 3.0mm · 1.31mm/px · z∈[-65,+66]mm · 6 of 48 slices shown]
[im 1/48]
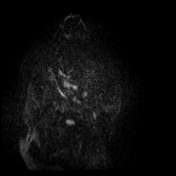
[im 10/48]
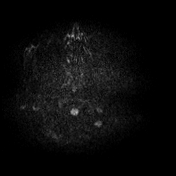
[im 19/48]
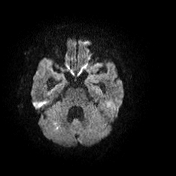
[im 29/48]
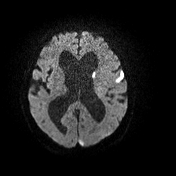
[im 38/48]
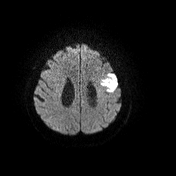
[im 48/48]
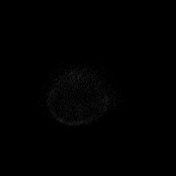

[Series 4: ax dwi_adc · axial · 3.0mm · 1.31mm/px · z∈[-65,+66]mm · 6 of 48 slices shown]
[im 1/48]
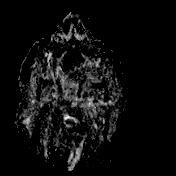
[im 10/48]
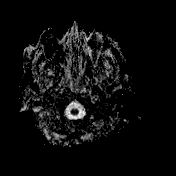
[im 19/48]
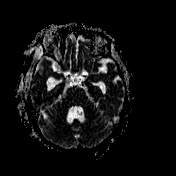
[im 29/48]
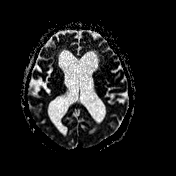
[im 38/48]
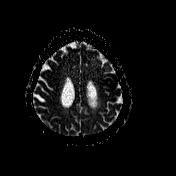
[im 48/48]
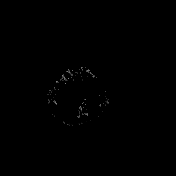

[Series 5: cor dwi_tracew · coronal · 5.0mm · 1.31mm/px · 5 of 38 slices shown]
[im 1/38]
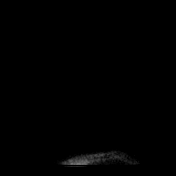
[im 10/38]
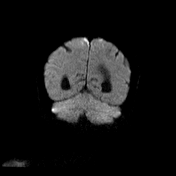
[im 19/38]
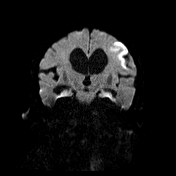
[im 28/38]
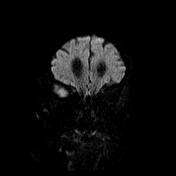
[im 38/38]
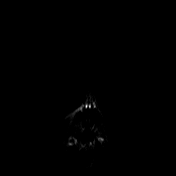

[Series 6: cor dwi_adc · coronal · 5.0mm · 1.31mm/px · 5 of 38 slices shown]
[im 1/38]
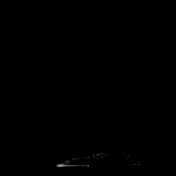
[im 10/38]
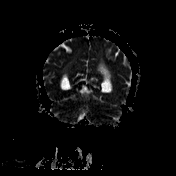
[im 19/38]
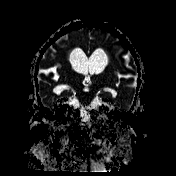
[im 28/38]
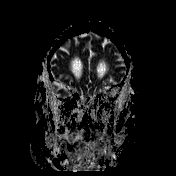
[im 38/38]
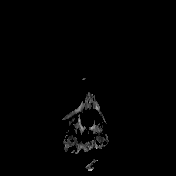

[Series 7: T1 · sagittal · 5.0mm · 0.94mm/px · 3 of 23 slices shown (1 of 2)]
[im 1/23]
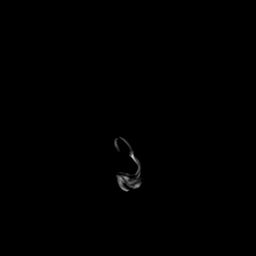
[im 12/23]
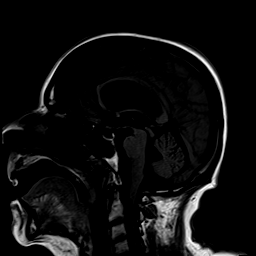
[im 23/23]
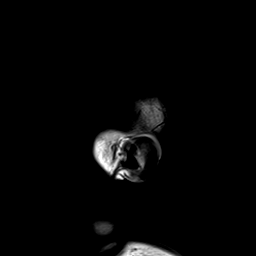

[Series 8: T2 · axial · 5.0mm · 0.45mm/px · z∈[-87,+63]mm · 3 of 27 slices shown (1 of 2)]
[im 1/27]
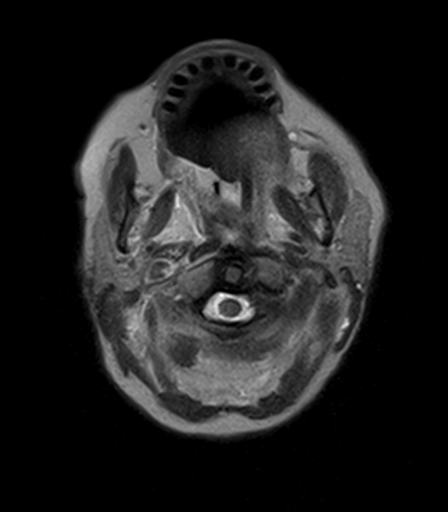
[im 14/27]
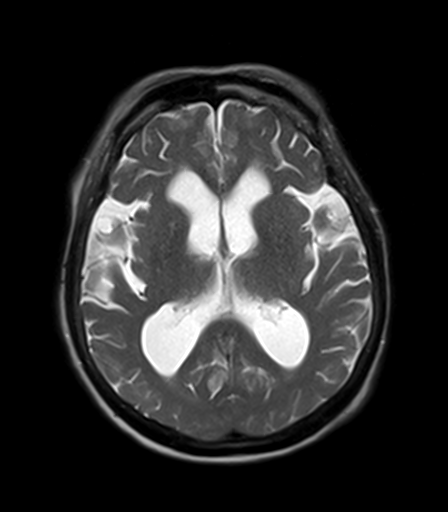
[im 27/27]
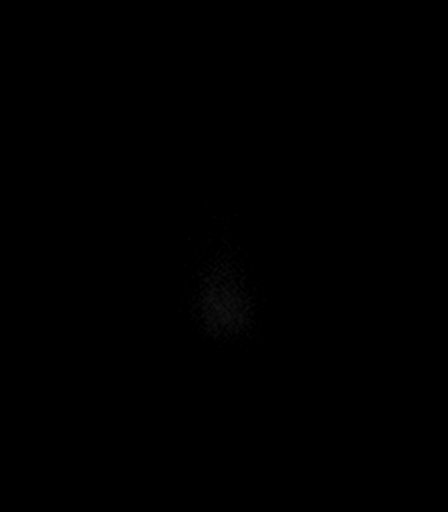

[Series 9: T2-star · axial · 5.0mm · 0.45mm/px · z∈[-87,+63]mm · 3 of 27 slices shown]
[im 1/27]
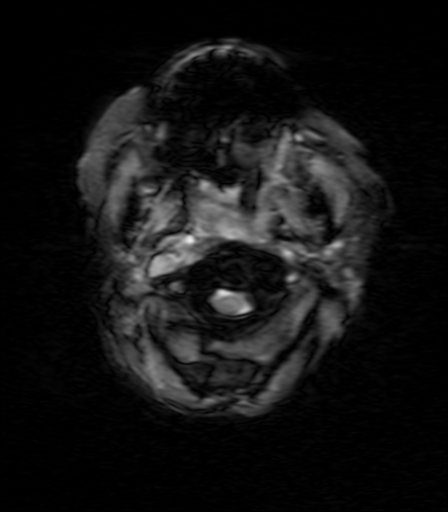
[im 14/27]
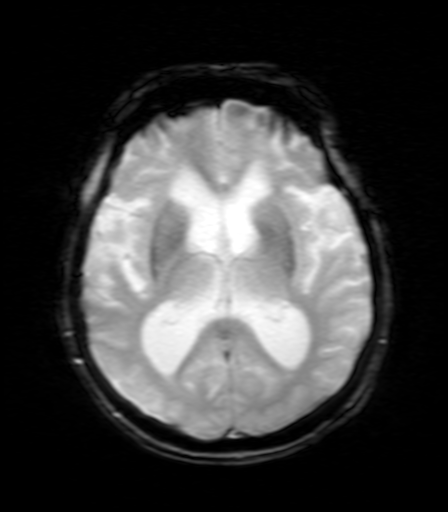
[im 27/27]
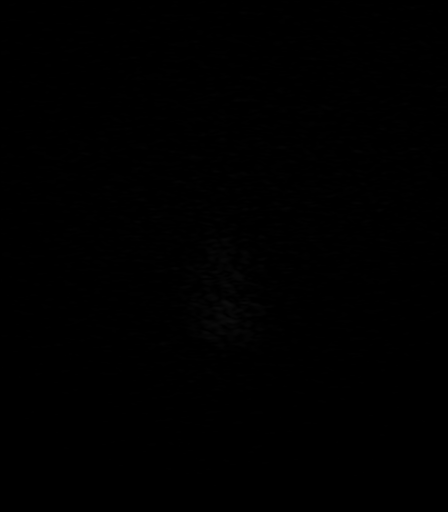

[Series 10: FLAIR · axial · 5.0mm · 1.20mm/px · z∈[-85,+65]mm · 3 of 27 slices shown]
[im 1/27]
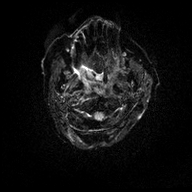
[im 14/27]
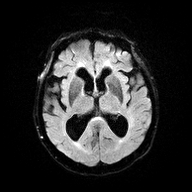
[im 27/27]
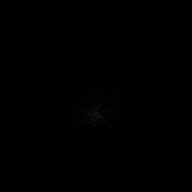

[Series 11: T1 · axial · 5.0mm · 0.90mm/px · z∈[-87,+63]mm · 3 of 27 slices shown (2 of 2)]
[im 1/27]
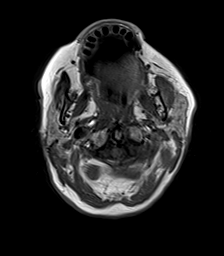
[im 14/27]
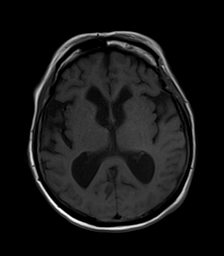
[im 27/27]
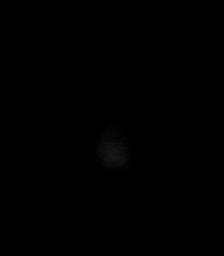

[Series 12: T2 · coronal · 5.0mm · 0.45mm/px · 4 of 31 slices shown (2 of 2)]
[im 1/31]
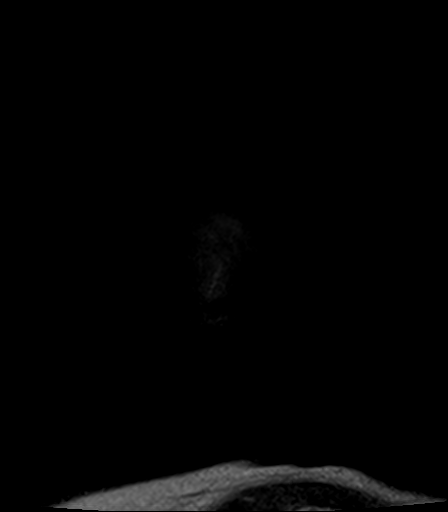
[im 11/31]
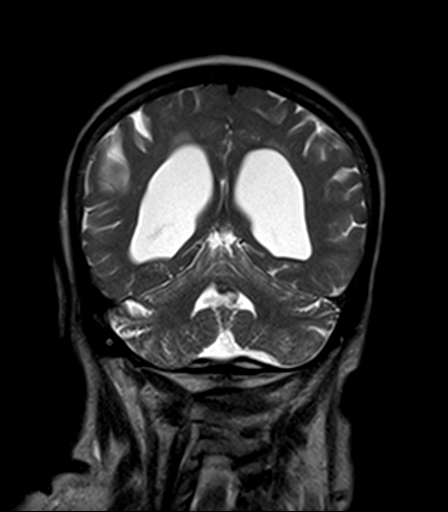
[im 21/31]
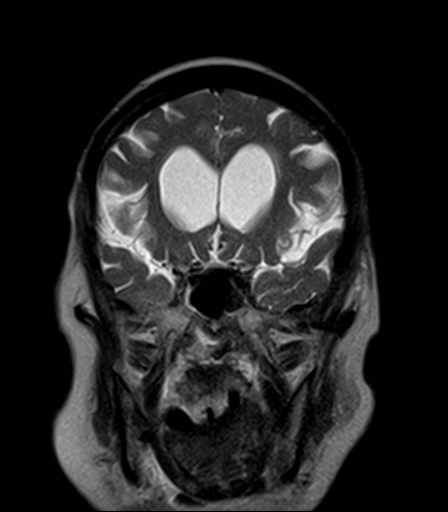
[im 31/31]
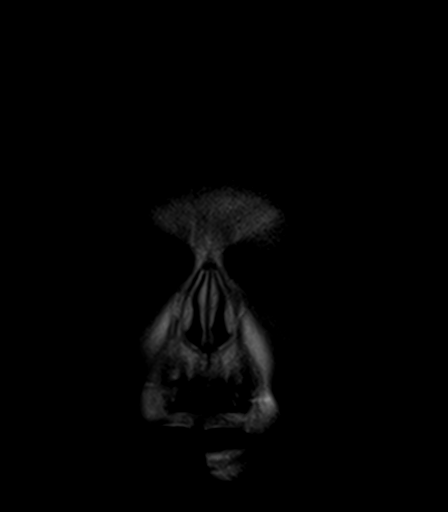

[Series 13: T1 post-contrast · axial · 5.0mm · 0.90mm/px · z∈[-87,+63]mm · 3 of 27 slices shown (1 of 2)]
[im 1/27]
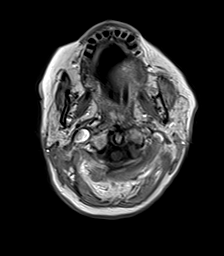
[im 14/27]
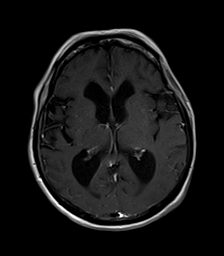
[im 27/27]
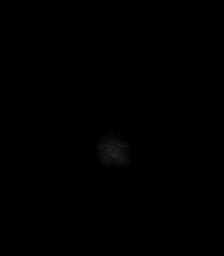

[Series 14: T1 post-contrast · coronal · 5.0mm · 0.90mm/px · 4 of 31 slices shown (2 of 2)]
[im 1/31]
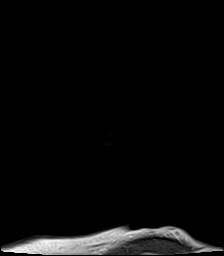
[im 11/31]
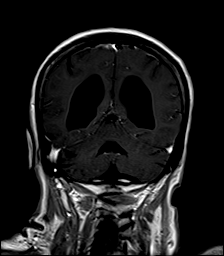
[im 21/31]
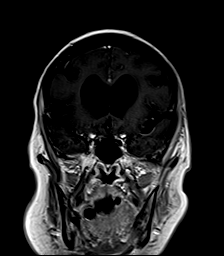
[im 31/31]
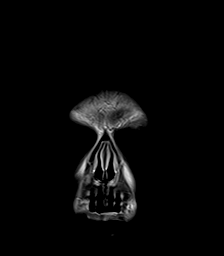

[48 of 48 positions shown; findings below may reference images not displayed]

FINDINGS: Brain: New since 08/20/2019 is a 10 mm area of restricted diffusion
in the left caudate nucleus plus nearby left operculum and middle
frontal gyrus cortical and subcortical restricted diffusion (series
5, image 22). Additionally, there is punctate scattered bilateral
vertex posterior frontal and parietal lobe restricted diffusion
(series 3, image 45). There is also a small area of left occipital
pole restricted diffusion (image 29). And lastly, there are 1 or 2
small foci of restricted diffusion in the right cerebellum (series
3, image 19).

No abnormal enhancement identified. T2 and FLAIR hyperintensity at
the largest areas of involvement with no evidence of hemorrhage. No
significant mass effect.

Stable ventriculomegaly. Stable gray and white matter signal outside
of the areas of abnormal diffusion. No midline shift, evidence of
mass lesion, extra-axial collection or acute intracranial
hemorrhage. Cervicomedullary junction and pituitary are within
normal limits. No dural thickening.

Vascular: Major intracranial vascular flow voids are stable.

Skull and upper cervical spine: Negative for age visible cervical
spine. Stable bone marrow signal.

Sinuses/Orbits: Stable and negative orbits. Increased mild paranasal
sinus mucosal thickening.

Other: Intubated. Oral enteric tube. Small volume retained
secretions now in the nasopharynx. Mild bilateral mastoid effusions
are stable. Grossly negative visible internal auditory structures.
IMPRESSION: 1. Left caudate and left middle frontal gyrus infarct corresponding
to the CT finding earlier today. But there are also scattered small
acute infarcts in both superior parietal lobes, the left occipital
lobe, and the right cerebellum.
This pattern suggests a recent embolic event from the heart or
proximal aorta.

2. No associated hemorrhage or mass effect. And otherwise stable MRI
appearance of the brain since 08/20/2019.
